# Patient Record
Sex: Female | Born: 1945 | Race: White | Hispanic: No | Marital: Single | Smoking: Former smoker
Health system: Southern US, Community
[De-identification: ages and names within clinical notes are randomized; demographics above are authoritative.]

## PROBLEM LIST (undated history)

## (undated) DIAGNOSIS — R519 Headache, unspecified: Secondary | ICD-10-CM

## (undated) DIAGNOSIS — J189 Pneumonia, unspecified organism: Secondary | ICD-10-CM

## (undated) DIAGNOSIS — T884XXA Failed or difficult intubation, initial encounter: Secondary | ICD-10-CM

## (undated) DIAGNOSIS — I341 Nonrheumatic mitral (valve) prolapse: Secondary | ICD-10-CM

## (undated) DIAGNOSIS — G8929 Other chronic pain: Secondary | ICD-10-CM

## (undated) DIAGNOSIS — Z9889 Other specified postprocedural states: Secondary | ICD-10-CM

## (undated) DIAGNOSIS — M259 Joint disorder, unspecified: Secondary | ICD-10-CM

## (undated) DIAGNOSIS — Z973 Presence of spectacles and contact lenses: Secondary | ICD-10-CM

## (undated) DIAGNOSIS — R112 Nausea with vomiting, unspecified: Secondary | ICD-10-CM

## (undated) DIAGNOSIS — I10 Essential (primary) hypertension: Secondary | ICD-10-CM

## (undated) DIAGNOSIS — T8859XA Other complications of anesthesia, initial encounter: Secondary | ICD-10-CM

## (undated) DIAGNOSIS — E039 Hypothyroidism, unspecified: Secondary | ICD-10-CM

## (undated) DIAGNOSIS — C801 Malignant (primary) neoplasm, unspecified: Secondary | ICD-10-CM

## (undated) DIAGNOSIS — G4730 Sleep apnea, unspecified: Secondary | ICD-10-CM

## (undated) DIAGNOSIS — R51 Headache: Secondary | ICD-10-CM

## (undated) DIAGNOSIS — T4145XA Adverse effect of unspecified anesthetic, initial encounter: Secondary | ICD-10-CM

## (undated) HISTORY — PX: ANKLE SURGERY: SHX546

## (undated) HISTORY — PX: APPENDECTOMY: SHX54

## (undated) HISTORY — DX: Headache: R51

## (undated) HISTORY — PX: KNEE SURGERY: SHX244

## (undated) HISTORY — PX: FRACTURE SURGERY: SHX138

## (undated) HISTORY — PX: ABDOMINAL HYSTERECTOMY: SHX81

## (undated) HISTORY — PX: MANDIBLE FRACTURE SURGERY: SHX706

## (undated) HISTORY — PX: DILATION AND CURETTAGE OF UTERUS: SHX78

## (undated) HISTORY — DX: Headache, unspecified: R51.9

---
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
## 1997-05-21 ENCOUNTER — Other Ambulatory Visit: Admission: RE | Admit: 1997-05-21 | Discharge: 1997-05-21 | Payer: Self-pay | Admitting: Orthopedic Surgery

## 1998-02-23 ENCOUNTER — Emergency Department (HOSPITAL_COMMUNITY): Admission: EM | Admit: 1998-02-23 | Discharge: 1998-02-24 | Payer: Self-pay | Admitting: Emergency Medicine

## 1998-02-23 ENCOUNTER — Encounter: Payer: Self-pay | Admitting: Emergency Medicine

## 1998-02-25 ENCOUNTER — Emergency Department (HOSPITAL_COMMUNITY): Admission: EM | Admit: 1998-02-25 | Discharge: 1998-02-25 | Payer: Self-pay | Admitting: Emergency Medicine

## 1998-02-25 ENCOUNTER — Encounter: Payer: Self-pay | Admitting: Emergency Medicine

## 1998-11-03 ENCOUNTER — Other Ambulatory Visit: Admission: RE | Admit: 1998-11-03 | Discharge: 1998-11-03 | Payer: Self-pay | Admitting: *Deleted

## 1999-08-28 ENCOUNTER — Other Ambulatory Visit: Admission: RE | Admit: 1999-08-28 | Discharge: 1999-08-28 | Payer: Self-pay | Admitting: Orthopedic Surgery

## 2001-08-03 ENCOUNTER — Encounter: Admission: RE | Admit: 2001-08-03 | Discharge: 2001-10-10 | Payer: Self-pay

## 2005-09-17 ENCOUNTER — Emergency Department (HOSPITAL_COMMUNITY): Admission: EM | Admit: 2005-09-17 | Discharge: 2005-09-18 | Payer: Self-pay | Admitting: Emergency Medicine

## 2008-09-11 ENCOUNTER — Emergency Department (HOSPITAL_COMMUNITY): Admission: EM | Admit: 2008-09-11 | Discharge: 2008-09-11 | Payer: Self-pay | Admitting: Emergency Medicine

## 2008-09-15 ENCOUNTER — Emergency Department (HOSPITAL_COMMUNITY): Admission: EM | Admit: 2008-09-15 | Discharge: 2008-09-15 | Payer: Self-pay | Admitting: Emergency Medicine

## 2009-01-22 ENCOUNTER — Encounter: Payer: Self-pay | Admitting: Cardiovascular Disease

## 2009-01-24 ENCOUNTER — Encounter (INDEPENDENT_AMBULATORY_CARE_PROVIDER_SITE_OTHER): Payer: Self-pay | Admitting: *Deleted

## 2009-01-29 DIAGNOSIS — I10 Essential (primary) hypertension: Secondary | ICD-10-CM | POA: Insufficient documentation

## 2009-01-29 DIAGNOSIS — M79609 Pain in unspecified limb: Secondary | ICD-10-CM | POA: Insufficient documentation

## 2009-02-04 ENCOUNTER — Ambulatory Visit: Payer: Self-pay | Admitting: Cardiovascular Disease

## 2009-02-07 ENCOUNTER — Encounter (INDEPENDENT_AMBULATORY_CARE_PROVIDER_SITE_OTHER): Payer: Self-pay | Admitting: *Deleted

## 2009-02-10 ENCOUNTER — Ambulatory Visit: Payer: Self-pay | Admitting: Gastroenterology

## 2009-02-27 ENCOUNTER — Telehealth (INDEPENDENT_AMBULATORY_CARE_PROVIDER_SITE_OTHER): Payer: Self-pay | Admitting: *Deleted

## 2009-03-03 ENCOUNTER — Ambulatory Visit: Payer: Self-pay

## 2009-03-03 ENCOUNTER — Encounter (HOSPITAL_COMMUNITY): Admission: RE | Admit: 2009-03-03 | Discharge: 2009-05-13 | Payer: Self-pay | Admitting: Cardiovascular Disease

## 2009-03-03 ENCOUNTER — Encounter: Payer: Self-pay | Admitting: Cardiovascular Disease

## 2009-03-03 ENCOUNTER — Ambulatory Visit: Payer: Self-pay | Admitting: Cardiovascular Disease

## 2009-03-03 ENCOUNTER — Ambulatory Visit (HOSPITAL_COMMUNITY): Admission: RE | Admit: 2009-03-03 | Discharge: 2009-03-03 | Payer: Self-pay | Admitting: Cardiovascular Disease

## 2009-03-10 ENCOUNTER — Ambulatory Visit: Payer: Self-pay | Admitting: Cardiovascular Disease

## 2009-03-10 DIAGNOSIS — I10 Essential (primary) hypertension: Secondary | ICD-10-CM

## 2010-02-10 NOTE — Letter (Signed)
Summary: Previsit letter  [facility]  [address] [place], [place] [address]   Phone: [phone]  Fax: [phone]       01/24/2009 MRN: [id number]  Angela [name] [address] [place], [place]  [address]  Dear [name],  Welcome to the Gastroenterology Division at [facility].    You are scheduled to see a nurse for your pre-procedure visit on 02-07-09 at 9am on the 3rd floor at [facility], [address]  We ask that you try to arrive at our office 15 minutes prior to your appointment time to allow for check-in.  Your nurse visit will consist of discussing your medical and surgical history, your immediate family medical history, and your medications.    Please bring a complete list of all your medications or, if you prefer, bring the medication bottles and we will list them.  We will need to be aware of both prescribed and over the counter drugs.  We will need to know exact dosage information as well.  If you are on blood thinners (Coumadin, Plavix, Aggrenox, Ticlid, etc.) please call our office today/prior to your appointment, as we need to consult with your physician about holding your medication.   Please be prepared to read and sign documents such as consent forms, a financial agreement, and acknowledgement forms.  If necessary, and with your consent, a friend or relative is welcome to sit-in on the nurse visit with you.  Please bring your insurance card so that we may make a copy of it.  If your insurance requires a referral to see a specialist, please bring your referral form from your primary care physician.  No co-pay is required for this nurse visit.     If you cannot keep your appointment, please call [phone] to cancel or reschedule prior to your appointment date.  This allows [name] the opportunity to schedule an appointment for another patient in need of care.    Thank you for choosing [facility] for your medical needs.   We appreciate the opportunity to care for you.  Please visit [name] at our website  to learn more about our practice.                     Sincerely.                                                                                                                   The Gastroenterology Division

## 2010-02-10 NOTE — Assessment & Plan Note (Signed)
Summary: np6/htn/uncontrolled/strong family hx of MI/eval for stress t...   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  History of Present Illness: 65 yo WF with history of multiple jaw surgereis who has recently diagnosed HTN and strong family history of CAD who is referred today for cardiac evaluation. She describes chronic jaw pain. She has also noticed left sided back pain and arm pain with exertion. No associated SOB, diaphoresis, nausea or vomiting, dizziness. NO other complaints. She was found to have elevated blood pressure while seeing Dr. Perrin Maltese two weeks ago and was started on Lisinopril which she started taking 6-7 days ago. She has never been treated for HTN in the past.   She is concerned about the upper back pain and arm pain with exertion as her brother had similar symptoms and died of a massive MI.   Current Medications (verified): 1)  Lisinopril 10 Mg Tabs (Lisinopril) .Marland Kitchen.. 1 Tab Once Daily 2)  Hydrocodone-Acetaminophen 5-500 Mg Tabs (Hydrocodone-Acetaminophen) .... As Needed  Allergies (verified): 1)  ! * Antiflammatories  Past History:  Past Medical History: HYPERTENSION-recent diagnosis 1/11 Chronic jaw pain s/p multiple surgeries.      Past Surgical History: 19 jaw surgeries Hysterectomy D &C x 3  Family History: Mother deceased breast cancer age 70 Father deceased aneurysm age 65 Brother MI age 46  Social History: Reviewed history from 01/29/2009 and no changes required. Tobacco Use - No.  Alcohol Use - yes. 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   Review of Systems       The patient complains of joint pain and chest pain.  The patient denies fatigue, malaise, fever, weight  gain/loss, vision loss, decreased hearing, hoarseness, palpitations, shortness of breath, prolonged cough, wheezing, sleep apnea, coughing up blood, abdominal pain, blood in stool, nausea, vomiting, diarrhea, heartburn, incontinence, blood in urine, muscle weakness, leg swelling, rash, skin lesions, headache, fainting, dizziness, depression, anxiety, enlarged lymph nodes, easy bruising or bleeding, and environmental allergies.         See HPI.   Vital Signs:  Patient profile:   65 year old female Height:      66 inches Weight:      144 pounds BMI:     23.33 Pulse rate:   56 / minute Pulse rhythm:   irregular BP sitting:   162 / 90  (left arm) Cuff size:   large  Vitals Entered By: Danielle Rankin, CMA (February 04, 2009 2:49 PM)  Physical Exam  General:  General: Well developed, well nourished, NAD HEENT: OP clear, mucus membranes moist SKIN: warm, dry Neuro: No focal deficits Musculoskeletal: Muscle strength 5/5 all ext Psychiatric: Mood and affect normal Neck: No JVD, no carotid bruits, no thyromegaly, no lymphadenopathy. Lungs:Clear bilaterally, no wheezes, rhonci, crackles CV: RRR no murmurs, gallops rubs Abdomen: soft, NT, ND, BS present Extremities: No edema, pulses 2+.    EKG  Procedure date:  02/04/2009  Findings:      Sinus bradycardia, rate 56 bpm. Incomplete RBBB. Poor R wave progression through precordial leads. Cannot exclude prior septal infarct.  Impression & Recommendations:  Problem # 1:  HYPERTENSION, UNCONTROLLED (ICD-401.9)  I have ordered a renal artery u/s to exclude renovascular  disease as the cause of her HTN which is new in onset. Will check echo to assess LV size and function. She would likely benefit from titration of the Ace-inhibitor with addition of HCTZ 25 mg QDaily. I will leave the titration of medications to Dr. Perrin Maltese. She is supposed to see him tomorrow.  Her updated medication list for this problem includes:    Lisinopril 10 Mg Tabs  (Lisinopril) .Marland Kitchen... 1 tab once daily  Orders: EKG w/ Interpretation (93000) Nuclear Stress Test (Nuc Stress Test) Echocardiogram (Echo) Renal Artery Duplex (Renal Artery Duplex)  Problem # 2:  ARM PAIN, LEFT (ICD-729.5) With strong family history of CAD and h/o HTN and the abnormal EKG, will get exercise myoview to assess for ischemia.    Patient Instructions: 1)  Your physician recommends that you schedule a follow-up appointment in: 3-4 weeks 2)  Your physician has requested that you have a renal artery duplex. During this test, an ultrasound is used to evaluate blood flow to the kidneys. Allow one hour for this exam. Do not eat after midnight the day before and avoid carbonated beverages. Take your medications as you usually do. 3)  Your physician has requested that you have an echocardiogram.  Echocardiography is a painless test that uses sound waves to create images of your heart. It provides your doctor with information about the size and shape of your heart and how well your heart's chambers and valves are working.  This procedure takes approximately one hour. There are no restrictions for this procedure. 4)  Your physician has requested that you have an exercise stress myoview.  For further information please visit [email or web address]  Please follow instruction sheet, as given.

## 2010-02-10 NOTE — Assessment & Plan Note (Signed)
Summary: per checkout/saf   Visit Type:  Follow-up Primary Provider:  Robert Bellow  CC:  pt states he left arm acts up all the time says she carries a heavy breifcase all the tim and but states her fingers went numb...Marland KitchenMarland KitchenMarland Kitchenedema/legs.  History of Present Illness: 65 yo WF with history of multiple jaw surgereis who has recently diagnosed HTN and strong family history of CAD who was  referred several weeks ago for cardiac evaluation. She described chronic jaw pain. She has also noticed left sided back pain and arm pain with exertion. No associated SOB, diaphoresis, nausea or vomiting, dizziness.  She has recently been placed on an Ace-inhibitor for control of blood pressure. She is here today to review her stress test and echo results. We also ordered a renal artery doppler with her recent onset of HTN.   She has had no recurrence of chest pain or SOB. She has had some left arm pain which she thinks is from carrying her briefcase. She continues to have severe chronic jaw pain.   Current Medications (verified): 1)  Lisinopril-Hydrochlorothiazide 10-12.5 Mg Tabs (Lisinopril-Hydrochlorothiazide) .Marland Kitchen.. 1 Tab Once Daily 2)  Hydrocodone-Acetaminophen 5-500 Mg Tabs (Hydrocodone-Acetaminophen) .... As Needed 3)  Moviprep 100 Gm  Solr (Peg-Kcl-Nacl-Nasulf-Na Asc-C) .... As Per Prep Instructions. 4)  Aspirin 81 Mg Tbec (Aspirin) .... Take One Tablet By Mouth Daily 5)  Multivitamins   Tabs (Multiple Vitamin) .Marland Kitchen.. 1 Tab Once Daily  Allergies: 1)  ! * Antiflammatories  Past History:  Past Medical History: Reviewed history from 02/04/2009 and no changes required. HYPERTENSION-recent diagnosis 1/11 Chronic jaw pain s/p multiple surgeries.      Social History: Reviewed history from 02/04/2009 and no changes required. Tobacco Use - No.  Alcohol Use - yes. 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   Review of  Systems       The patient complains of joint pain.  The patient denies fatigue, malaise, fever, weight gain/loss, vision loss, decreased hearing, hoarseness, chest pain, palpitations, shortness of breath, prolonged cough, wheezing, sleep apnea, coughing up blood, abdominal pain, blood in stool, nausea, vomiting, diarrhea, heartburn, incontinence, blood in urine, muscle weakness, leg swelling, rash, skin lesions, headache, fainting, dizziness, depression, anxiety, enlarged lymph nodes, easy bruising or bleeding, and environmental allergies.    Vital Signs:  Patient profile:   65 year old female Height:      66 inches Weight:      136 pounds BMI:     22.03 Pulse rate:   71 / minute Pulse rhythm:   regular BP sitting:   125 / 84  (left arm) Cuff size:   large  Vitals Entered By: Danielle Rankin, CMA (March 10, 2009 9:06 AM)  Physical Exam  General:  General: Well developed, well nourished, NAD Musculoskeletal: Muscle strength 5/5 all ext Psychiatric: Mood and affect normal Neck: No JVD, no carotid bruits, no thyromegaly, no lymphadenopathy. Lungs:Clear bilaterally, no wheezes, rhonci, crackles CV: RRR no murmurs, gallops rubs Abdomen: soft, NT, ND, BS present Extremities: No edema, pulses 2+.    Nuclear ETT  Procedure date:  03/03/2009  Findings:      The patient exercised for nine minutes.  The patient stopped due to fatigue.  She did c/o chest heaviness, 3/10, with exercise.  There were no diagnostic ST-T wave changes, only occasional PAC's and PVC's with rare couplet.  Myoview was injected at peak exercise and myocardial perfusion  imaging was performed after a brief delay.  QPS  Raw Data Images:  Normal; no motion artifact; normal heart/lung ratio. Stress Images:  NI: Uniform and normal uptake of tracer in all myocardial segments. Rest Images:  Normal homogeneous uptake in all areas of the myocardium. Subtraction (SDS):  Normal Transient Ischemic Dilatation:  1.03  (Normal  <1.22)  Lung/Heart Ratio:  .23  (Normal <0.45)  Quantitative Gated Spect Images  QGS EDV:  66 ml QGS ESV:  18 ml QGS EF:  73 % QGS cine images:  normal  Findings  Normal nuclear study  Echocardiogram  Procedure date:  03/03/2009  Findings:      - Left ventricle: The cavity size was normal. Systolic function was       normal. The estimated ejection fraction was in the range of 55% to       60%. Wall motion was normal; there were no regional wall motion       abnormalities.     - Mitral valve: Mild regurgitation.     - Pericardium, extracardiac: A trivial pericardial effusion was       identified.  Arterial Doppler  Procedure date:  03/03/2009  Findings:      Normal kidney size bilaterally. Normal renal arteries.   Impression & Recommendations:  Problem # 1:  ARM PAIN, LEFT (ICD-729.5) Most likely musculoskeletal. No recurrence of any chest pain. Stress test with good exercise tolerance and no ischemia. Echo with no significant valvular issues. Normal LV function. No further cardiac workup at this time.   Problem # 2:  HYPERTENSION, BENIGN (ICD-401.1) Controlled on current therapy. No evidence of renal artery stenosis in dopplers.   Her updated medication list for this problem includes:    Lisinopril-hydrochlorothiazide 10-12.5 Mg Tabs (Lisinopril-hydrochlorothiazide) .Marland Kitchen... 1 tab once daily    Aspirin 81 Mg Tbec (Aspirin) .Marland Kitchen... Take one tablet by mouth daily  Patient Instructions: 1)  Your physician recommends that you schedule a follow-up appointment as needed

## 2010-02-10 NOTE — Miscellaneous (Signed)
Summary: [facility] Previsit/prep  Clinical Lists Changes  Medications: Added new medication of MOVIPREP 100 GM  SOLR (PEG-KCL-NACL-NASULF-NA ASC-C) As per prep instructions. - Signed Rx of MOVIPREP 100 GM  SOLR (PEG-KCL-NACL-NASULF-NA ASC-C) As per prep instructions.;  #1 x 0;  Signed;  Entered by: [name] RN;  Authorized by: Louis Meckel MD;  Method used: Electronically to [facility] [phone] [place], [place], [place]  [address], Ph: [phone], Fax: [phone] Allergies: Changed allergy or adverse reaction from * ANTIFLAMMATORIES to * ANTIFLAMMATORIES Observations: Added new observation of ALLERGY REV: Done (02/10/2009 13:36)    Prescriptions: MOVIPREP 100 GM  SOLR (PEG-KCL-NACL-NASULF-NA ASC-C) As per prep instructions.  #1 x 0   Entered by:   [name] RN   Authorized by:   Louis Meckel MD   Signed by:   [name] RN on 02/10/2009   Method used:   Electronically to        [facility] [address] (retail)       [phone]       [place], [place]  [address]       Ph: [phone]       Fax: [phone]   RxID:   [id number]

## 2010-02-10 NOTE — Letter (Signed)
Summary: [facility] Instructions  [facility]  [address] [place], [place] [address]   Phone: [phone]  Fax: [phone]       Angela [name]    Feb 26, 1945    MRN: [id number]        Procedure Day /Date:  Friday 02/21/09     Arrival Time:  7:30am     Procedure Time:  8:30am     Location of Procedure:                    [facility] (4th Floor)                        PREPARATION FOR COLONOSCOPY WITH MOVIPREP   Starting 5 days prior to your procedure   Sunday 02/06  do not eat nuts, seeds, popcorn, corn, beans, peas,  salads, or any raw vegetables.  Do not take any fiber supplements (e.g. Metamucil, Citrucel, and Benefiber).  THE DAY BEFORE YOUR PROCEDURE         DATE:  02/10  DAY: Thursday  1.  Drink clear liquids the entire day-NO SOLID FOOD  2.  Do not drink anything colored red or purple.  Avoid juices with pulp.  No orange juice.  3.  Drink at least 64 oz. (8 glasses) of fluid/clear liquids during the day to prevent dehydration and help the prep work efficiently.  CLEAR LIQUIDS INCLUDE: Water Jello Ice Popsicles Tea (sugar ok, no milk/cream) Powdered fruit flavored drinks Coffee (sugar ok, no milk/cream) Gatorade Juice: apple, white grape, white cranberry  Lemonade Clear bullion, consomm, broth Carbonated beverages (any kind) Strained chicken noodle soup Hard Candy                             4.  In the morning, mix first dose of MoviPrep solution:    Empty 1 Pouch A and 1 Pouch B into the disposable container    Add lukewarm drinking water to the top line of the container. Mix to dissolve    Refrigerate (mixed solution should be used within 24 hrs)  5.  Begin drinking the prep at 5:00 p.m. The MoviPrep container is divided by 4 marks.   Every 15 minutes drink the solution down to the next mark (approximately 8 oz) until the full liter is complete.   6.  Follow completed prep with 16 oz of clear liquid of your choice  (Nothing red or purple).  Continue to drink clear liquids until bedtime.  7.  Before going to bed, mix second dose of MoviPrep solution:    Empty 1 Pouch A and 1 Pouch B into the disposable container    Add lukewarm drinking water to the top line of the container. Mix to dissolve    Refrigerate  THE DAY OF YOUR PROCEDURE      DATE:  02/11 DAY: Friday  Beginning at  3:30 a.m. (5 hours before procedure):         1. Every 15 minutes, drink the solution down to the next mark (approx 8 oz) until the full liter is complete.  2. Follow completed prep with 16 oz. of clear liquid of your choice.    3. You may drink clear liquids until 6:30am  (2 HOURS BEFORE PROCEDURE).   MEDICATION INSTRUCTIONS  Unless otherwise instructed, you should take regular prescription medications with a small sip of water  as early as possible the morning of your procedure.         OTHER INSTRUCTIONS  You will need a responsible adult at least 64 years of age to accompany you and drive you home.   This person must remain in the waiting room during your procedure.  Wear loose fitting clothing that is easily removed.  Leave jewelry and other valuables at home.  However, you may wish to bring a book to read or  an iPod/MP3 player to listen to music as you wait for your procedure to start.  Remove all body piercing jewelry and leave at home.  Total time from sign-in until discharge is approximately 2-3 hours.  You should go home directly after your procedure and rest.  You can resume normal activities the  day after your procedure.  The day of your procedure you should not:   Drive   Make legal decisions   Operate machinery   Drink alcohol   Return to work  You will receive specific instructions about eating, activities and medications before you leave.    The above instructions have been reviewed and explained to me by   [name] RN  February 10, 2009 2:15 PM     I fully  understand and can verbalize these instructions _____________________________ Date _________

## 2010-02-10 NOTE — Progress Notes (Signed)
Summary: Nuclear Pre-Procedure  Phone Note Outgoing Call Call back at [facility] Phone [phone]   Call placed by: Stanton Kidney, EMT-P,  February 27, 2009 2:19 PM Action Taken: Phone Call Completed Summary of Call: Left message with information on Myoview Information Sheet (see scanned document for details).      Nuclear Med Background Indications for Stress Test: Evaluation for Ischemia   History: Myocardial Perfusion Study  History Comments: '98 MPS: ([facility]-no report)     Nuclear Pre-Procedure Cardiac Risk Factors: Family History - CAD, Hypertension, RBBB Height (in): 66

## 2010-02-10 NOTE — Assessment & Plan Note (Signed)
Summary: Cardiology Nuclear Study  Nuclear Med Background Indications for Stress Test: Evaluation for Ischemia   History: Myocardial Perfusion Study  History Comments: '98 MPS:OK per patient  Symptoms: Fatigue, Palpitations  Symptoms Comments: c/o back pain and (L) arm numbness. Last episode of back pain:this am   Nuclear Pre-Procedure Cardiac Risk Factors: Family History - CAD, History of Smoking, Hypertension, RBBB Caffeine/Decaff Intake: None NPO After: 8:00 PM Lungs: Clear IV 0.9% NS with Angio Cath: 20g     IV Site: (R) AC IV Started by: Stanton Kidney EMT-P Chest Size (in) 38     Cup Size DD     Height (in): 66 Weight (lb): 137 BMI: 22.19  Nuclear Med Study 1 or 2 day study:  1 day     Stress Test Type:  Stress Reading MD:  Charlton Haws, MD     Referring MD:  Verne Carrow, MD Resting Radionuclide:  Technetium 30m Tetrofosmin     Resting Radionuclide Dose:  11.0 mCi  Stress Radionuclide:  Technetium 24m Tetrofosmin     Stress Radionuclide Dose:  33.0 mCi   Stress Protocol Exercise Time (min):  9:00 min     Max HR:  157 bpm     Predicted Max HR:  158 bpm  Max Systolic BP: 189 mm Hg     Percent Max HR:  99.37 %     METS: 10.4 Rate Pressure Product:  47829    Stress Test Technologist:  Rea College CMA-N     Nuclear Technologist:  Burna Mortimer Deal RT-N  Rest Procedure  Myocardial perfusion imaging was performed at rest 45 minutes following the intravenous administration of Myoview Technetium 64m Tetrofosmin.  Stress Procedure  The patient exercised for nine minutes.  The patient stopped due to fatigue.  She did c/o chest heaviness, 3/10, with exercise.  There were no diagnostic ST-T wave changes, only occasional PAC's and PVC's with rare couplet.  Myoview was injected at peak exercise and myocardial perfusion imaging was performed after a brief delay.  QPS Raw Data Images:  Normal; no motion artifact; normal heart/lung ratio. Stress Images:  NI: Uniform and normal  uptake of tracer in all myocardial segments. Rest Images:  Normal homogeneous uptake in all areas of the myocardium. Subtraction (SDS):  Normal Transient Ischemic Dilatation:  1.03  (Normal <1.22)  Lung/Heart Ratio:  .23  (Normal <0.45)  Quantitative Gated Spect Images QGS EDV:  66 ml QGS ESV:  18 ml QGS EF:  73 % QGS cine images:  normal  Findings Normal nuclear study      Overall Impression  Exercise Capacity: Good exercise capacity. BP Response: Normal blood pressure response. Clinical Symptoms: Chest Heavy ECG Impression: No significant ST segment change suggestive of ischemia. Overall Impression: Normal stress nuclear study. Overall Impression Comments: Normal  Appended Document: Cardiology Nuclear Study REviewed and ok. Can we call about this and echo? cdm  Appended Document: Cardiology Nuclear Study Dr. Clifton James reviewed with pt at appt. 03/10/09

## 2010-03-12 ENCOUNTER — Encounter (HOSPITAL_COMMUNITY)
Admission: RE | Admit: 2010-03-12 | Discharge: 2010-03-12 | Disposition: A | Discharge status: Home / Self Care | Payer: BC Managed Care – PPO | Source: Ambulatory Visit | Attending: Orthopedic Surgery | Admitting: Orthopedic Surgery

## 2010-03-12 ENCOUNTER — Other Ambulatory Visit (HOSPITAL_COMMUNITY): Payer: Self-pay | Admitting: Orthopedic Surgery

## 2010-03-12 ENCOUNTER — Ambulatory Visit (HOSPITAL_COMMUNITY)
Admission: RE | Admit: 2010-03-12 | Discharge: 2010-03-12 | Disposition: A | Discharge status: Home / Self Care | Payer: BC Managed Care – PPO | Source: Ambulatory Visit | Attending: Orthopedic Surgery | Admitting: Orthopedic Surgery

## 2010-03-12 ENCOUNTER — Telehealth (INDEPENDENT_AMBULATORY_CARE_PROVIDER_SITE_OTHER): Payer: Self-pay | Admitting: *Deleted

## 2010-03-12 DIAGNOSIS — Z01818 Encounter for other preprocedural examination: Secondary | ICD-10-CM | POA: Insufficient documentation

## 2010-03-12 DIAGNOSIS — Z01810 Encounter for preprocedural cardiovascular examination: Secondary | ICD-10-CM | POA: Insufficient documentation

## 2010-03-12 DIAGNOSIS — M19071 Primary osteoarthritis, right ankle and foot: Secondary | ICD-10-CM

## 2010-03-12 DIAGNOSIS — Z01812 Encounter for preprocedural laboratory examination: Secondary | ICD-10-CM | POA: Insufficient documentation

## 2010-03-12 DIAGNOSIS — I10 Essential (primary) hypertension: Secondary | ICD-10-CM | POA: Insufficient documentation

## 2010-03-12 LAB — COMPREHENSIVE METABOLIC PANEL
Albumin: 4.5 g/dL (ref 3.5–5.2)
Alkaline Phosphatase: 41 U/L (ref 39–117)
BUN: 13 mg/dL (ref 6–23)
Chloride: 102 mEq/L (ref 96–112)
Glucose, Bld: 87 mg/dL (ref 70–99)
Potassium: 4.4 mEq/L (ref 3.5–5.1)
Total Bilirubin: 0.8 mg/dL (ref 0.3–1.2)

## 2010-03-12 LAB — SURGICAL PCR SCREEN: MRSA, PCR: NEGATIVE

## 2010-03-12 LAB — CBC
HCT: 37 % (ref 36.0–46.0)
Hemoglobin: 12.7 g/dL (ref 12.0–15.0)
MCH: 32.8 pg (ref 26.0–34.0)
MCHC: 34.3 g/dL (ref 30.0–36.0)

## 2010-03-13 ENCOUNTER — Ambulatory Visit (HOSPITAL_COMMUNITY)
Admission: RE | Admit: 2010-03-13 | Discharge: 2010-03-13 | Disposition: A | Discharge status: Home / Self Care | Payer: BC Managed Care – PPO | Source: Ambulatory Visit | Attending: Orthopedic Surgery | Admitting: Orthopedic Surgery

## 2010-03-13 DIAGNOSIS — I10 Essential (primary) hypertension: Secondary | ICD-10-CM | POA: Insufficient documentation

## 2010-03-13 DIAGNOSIS — M24873 Other specific joint derangements of unspecified ankle, not elsewhere classified: Secondary | ICD-10-CM | POA: Insufficient documentation

## 2010-03-13 DIAGNOSIS — IMO0001 Reserved for inherently not codable concepts without codable children: Secondary | ICD-10-CM | POA: Insufficient documentation

## 2010-03-13 DIAGNOSIS — M24876 Other specific joint derangements of unspecified foot, not elsewhere classified: Secondary | ICD-10-CM | POA: Insufficient documentation

## 2010-03-13 DIAGNOSIS — Z01812 Encounter for preprocedural laboratory examination: Secondary | ICD-10-CM | POA: Insufficient documentation

## 2010-03-19 NOTE — Progress Notes (Signed)
Summary: Records Request  Faxed OV, EKG, Stress & Echo to Clydie Braun (per [place]) at [facility] ([phone]). Debby Freiberg  March 12, 2010 1:46 PM

## 2010-03-29 NOTE — Op Note (Signed)
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  PROCEDURE: 1. GOLD modified Brostrom reconstruction of the lateral ankle     ligaments, right ankle. 2. Arthroscopic debridement and impingement, right ankle.  SURGEON:  Nadara Mustard, MD  ANESTHESIA:  Popliteal block.  ESTIMATED BLOOD LOSS:  Minimal.  ANTIBIOTICS:  Kefzol 1 g.  DRAINS:  None.  COMPLICATIONS:  None.  DISPOSITION:  To PACU in stable condition.  INDICATIONS FOR PROCEDURE:  The patient is a 65 year old woman with chronic lateral ankle instability.  She has pain with activities of daily living, has failed conservative care, and presents at this time for arthroscopic intervention for debridement of the impingement of the ankle as well as lateral ankle reconstruction.  Risks and benefits were discussed including infection, neurovascular injury, persistent pain, need for additional surgery.  The patient states she understands and wish to proceed at this time.  DESCRIPTION OF PROCEDURE:  The patient was brought to OR room 10 after undergoing a popliteal block.  After adequate level of anesthesia was obtained, the patient's right lower extremity was prepped using DuraPrep and draped into a sterile field.  Attention was first focused over the anterior talofibular ligament, and a curvilinear incision was made over the distal border of the anterior fibula.   This was carried bluntly down.  The extensor retinaculum was retracted.  The anterior talofibular ligament was incised and was then imbricated using 2-0 Ethibond.  After this was stabilized, the extensor ligament was then pulled over the repair and this was sutured in place with the 2-0 Ethibond as well. Previous reconstruction, the patient had positive anterior drawer.  Post reconstruction, she had about 2 mm of an anterior drawer.  The wound was irrigated with normal saline, and the skin was closed with modified vertical mattress suture with 2-0 nylon.  Attention was then focused on the ankle.  The scope was inserted through the anteromedial portal and anterolateral portal was established.  The skin was incised.  Blunt dissection was carried down to capsular and a blunt trocar was used to enter the joint.  On examination, the patient had a significant amount of synovitis anteriorly.  This was debrided with the ArthroCare wand. There was no evidence of any articular cartilage defects.  The medial and lateral gutters were cleansed.  Anterior aspect of the joint was cleansed.  The instruments were removed.  The portals were closed using 4-0 nylon.  The wounds were covered with Adaptic orthopedic sponges, Kerlix, and Coban.  The patient was then taken to PACU in stable condition.  She was given a fracture boot,  crutches, prescription for Tylox for pain, ice and elevation, weightbearing as tolerated, follow up in the office in 1 week.     Nadara Mustard, MD     MVD/MEDQ  D:  03/13/2010  T:  03/14/2010  Job:  [id number]  Electronically Signed by Aldean Baker MD on 03/29/2010 04:31:28 PM

## 2010-04-17 LAB — BASIC METABOLIC PANEL
BUN: 9 mg/dL (ref 6–23)
Chloride: 105 mEq/L (ref 96–112)
GFR calc non Af Amer: >60 mL/min (ref >60)
Potassium: 3.9 mEq/L (ref 3.5–5.1)
Sodium: 140 mEq/L (ref 135–145)

## 2010-04-17 LAB — CBC
HCT: 39.3 % (ref 36.0–46.0)
Hemoglobin: 13.4 g/dL (ref 12.0–15.0)
MCV: 99.4 fL (ref 78.0–100.0)
Platelets: 251 10*3/uL (ref 150–400)
RBC: 3.96 MIL/uL (ref 3.87–5.11)
WBC: 6 10*3/uL (ref 4.0–10.5)

## 2010-04-17 LAB — POCT CARDIAC MARKERS
CKMB, poc: <1 ng/mL — ABNORMAL LOW (ref 1.0–8.0)
Myoglobin, poc: 44.4 ng/mL (ref 12–200)
Troponin i, poc: <0.05 ng/mL (ref 0.00–0.09)

## 2010-04-17 LAB — DIFFERENTIAL
Eosinophils Absolute: 0 10*3/uL (ref 0.0–0.7)
Eosinophils Relative: 1 % (ref 0–5)
Lymphocytes Relative: 31 % (ref 12–46)
Lymphs Abs: 1.9 10*3/uL (ref 0.7–4.0)
Monocytes Absolute: 0.5 10*3/uL (ref 0.1–1.0)
Monocytes Relative: 9 % (ref 3–12)

## 2010-05-29 NOTE — Consult Note (Signed)
Angela [name], NEAL NO.:  [id number]   MEDICAL RECORD NO.:  [id number]                   PATIENT TYPE:  REC   LOCATION:  TPC                                  FACILITY:  [facility]   PHYSICIAN:  Sondra Come, D.O.                 DATE OF BIRTH:  June 18, 1945   DATE OF CONSULTATION:  08/07/2001  DATE OF DISCHARGE:                  PHYSICAL MEDICINE & REHABILITATION CONSULTATION   Thank you very much for kindly referring the [facility] to [facility] for [facility].  The patient was seen in our  clinic today.  Please refer to the following for details regarding the  history, physical examination, and treatment recommendations.  Once again,  thank you for allowing [name] to participate in the care of her.   CHIEF COMPLAINT:  Pain, needs medications changed.   HISTORY OF PRESENT ILLNESS:  The patient is a pleasant 65 year old female  who has a long history of temporomandibular joint pain who is status post 19  surgeries per her report.  She has had chronic jaw, neck, and upper back  pain, but now complains of pain diffusely throughout her body at times.  She  is followed primarily by her primary care Angela [name], Dr. Robert Bellow.  She  states she was previously followed by [facility], Dr.  Daphine [name] who had been prescribing Neurontin and Ultram for her pain.  She has  been on a similar dose of each since 1995.  She currently takes Neurontin  800 mg q.i.d. and Ultram 50 mg q.i.d.  She is noting that the Ultram does  not last as long and when it wears off she tends to get pain throughout her  entire body.  She was referred by her primary care Angela [name] to Dr. [name]  who has thoroughly worked her up neurologically.  In addition, patient feels  like the medications she is currently on have caused her some memory  problems.  She has had appropriate laboratory work-up by her primary care  Angela [name] including a negative TSH,  ESR, ANA, rheumatoid factor.  She has had  multiple sessions of physical therapy previously following her surgeries.  Primarily, this was concentrated on her upper back and neck.  Her current  exercise program includes walking and some stretching.  She has not had any  other type of treatments including acupuncture and massage therapy.  She  rates her pain as a 9/10 on a subjective scale and describes it as constant,  throbbing, sharp and burning affecting her neck, low back, but at times  diffusely throughout her body including her upper and lower extremities.  Her function and quality of life indexes have declined.  Her sleep is poor.  She has tried Elavil 25 mg at bedtime, but was not able to tolerate it.  She  has also had trial of TENS unit and ultrasound in the past without any  significant improvement.  She is allergic to anti-inflammatory medication  which she states causes pseudo porphyria.  She denies bowel and bladder  dysfunction, but admits to unexplained fatigue, occasional nausea and  vomiting with severe pain, headaches, heart murmur, and mitral valve  prolapse.   PAST MEDICAL HISTORY:  Heart murmur, mitral valve prolapse.   PAST SURGICAL HISTORY:  Jaw surgery x19 with implants and rib graft per Dr.  Nonnie Done in [place], [place], hysterectomy, left knee surgery.   FAMILY HISTORY:  Heart disease, cancer, hypertension.   SOCIAL HISTORY:  The patient denies smoking.  Admits to occasional alcohol  use.  She denies drug use.  She is divorced and was working until June 22, 2001.  She previously was a Economist of a company and states that she had a  lot of stress involved with her job but she denies any traumatic life  experiences.   ALLERGIES:  ANTI-INFLAMMATORY MEDICATION.   MEDICATIONS:  1. Neurontin 800 mg q.i.d.  2. Ultram 50 mg q.i.d.  3. Multivitamin.   PHYSICAL EXAMINATION:  GENERAL:  Healthy female in no acute distress.  There  were no asymmetries  about the face.  BACK:  Also does not reveal any asymmetries.  She has full range of motion  of her cervical spine and lumbar spine with normal cervical and lumbar  lordosis.  Pelvis is level.  Shoulder height is level.  There is no  scoliosis noted.  MUSCULOSKELETAL:  There is multiple tender points throughout the body  including the suboccipital, anterior cervical, upper trapezius,  supraspinatus, gluteus medius, greater trochanters, medial knees.  There is  minimal tenderness bilateral lateral epicondyles and sternocostal region.  There is tenderness bilateral temporomandibular joint region.  No crepitus  noted upon opening and closing the mouth.  NEUROLOGIC:  Intact in the upper and lower extremities including motor which  is 5/5 in all muscle groups tested.  Sensory examination is intact to light  touch bilateral upper and lower extremities.  Muscle stretch reflexes are  2+/4 bilateral patellar, medial hamstrings, Achilles, biceps, triceps,  brachioradialis, pronator tares.  Straight leg raise is negative  bilaterally.  Spurling maneuver is negative bilaterally.  Lhermitte's is  negative.  FABER is negative bilaterally.  No heat, erythema, or edema in  the upper or lower extremities.   IMPRESSION:  1. Chronic soft tissue pain syndrome.  Today's examination is consistent     with possible fibromyalgia syndrome as she has diffuse tenderness     throughout the body.  Most of her pain, however, is located more     regionally in the head and neck and upper back.  2. Myofascial pain syndrome.  3. Nonrestorative sleep disorder.   PLAN:  1. Had a long discussion with Angela [name] regarding her pain complaints and     discussion of fibromyalgia syndrome.  We discussed treatment strategies     and expectations.  2. In terms of medications, we discussed possibly weaning her from the    Neurontin and Ultram all together which patient would like to do.  I have     instructed her to wean  Neurontin by one pill every week until she is off.     I have also instructed her to wean Ultram by one pill every three days.     I have counseled her to expect possible rebound pain from coming off of     the medication but I think it is appropriate to give her a trial off  of     these medications.  Neurontin is likely contributing to her memory     problems and would like to see how she does off of the medication.  3. In terms of sleep hygiene, will prescribe trazodone 100 mg one-half to     one p.o. q.h.s.  If patient is unable to tolerate this, would consider     changing to Ambien 5-10 mg at bedtime.  4. Will get patient involved in an aquatic therapy program for range of     motion, stretching, and low impact exercises leading to an independent     program.  This is typically quite helpful for people with fibromyalgia     syndrome.  5. Will refer patient to Dr. Alinda Dooms for a trial of acupuncture for     her TMJ and myofascial pain components.  6. Will refer patient to [facility] for biofeedback,     coping, and behavior modification, etc.  This was discussed at length and     she wished to proceed in this regard.  7. I do not feel like narcotic based analgesia is warranted at this time.     Opiate based pain medication is typically not indicated in fibromyalgia     syndrome or myofascial pain syndrome.  This was discussed with Ms. Mijangos     as well.  8. The patient is to follow up in one month for reevaluation.  9. The patient is instructed to follow up with her primary care Anaiah Mcmannis,     Dr. Perrin Maltese.   The patient was educated on the above findings and recommendations and  understands.  No barriers to communication.                                               Sondra Come, D.O.    JJW/MEDQ  D:  08/04/2001  T:  08/11/2001  Job:  [id number]   cc:   Marlan Palau, M.D.   Jonita Albee, M.D.

## 2010-07-17 ENCOUNTER — Inpatient Hospital Stay (HOSPITAL_COMMUNITY)
Admission: AD | Admit: 2010-07-17 | Discharge: 2010-07-21 | DRG: 219 | Disposition: A | Discharge status: Home / Self Care | Payer: BC Managed Care – PPO | Source: Ambulatory Visit | Attending: Orthopedic Surgery | Admitting: Orthopedic Surgery

## 2010-07-17 DIAGNOSIS — B951 Streptococcus, group B, as the cause of diseases classified elsewhere: Secondary | ICD-10-CM | POA: Diagnosis present

## 2010-07-17 DIAGNOSIS — M009 Pyogenic arthritis, unspecified: Principal | ICD-10-CM | POA: Diagnosis present

## 2010-07-17 DIAGNOSIS — M26609 Unspecified temporomandibular joint disorder, unspecified side: Secondary | ICD-10-CM | POA: Diagnosis present

## 2010-07-17 DIAGNOSIS — I10 Essential (primary) hypertension: Secondary | ICD-10-CM | POA: Diagnosis present

## 2010-07-17 LAB — COMPREHENSIVE METABOLIC PANEL
ALT: 12 U/L (ref 0–35)
AST: 16 U/L (ref 0–37)
CO2: 29 mEq/L (ref 19–32)
Calcium: 8.9 mg/dL (ref 8.4–10.5)
Creatinine, Ser: <0.47 mg/dL — ABNORMAL LOW (ref 0.50–1.10)
Sodium: 132 mEq/L — ABNORMAL LOW (ref 135–145)
Total Protein: 6.3 g/dL (ref 6.0–8.3)

## 2010-07-17 LAB — CBC
Hemoglobin: 11.5 g/dL — ABNORMAL LOW (ref 12.0–15.0)
MCV: 91.4 fL (ref 78.0–100.0)
Platelets: 278 10*3/uL (ref 150–400)
RBC: 3.48 MIL/uL — ABNORMAL LOW (ref 3.87–5.11)
RDW: 12.8 % (ref 11.5–15.5)
WBC: 10.4 10*3/uL (ref 4.0–10.5)

## 2010-07-17 LAB — PROTIME-INR
INR: 0.91 (ref 0.00–1.49)
Prothrombin Time: 12.4 seconds (ref 11.6–15.2)

## 2010-07-17 LAB — APTT: aPTT: 34 seconds (ref 24–37)

## 2010-07-17 LAB — SURGICAL PCR SCREEN: Staphylococcus aureus: NEGATIVE

## 2010-07-18 LAB — BASIC METABOLIC PANEL
Calcium: 8.8 mg/dL (ref 8.4–10.5)
Potassium: 3.3 mEq/L — ABNORMAL LOW (ref 3.5–5.1)
Sodium: 141 mEq/L (ref 135–145)

## 2010-07-18 LAB — CBC
HCT: 31.3 % — ABNORMAL LOW (ref 36.0–46.0)
MCV: 92.9 fL (ref 78.0–100.0)
Platelets: 229 10*3/uL (ref 150–400)
RBC: 3.37 MIL/uL — ABNORMAL LOW (ref 3.87–5.11)
RDW: 13.1 % (ref 11.5–15.5)
WBC: 6.2 10*3/uL (ref 4.0–10.5)

## 2010-07-18 LAB — PROTIME-INR: INR: 1.17 (ref 0.00–1.49)

## 2010-07-19 LAB — PROTIME-INR: INR: 1.23 (ref 0.00–1.49)

## 2010-07-19 LAB — CBC
HCT: 30.2 % — ABNORMAL LOW (ref 36.0–46.0)
Hemoglobin: 10.5 g/dL — ABNORMAL LOW (ref 12.0–15.0)
RBC: 3.21 MIL/uL — ABNORMAL LOW (ref 3.87–5.11)
RDW: 13.5 % (ref 11.5–15.5)
WBC: 5.3 10*3/uL (ref 4.0–10.5)

## 2010-07-19 LAB — WOUND CULTURE: Gram Stain: NONE SEEN

## 2010-07-19 LAB — BASIC METABOLIC PANEL
CO2: 34 mEq/L — ABNORMAL HIGH (ref 19–32)
Calcium: 8.6 mg/dL (ref 8.4–10.5)
Potassium: 3 mEq/L — ABNORMAL LOW (ref 3.5–5.1)
Sodium: 141 mEq/L (ref 135–145)

## 2010-07-20 LAB — CBC
Hemoglobin: 10.4 g/dL — ABNORMAL LOW (ref 12.0–15.0)
MCHC: 34.6 g/dL (ref 30.0–36.0)
RDW: 13.3 % (ref 11.5–15.5)
WBC: 4.4 10*3/uL (ref 4.0–10.5)

## 2010-07-20 LAB — BASIC METABOLIC PANEL
Glucose, Bld: 92 mg/dL (ref 70–99)
Potassium: 3 mEq/L — ABNORMAL LOW (ref 3.5–5.1)
Sodium: 142 mEq/L (ref 135–145)

## 2010-07-20 LAB — WOUND CULTURE

## 2010-07-20 LAB — PROTIME-INR
INR: 1.32 (ref 0.00–1.49)
Prothrombin Time: 16.6 seconds — ABNORMAL HIGH (ref 11.6–15.2)

## 2010-07-22 LAB — ANAEROBIC CULTURE: Gram Stain: NONE SEEN

## 2010-07-22 NOTE — Discharge Summary (Signed)
  Angela [name], DOW NO.:  [id number]  MEDICAL RECORD NO.:  [id number]  LOCATION:  5029                         FACILITY:  [facility]  PHYSICIAN:  Nadara Mustard, MD     DATE OF BIRTH:  [birth year]  DATE OF ADMISSION:  07/17/2010 DATE OF DISCHARGE:  07/21/2010                              DISCHARGE SUMMARY   FINAL DIAGNOSIS:  Septic right ankle.  SURGICAL PROCEDURE:  Irrigation and debridement, right ankle.  FINAL CULTURES:  Positive for Strep agalactiae.  DISCHARGE MEDICATIONS:  Include her admission medications plus cephalexin 500 mg p.o. t.i.d. for 1 month, Bactroban ointment to be applied to the wound daily with Dove soap cleansing.  A prescription for Percocet for pain.  Follow up in the office in 1 week.  HISTORY OF PRESENT ILLNESS:  The patient is 4 months status post ankle arthroscopy and lateral ankle reconstruction who developed a septic joint.  She was admitted, underwent irrigation and debridement, IVantibiotics with vanc and Zosyn.  Cultures were positive for Strep agalactiae.  She was discharged on Keflex.  The white cell count went from 10.4 to 4.4 during her hospital stay.  She had excellent resolution of her symptoms.  Plan to follow up in the office in 1 week.  Discharged to home in stable condition.     Nadara Mustard, MD     MVD/MEDQ  D:  07/21/2010  T:  07/21/2010  Job:  [id number]  Electronically Signed by Aldean Baker MD on 07/22/2010 09:40:50 AM

## 2010-07-22 NOTE — Op Note (Signed)
NAMEFATIMAH, SUNDQUIST NO.:  [id number]  MEDICAL RECORD NO.:  [id number]  LOCATION:  5029                         FACILITY:  [facility]  PHYSICIAN:  Nadara Mustard, MD     DATE OF BIRTH:  06-04-1945  DATE OF PROCEDURE:  07/17/2010 DATE OF DISCHARGE:                              OPERATIVE REPORT   PREOPERATIVE DIAGNOSIS:  Right ankle infection.  POSTOPERATIVE DIAGNOSIS:  Septic right ankle.  PROCEDURE:  Right ankle arthroscopy, irrigation and debridement of the lateral wound, irrigation and debridement of extensor tendons, irrigation of debridement of the ankle.  SURGEON:  Nadara Mustard, MD  ANESTHESIA:  Spinal.  ESTIMATED BLOOD LOSS:  Minimal.  ANTIBIOTICS:  Kefzol 2 g after cultures obtained.  CULTURES OBTAINED:  In the soft tissue x2 and intra-articular x2 with purulent fluid intra-articular.  DRAINS:  One Hemovac.  DISPOSITION:  To PACU in stable condition.  INDICATIONS FOR PROCEDURE:  The patient is a 65 year old woman who is 4 months status post right ankle arthroscopy.  She has had some clear drainage from the lateral portal.  The lateral incision has healed quite nicely from the lateral ankle reconstruction.  The patient states he has just been essentially asymptomatic; however, over the past 24 hours, she had acute pain, redness, swelling and presented at this time to the office.  There was a cellulitis laterally over the lateral malleolus and the patient was brought urgently for irrigation and debridement of the left ankle.  Risks and benefits were discussed including persistent infection, neurovascular injury, need for additional surgery.  The patient states she understands and wished to proceed at this time.  DESCRIPTION OF THE PROCEDURE:  The patient was brought to OR room 1, underwent spinal anesthetic.  After adequate level of anesthesia obtained, the patient's right lower extremity was prepped using DuraPrep and draped into sterile  field.  She had a small wound anterolaterally which had the clear ankle drainage, this was excised and blunt dissection was carried down to the extensor tendons.  There was some clear fluid in the subcu space and cultures were obtained, but there were no signs of purulence at this location.  Attention was then focused to the ankle and 18-gauge needle was inserted through the anteromedial portal.  Purulence was expressed from the ankle.  This was also sent for cultures x2.  The scope was inserted through the anteromedial portal with blunt dissection and a blunt trocar and the lateral wound was used for drainage and a shaver was used for synovectomy and 6 liters of normal saline were irrigated through the ankle joint.  No evidence of any cartilage destruction.  No evidence of any soft tissue problems. The instruments were removed.  A medium Hemovac was placed through the anteromedial portal and this was sutured in place.  The lateral wound was closed using 3-0 nylon.  The wounds were covered with Adaptic orthopedic sponges, Kerlix, and Coban.  The patient was then taken to PACU in stable condition.  Plan for admission IV vancomycin and Zosyn and continue IV antibiotics until cultures are finalized.     Nadara Mustard, MD     MVD/MEDQ  D:  07/17/2010  T:  07/18/2010  Job:  [id number]  Electronically Signed by Aldean Baker MD on 07/22/2010 09:40:46 AM

## 2010-07-30 ENCOUNTER — Ambulatory Visit (HOSPITAL_COMMUNITY)
Admission: RE | Admit: 2010-07-30 | Discharge: 2010-07-30 | Disposition: A | Discharge status: Home / Self Care | Payer: BC Managed Care – PPO | Source: Ambulatory Visit | Attending: Gastroenterology | Admitting: Gastroenterology

## 2010-07-30 ENCOUNTER — Other Ambulatory Visit: Payer: Self-pay | Admitting: Gastroenterology

## 2010-07-30 DIAGNOSIS — I10 Essential (primary) hypertension: Secondary | ICD-10-CM | POA: Insufficient documentation

## 2010-07-30 DIAGNOSIS — Z1211 Encounter for screening for malignant neoplasm of colon: Secondary | ICD-10-CM | POA: Insufficient documentation

## 2010-07-30 DIAGNOSIS — D126 Benign neoplasm of colon, unspecified: Secondary | ICD-10-CM | POA: Insufficient documentation

## 2010-07-30 DIAGNOSIS — Z87891 Personal history of nicotine dependence: Secondary | ICD-10-CM | POA: Insufficient documentation

## 2010-08-10 ENCOUNTER — Other Ambulatory Visit: Payer: Self-pay | Admitting: Dermatology

## 2010-08-13 NOTE — Op Note (Signed)
  NAMESISSI, PADIA NO.:  [id number]  MEDICAL RECORD NO.:  [id number]  LOCATION:  WLEN                         FACILITY:  [facility]  PHYSICIAN:  Danise Edge, M.D.   DATE OF BIRTH:  1945/07/05  DATE OF PROCEDURE:  07/30/2010 DATE OF DISCHARGE:                              OPERATIVE REPORT   PROCEDURE:  Colonoscopy.  REFERRING PHYSICIAN:  Robert P. Merla Riches, M.D., [facility].  HISTORY:  Angela [name] is a 65 year old female born 07-03-1945. The patient is scheduled to undergo her first screening colonoscopy with polypectomy to prevent colon cancer.  MEDICATION ALLERGIES:  Doxycycline and NONSTEROIDAL ANTI-INFLAMMATORY DRUGS.  PAST MEDICAL AND SURGICAL HISTORY:  Mitral valve prolapse, hypertension, constipation, predominant irritable bowel syndrome, jaw surgery, knee surgery, hysterectomy, three D and C following miscarriages.  HABITS:  The patient is a former cigarette smoker who quit smoking cigarettes in 1979.  She consumes alcohol in moderation.  ENDOSCOPIST:  Danise Edge, M.D.  PREMEDICATION:  Propofol was administered by Anesthesia.  PROCEDURE:  The patient was placed in the left lateral decubitus position.  Anal inspection and digital rectal exam were normal.  The Pentax pediatric colonoscope was introduced into the rectum and advanced to the cecum.  Due to a very tortuous colon with colonic loop formation, advancement of the colonoscope around the colon was technically difficult.  Colonic preparation for the exam today was good.  Rectum normal.  Retroflex view of the distal rectum normal. Sigmoid colon.  From the distal sigmoid colon, a 5-mm sessile polyp was removed with cold snare.  Descending colon normal.  Splenic flexure normal.  Transverse colon normal.  Hepatic flexure normal.  Ascending colon normal.  Cecum and ileocecal valve normal.  ASSESSMENT: 1. A 5-mm sessile polyp was removed  from the distal sigmoid colon with     the cold snare. 2. Otherwise normal screening proctocolonoscopy to the cecum.  RECOMMENDATIONS:  If sigmoid colon polyp returns neoplastic pathologically, the patient should undergo a surveillance colonoscopy in 5 years.  If the sigmoid colon polyp is nonneoplastic pathologically, the patient should undergo a repeat screening colonoscopy in 10 years.          ______________________________ Danise Edge, M.D.     MJ/MEDQ  D:  07/30/2010  T:  07/30/2010  Job:  [id number]  cc:   Harrel Lemon. Merla Riches, M.D. Fax: [phone]  Electronically Signed by Danise Edge M.D. on 08/13/2010 04:13:37 PM

## 2010-08-17 ENCOUNTER — Other Ambulatory Visit: Payer: Self-pay | Admitting: Dermatology

## 2010-09-24 ENCOUNTER — Other Ambulatory Visit: Payer: Self-pay | Admitting: Dermatology

## 2010-11-03 ENCOUNTER — Other Ambulatory Visit: Payer: Self-pay | Admitting: Orthopedic Surgery

## 2010-11-03 DIAGNOSIS — M25571 Pain in right ankle and joints of right foot: Secondary | ICD-10-CM

## 2010-11-04 ENCOUNTER — Ambulatory Visit
Admission: RE | Admit: 2010-11-04 | Discharge: 2010-11-04 | Disposition: A | Discharge status: Home / Self Care | Payer: BC Managed Care – PPO | Source: Ambulatory Visit | Attending: Orthopedic Surgery | Admitting: Orthopedic Surgery

## 2010-11-04 DIAGNOSIS — M25571 Pain in right ankle and joints of right foot: Secondary | ICD-10-CM

## 2010-11-20 ENCOUNTER — Other Ambulatory Visit: Payer: Self-pay | Admitting: Dermatology

## 2011-03-31 DIAGNOSIS — M5412 Radiculopathy, cervical region: Secondary | ICD-10-CM | POA: Diagnosis not present

## 2011-04-02 DIAGNOSIS — M5030 Other cervical disc degeneration, unspecified cervical region: Secondary | ICD-10-CM | POA: Diagnosis not present

## 2011-04-12 DIAGNOSIS — M5412 Radiculopathy, cervical region: Secondary | ICD-10-CM | POA: Diagnosis not present

## 2011-04-22 DIAGNOSIS — M5030 Other cervical disc degeneration, unspecified cervical region: Secondary | ICD-10-CM | POA: Diagnosis not present

## 2011-04-22 DIAGNOSIS — M5412 Radiculopathy, cervical region: Secondary | ICD-10-CM | POA: Diagnosis not present

## 2011-04-23 ENCOUNTER — Other Ambulatory Visit: Payer: Self-pay | Admitting: Internal Medicine

## 2011-04-24 ENCOUNTER — Other Ambulatory Visit: Payer: Self-pay | Admitting: Internal Medicine

## 2011-04-24 DIAGNOSIS — I10 Essential (primary) hypertension: Secondary | ICD-10-CM | POA: Diagnosis not present

## 2011-04-29 ENCOUNTER — Other Ambulatory Visit: Payer: Self-pay | Admitting: *Deleted

## 2011-04-29 MED ORDER — LISINOPRIL-HYDROCHLOROTHIAZIDE 10-12.5 MG PO TABS: 1.0000 | ORAL_TABLET | Freq: Every day | ORAL | Status: DC

## 2011-04-30 DIAGNOSIS — M5412 Radiculopathy, cervical region: Secondary | ICD-10-CM | POA: Diagnosis not present

## 2011-06-11 ENCOUNTER — Other Ambulatory Visit: Payer: Self-pay | Admitting: Dermatology

## 2011-06-11 DIAGNOSIS — L578 Other skin changes due to chronic exposure to nonionizing radiation: Secondary | ICD-10-CM | POA: Diagnosis not present

## 2011-06-11 DIAGNOSIS — L723 Sebaceous cyst: Secondary | ICD-10-CM | POA: Diagnosis not present

## 2011-06-11 DIAGNOSIS — L570 Actinic keratosis: Secondary | ICD-10-CM | POA: Diagnosis not present

## 2011-06-22 DIAGNOSIS — Z79899 Other long term (current) drug therapy: Secondary | ICD-10-CM | POA: Diagnosis not present

## 2011-06-22 DIAGNOSIS — M5412 Radiculopathy, cervical region: Secondary | ICD-10-CM | POA: Diagnosis not present

## 2011-08-04 DIAGNOSIS — R609 Edema, unspecified: Secondary | ICD-10-CM | POA: Diagnosis not present

## 2011-08-04 DIAGNOSIS — E538 Deficiency of other specified B group vitamins: Secondary | ICD-10-CM | POA: Diagnosis not present

## 2011-08-04 DIAGNOSIS — R6884 Jaw pain: Secondary | ICD-10-CM | POA: Diagnosis not present

## 2011-08-04 DIAGNOSIS — I10 Essential (primary) hypertension: Secondary | ICD-10-CM | POA: Diagnosis not present

## 2011-08-10 DIAGNOSIS — B009 Herpesviral infection, unspecified: Secondary | ICD-10-CM | POA: Diagnosis not present

## 2011-08-10 DIAGNOSIS — S0180XA Unspecified open wound of other part of head, initial encounter: Secondary | ICD-10-CM | POA: Diagnosis not present

## 2011-08-10 DIAGNOSIS — W540XXA Bitten by dog, initial encounter: Secondary | ICD-10-CM | POA: Diagnosis not present

## 2011-08-12 DIAGNOSIS — R51 Headache: Secondary | ICD-10-CM | POA: Diagnosis not present

## 2011-08-12 DIAGNOSIS — M542 Cervicalgia: Secondary | ICD-10-CM | POA: Diagnosis not present

## 2011-08-12 DIAGNOSIS — G894 Chronic pain syndrome: Secondary | ICD-10-CM | POA: Diagnosis not present

## 2011-08-12 DIAGNOSIS — Z79899 Other long term (current) drug therapy: Secondary | ICD-10-CM | POA: Diagnosis not present

## 2011-08-13 DIAGNOSIS — I359 Nonrheumatic aortic valve disorder, unspecified: Secondary | ICD-10-CM | POA: Diagnosis not present

## 2011-08-13 DIAGNOSIS — R609 Edema, unspecified: Secondary | ICD-10-CM | POA: Diagnosis not present

## 2011-08-16 ENCOUNTER — Other Ambulatory Visit: Payer: Self-pay | Admitting: Pain Medicine

## 2011-08-16 DIAGNOSIS — R6884 Jaw pain: Secondary | ICD-10-CM

## 2011-08-16 DIAGNOSIS — M542 Cervicalgia: Secondary | ICD-10-CM

## 2011-08-23 ENCOUNTER — Ambulatory Visit
Admission: RE | Admit: 2011-08-23 | Discharge: 2011-08-23 | Disposition: A | Discharge status: Home / Self Care | Payer: Medicare Other | Source: Ambulatory Visit | Attending: Pain Medicine | Admitting: Pain Medicine

## 2011-08-23 DIAGNOSIS — R6884 Jaw pain: Secondary | ICD-10-CM

## 2011-08-23 DIAGNOSIS — M25519 Pain in unspecified shoulder: Secondary | ICD-10-CM | POA: Diagnosis not present

## 2011-08-23 DIAGNOSIS — M5020 Other cervical disc displacement, unspecified cervical region: Secondary | ICD-10-CM | POA: Diagnosis not present

## 2011-08-23 DIAGNOSIS — M8569 Other cyst of bone, multiple sites: Secondary | ICD-10-CM | POA: Diagnosis not present

## 2011-08-23 DIAGNOSIS — M542 Cervicalgia: Secondary | ICD-10-CM

## 2011-08-23 DIAGNOSIS — M5030 Other cervical disc degeneration, unspecified cervical region: Secondary | ICD-10-CM | POA: Diagnosis not present

## 2011-08-23 DIAGNOSIS — M26639 Articular disc disorder of temporomandibular joint, unspecified side: Secondary | ICD-10-CM | POA: Diagnosis not present

## 2011-08-23 MED ORDER — GADOBENATE DIMEGLUMINE 529 MG/ML IV SOLN
10.0000 mL | Freq: Once | INTRAVENOUS | Status: AC | PRN
  Administered 2011-08-23: 10 mL via INTRAVENOUS

## 2011-08-27 DIAGNOSIS — E538 Deficiency of other specified B group vitamins: Secondary | ICD-10-CM | POA: Diagnosis not present

## 2011-08-27 DIAGNOSIS — I10 Essential (primary) hypertension: Secondary | ICD-10-CM | POA: Diagnosis not present

## 2011-09-06 DIAGNOSIS — R609 Edema, unspecified: Secondary | ICD-10-CM | POA: Diagnosis not present

## 2011-09-06 DIAGNOSIS — R6884 Jaw pain: Secondary | ICD-10-CM | POA: Diagnosis not present

## 2011-09-06 DIAGNOSIS — I10 Essential (primary) hypertension: Secondary | ICD-10-CM | POA: Diagnosis not present

## 2011-09-06 DIAGNOSIS — E538 Deficiency of other specified B group vitamins: Secondary | ICD-10-CM | POA: Diagnosis not present

## 2011-09-09 DIAGNOSIS — R51 Headache: Secondary | ICD-10-CM | POA: Diagnosis not present

## 2011-09-09 DIAGNOSIS — Z79899 Other long term (current) drug therapy: Secondary | ICD-10-CM | POA: Diagnosis not present

## 2011-09-09 DIAGNOSIS — M5030 Other cervical disc degeneration, unspecified cervical region: Secondary | ICD-10-CM | POA: Diagnosis not present

## 2011-09-09 DIAGNOSIS — G894 Chronic pain syndrome: Secondary | ICD-10-CM | POA: Diagnosis not present

## 2011-09-21 DIAGNOSIS — R112 Nausea with vomiting, unspecified: Secondary | ICD-10-CM | POA: Diagnosis not present

## 2011-09-21 DIAGNOSIS — R609 Edema, unspecified: Secondary | ICD-10-CM | POA: Diagnosis not present

## 2011-09-21 DIAGNOSIS — R5383 Other fatigue: Secondary | ICD-10-CM | POA: Diagnosis not present

## 2011-09-29 DIAGNOSIS — K122 Cellulitis and abscess of mouth: Secondary | ICD-10-CM | POA: Diagnosis not present

## 2011-09-29 DIAGNOSIS — R6884 Jaw pain: Secondary | ICD-10-CM | POA: Diagnosis not present

## 2011-10-02 DIAGNOSIS — R404 Transient alteration of awareness: Secondary | ICD-10-CM | POA: Diagnosis not present

## 2011-10-02 DIAGNOSIS — G4710 Hypersomnia, unspecified: Secondary | ICD-10-CM | POA: Diagnosis not present

## 2011-10-02 DIAGNOSIS — G4730 Sleep apnea, unspecified: Secondary | ICD-10-CM | POA: Diagnosis not present

## 2011-10-07 DIAGNOSIS — M5030 Other cervical disc degeneration, unspecified cervical region: Secondary | ICD-10-CM | POA: Diagnosis not present

## 2011-10-07 DIAGNOSIS — R51 Headache: Secondary | ICD-10-CM | POA: Diagnosis not present

## 2011-10-07 DIAGNOSIS — G894 Chronic pain syndrome: Secondary | ICD-10-CM | POA: Diagnosis not present

## 2011-10-08 DIAGNOSIS — G4730 Sleep apnea, unspecified: Secondary | ICD-10-CM | POA: Diagnosis not present

## 2011-10-08 DIAGNOSIS — R404 Transient alteration of awareness: Secondary | ICD-10-CM | POA: Diagnosis not present

## 2011-10-18 DIAGNOSIS — K219 Gastro-esophageal reflux disease without esophagitis: Secondary | ICD-10-CM | POA: Diagnosis not present

## 2011-10-18 DIAGNOSIS — J387 Other diseases of larynx: Secondary | ICD-10-CM | POA: Diagnosis not present

## 2011-10-18 DIAGNOSIS — M26609 Unspecified temporomandibular joint disorder, unspecified side: Secondary | ICD-10-CM | POA: Diagnosis not present

## 2011-10-18 DIAGNOSIS — Z1231 Encounter for screening mammogram for malignant neoplasm of breast: Secondary | ICD-10-CM | POA: Diagnosis not present

## 2011-10-18 DIAGNOSIS — M2669 Other specified disorders of temporomandibular joint: Secondary | ICD-10-CM | POA: Diagnosis not present

## 2011-10-18 DIAGNOSIS — Z803 Family history of malignant neoplasm of breast: Secondary | ICD-10-CM | POA: Diagnosis not present

## 2011-10-18 DIAGNOSIS — G4733 Obstructive sleep apnea (adult) (pediatric): Secondary | ICD-10-CM | POA: Diagnosis not present

## 2011-10-19 ENCOUNTER — Other Ambulatory Visit: Payer: Self-pay | Admitting: Otolaryngology

## 2011-10-19 DIAGNOSIS — K219 Gastro-esophageal reflux disease without esophagitis: Secondary | ICD-10-CM

## 2011-10-21 ENCOUNTER — Ambulatory Visit
Admission: RE | Admit: 2011-10-21 | Discharge: 2011-10-21 | Disposition: A | Discharge status: Home / Self Care | Payer: Medicare Other | Source: Ambulatory Visit | Attending: Otolaryngology | Admitting: Otolaryngology

## 2011-10-21 DIAGNOSIS — K219 Gastro-esophageal reflux disease without esophagitis: Secondary | ICD-10-CM | POA: Diagnosis not present

## 2011-10-25 DIAGNOSIS — G4730 Sleep apnea, unspecified: Secondary | ICD-10-CM | POA: Diagnosis not present

## 2011-10-25 DIAGNOSIS — G4710 Hypersomnia, unspecified: Secondary | ICD-10-CM | POA: Diagnosis not present

## 2011-10-27 DIAGNOSIS — Z79899 Other long term (current) drug therapy: Secondary | ICD-10-CM | POA: Diagnosis not present

## 2011-10-27 DIAGNOSIS — M542 Cervicalgia: Secondary | ICD-10-CM | POA: Diagnosis not present

## 2011-10-27 DIAGNOSIS — G501 Atypical facial pain: Secondary | ICD-10-CM | POA: Diagnosis not present

## 2011-10-27 DIAGNOSIS — G894 Chronic pain syndrome: Secondary | ICD-10-CM | POA: Diagnosis not present

## 2011-11-03 ENCOUNTER — Other Ambulatory Visit: Payer: Self-pay | Admitting: Gastroenterology

## 2011-11-03 DIAGNOSIS — R112 Nausea with vomiting, unspecified: Secondary | ICD-10-CM

## 2011-11-03 DIAGNOSIS — Z86010 Personal history of colonic polyps: Secondary | ICD-10-CM | POA: Diagnosis not present

## 2011-11-04 DIAGNOSIS — G501 Atypical facial pain: Secondary | ICD-10-CM | POA: Diagnosis not present

## 2011-11-04 DIAGNOSIS — G894 Chronic pain syndrome: Secondary | ICD-10-CM | POA: Diagnosis not present

## 2011-11-04 DIAGNOSIS — R51 Headache: Secondary | ICD-10-CM | POA: Diagnosis not present

## 2011-11-04 DIAGNOSIS — M542 Cervicalgia: Secondary | ICD-10-CM | POA: Diagnosis not present

## 2011-11-11 ENCOUNTER — Ambulatory Visit
Admission: RE | Admit: 2011-11-11 | Discharge: 2011-11-11 | Disposition: A | Discharge status: Home / Self Care | Payer: Medicare Other | Source: Ambulatory Visit | Attending: Gastroenterology | Admitting: Gastroenterology

## 2011-11-11 DIAGNOSIS — R112 Nausea with vomiting, unspecified: Secondary | ICD-10-CM

## 2011-11-16 ENCOUNTER — Other Ambulatory Visit: Payer: Self-pay | Admitting: Gastroenterology

## 2011-11-16 DIAGNOSIS — R110 Nausea: Secondary | ICD-10-CM

## 2011-11-17 ENCOUNTER — Ambulatory Visit
Admission: RE | Admit: 2011-11-17 | Discharge: 2011-11-17 | Disposition: A | Discharge status: Home / Self Care | Payer: Medicare Other | Source: Ambulatory Visit | Attending: Gastroenterology | Admitting: Gastroenterology

## 2011-11-17 DIAGNOSIS — R1084 Generalized abdominal pain: Secondary | ICD-10-CM | POA: Diagnosis not present

## 2011-11-17 DIAGNOSIS — R110 Nausea: Secondary | ICD-10-CM | POA: Diagnosis not present

## 2011-11-17 MED ORDER — IOHEXOL 300 MG/ML  SOLN
100.0000 mL | Freq: Once | INTRAMUSCULAR | Status: AC | PRN
  Administered 2011-11-17: 100 mL via INTRAVENOUS

## 2011-11-18 DIAGNOSIS — M2669 Other specified disorders of temporomandibular joint: Secondary | ICD-10-CM | POA: Diagnosis not present

## 2011-11-18 DIAGNOSIS — G4733 Obstructive sleep apnea (adult) (pediatric): Secondary | ICD-10-CM | POA: Diagnosis not present

## 2011-11-18 DIAGNOSIS — K219 Gastro-esophageal reflux disease without esophagitis: Secondary | ICD-10-CM | POA: Diagnosis not present

## 2011-11-18 DIAGNOSIS — J387 Other diseases of larynx: Secondary | ICD-10-CM | POA: Diagnosis not present

## 2011-11-19 ENCOUNTER — Other Ambulatory Visit (HOSPITAL_COMMUNITY): Payer: Self-pay | Admitting: Gastroenterology

## 2011-11-19 DIAGNOSIS — R1110 Vomiting, unspecified: Secondary | ICD-10-CM

## 2011-11-19 DIAGNOSIS — R110 Nausea: Secondary | ICD-10-CM | POA: Diagnosis not present

## 2011-11-25 DIAGNOSIS — G501 Atypical facial pain: Secondary | ICD-10-CM | POA: Diagnosis not present

## 2011-11-25 DIAGNOSIS — G894 Chronic pain syndrome: Secondary | ICD-10-CM | POA: Diagnosis not present

## 2011-11-25 DIAGNOSIS — R51 Headache: Secondary | ICD-10-CM | POA: Diagnosis not present

## 2011-11-25 DIAGNOSIS — Z79899 Other long term (current) drug therapy: Secondary | ICD-10-CM | POA: Diagnosis not present

## 2011-11-26 ENCOUNTER — Other Ambulatory Visit: Payer: Self-pay | Admitting: Internal Medicine

## 2011-11-26 DIAGNOSIS — R9389 Abnormal findings on diagnostic imaging of other specified body structures: Secondary | ICD-10-CM

## 2011-12-02 ENCOUNTER — Ambulatory Visit
Admission: RE | Admit: 2011-12-02 | Discharge: 2011-12-02 | Disposition: A | Discharge status: Home / Self Care | Payer: Medicare Other | Source: Ambulatory Visit | Attending: Internal Medicine | Admitting: Internal Medicine

## 2011-12-02 ENCOUNTER — Ambulatory Visit (HOSPITAL_COMMUNITY)
Admission: RE | Admit: 2011-12-02 | Discharge: 2011-12-02 | Disposition: A | Discharge status: Home / Self Care | Payer: Medicare Other | Source: Ambulatory Visit | Attending: Gastroenterology | Admitting: Gastroenterology

## 2011-12-02 DIAGNOSIS — M5137 Other intervertebral disc degeneration, lumbosacral region: Secondary | ICD-10-CM | POA: Diagnosis not present

## 2011-12-02 DIAGNOSIS — R1110 Vomiting, unspecified: Secondary | ICD-10-CM

## 2011-12-02 DIAGNOSIS — R109 Unspecified abdominal pain: Secondary | ICD-10-CM | POA: Insufficient documentation

## 2011-12-02 DIAGNOSIS — R112 Nausea with vomiting, unspecified: Secondary | ICD-10-CM | POA: Diagnosis not present

## 2011-12-02 DIAGNOSIS — R110 Nausea: Secondary | ICD-10-CM

## 2011-12-02 DIAGNOSIS — R9389 Abnormal findings on diagnostic imaging of other specified body structures: Secondary | ICD-10-CM

## 2011-12-02 MED ORDER — GADOBENATE DIMEGLUMINE 529 MG/ML IV SOLN
11.0000 mL | Freq: Once | INTRAVENOUS | Status: AC | PRN
  Administered 2011-12-02: 11 mL via INTRAVENOUS

## 2011-12-02 MED ORDER — TECHNETIUM TC 99M SULFUR COLLOID
2.1000 | Freq: Once | INTRAVENOUS | Status: AC | PRN
  Administered 2011-12-02: 2.1 via INTRAVENOUS

## 2011-12-16 DIAGNOSIS — Z23 Encounter for immunization: Secondary | ICD-10-CM | POA: Diagnosis not present

## 2011-12-16 DIAGNOSIS — K5900 Constipation, unspecified: Secondary | ICD-10-CM | POA: Diagnosis not present

## 2011-12-16 DIAGNOSIS — R6884 Jaw pain: Secondary | ICD-10-CM | POA: Diagnosis not present

## 2011-12-22 DIAGNOSIS — G501 Atypical facial pain: Secondary | ICD-10-CM | POA: Diagnosis not present

## 2011-12-22 DIAGNOSIS — M542 Cervicalgia: Secondary | ICD-10-CM | POA: Diagnosis not present

## 2011-12-22 DIAGNOSIS — Z79899 Other long term (current) drug therapy: Secondary | ICD-10-CM | POA: Diagnosis not present

## 2011-12-22 DIAGNOSIS — G894 Chronic pain syndrome: Secondary | ICD-10-CM | POA: Diagnosis not present

## 2012-01-19 DIAGNOSIS — G894 Chronic pain syndrome: Secondary | ICD-10-CM | POA: Diagnosis not present

## 2012-01-19 DIAGNOSIS — R51 Headache: Secondary | ICD-10-CM | POA: Diagnosis not present

## 2012-01-19 DIAGNOSIS — Z79899 Other long term (current) drug therapy: Secondary | ICD-10-CM | POA: Diagnosis not present

## 2012-01-19 DIAGNOSIS — G501 Atypical facial pain: Secondary | ICD-10-CM | POA: Diagnosis not present

## 2012-02-11 DIAGNOSIS — L659 Nonscarring hair loss, unspecified: Secondary | ICD-10-CM | POA: Diagnosis not present

## 2012-02-11 DIAGNOSIS — I10 Essential (primary) hypertension: Secondary | ICD-10-CM | POA: Diagnosis not present

## 2012-02-11 DIAGNOSIS — E538 Deficiency of other specified B group vitamins: Secondary | ICD-10-CM | POA: Diagnosis not present

## 2012-02-16 DIAGNOSIS — Z79899 Other long term (current) drug therapy: Secondary | ICD-10-CM | POA: Diagnosis not present

## 2012-02-16 DIAGNOSIS — R51 Headache: Secondary | ICD-10-CM | POA: Diagnosis not present

## 2012-02-16 DIAGNOSIS — M542 Cervicalgia: Secondary | ICD-10-CM | POA: Diagnosis not present

## 2012-02-16 DIAGNOSIS — G894 Chronic pain syndrome: Secondary | ICD-10-CM | POA: Diagnosis not present

## 2012-02-21 DIAGNOSIS — L821 Other seborrheic keratosis: Secondary | ICD-10-CM | POA: Diagnosis not present

## 2012-02-21 DIAGNOSIS — L578 Other skin changes due to chronic exposure to nonionizing radiation: Secondary | ICD-10-CM | POA: Diagnosis not present

## 2012-02-21 DIAGNOSIS — L819 Disorder of pigmentation, unspecified: Secondary | ICD-10-CM | POA: Diagnosis not present

## 2012-02-21 DIAGNOSIS — L820 Inflamed seborrheic keratosis: Secondary | ICD-10-CM | POA: Diagnosis not present

## 2012-02-21 DIAGNOSIS — L570 Actinic keratosis: Secondary | ICD-10-CM | POA: Diagnosis not present

## 2012-02-28 DIAGNOSIS — M899 Disorder of bone, unspecified: Secondary | ICD-10-CM | POA: Diagnosis not present

## 2012-02-28 DIAGNOSIS — I10 Essential (primary) hypertension: Secondary | ICD-10-CM | POA: Diagnosis not present

## 2012-02-28 DIAGNOSIS — R6884 Jaw pain: Secondary | ICD-10-CM | POA: Diagnosis not present

## 2012-02-28 DIAGNOSIS — R609 Edema, unspecified: Secondary | ICD-10-CM | POA: Diagnosis not present

## 2012-03-15 DIAGNOSIS — Z79899 Other long term (current) drug therapy: Secondary | ICD-10-CM | POA: Diagnosis not present

## 2012-03-15 DIAGNOSIS — G894 Chronic pain syndrome: Secondary | ICD-10-CM | POA: Diagnosis not present

## 2012-03-15 DIAGNOSIS — G501 Atypical facial pain: Secondary | ICD-10-CM | POA: Diagnosis not present

## 2012-04-07 DIAGNOSIS — M542 Cervicalgia: Secondary | ICD-10-CM | POA: Diagnosis not present

## 2012-04-07 DIAGNOSIS — Z79899 Other long term (current) drug therapy: Secondary | ICD-10-CM | POA: Diagnosis not present

## 2012-04-07 DIAGNOSIS — G894 Chronic pain syndrome: Secondary | ICD-10-CM | POA: Diagnosis not present

## 2012-04-07 DIAGNOSIS — R51 Headache: Secondary | ICD-10-CM | POA: Diagnosis not present

## 2012-04-19 ENCOUNTER — Other Ambulatory Visit: Payer: Self-pay

## 2012-04-19 DIAGNOSIS — Z85828 Personal history of other malignant neoplasm of skin: Secondary | ICD-10-CM | POA: Diagnosis not present

## 2012-04-19 DIAGNOSIS — D485 Neoplasm of uncertain behavior of skin: Secondary | ICD-10-CM | POA: Diagnosis not present

## 2012-04-19 DIAGNOSIS — L570 Actinic keratosis: Secondary | ICD-10-CM | POA: Diagnosis not present

## 2012-04-21 DIAGNOSIS — M2669 Other specified disorders of temporomandibular joint: Secondary | ICD-10-CM | POA: Diagnosis not present

## 2012-04-21 DIAGNOSIS — M26639 Articular disc disorder of temporomandibular joint, unspecified side: Secondary | ICD-10-CM | POA: Diagnosis not present

## 2012-05-05 ENCOUNTER — Other Ambulatory Visit: Payer: Self-pay | Admitting: *Deleted

## 2012-05-05 DIAGNOSIS — Z79899 Other long term (current) drug therapy: Secondary | ICD-10-CM | POA: Diagnosis not present

## 2012-05-05 DIAGNOSIS — R6884 Jaw pain: Secondary | ICD-10-CM

## 2012-05-05 DIAGNOSIS — G894 Chronic pain syndrome: Secondary | ICD-10-CM | POA: Diagnosis not present

## 2012-05-05 DIAGNOSIS — G501 Atypical facial pain: Secondary | ICD-10-CM | POA: Diagnosis not present

## 2012-05-11 ENCOUNTER — Ambulatory Visit
Admission: RE | Admit: 2012-05-11 | Discharge: 2012-05-11 | Disposition: A | Discharge status: Home / Self Care | Payer: Medicare Other | Source: Ambulatory Visit | Attending: *Deleted | Admitting: *Deleted

## 2012-05-11 DIAGNOSIS — R6884 Jaw pain: Secondary | ICD-10-CM | POA: Diagnosis not present

## 2012-05-12 ENCOUNTER — Other Ambulatory Visit: Payer: Medicare Other

## 2012-05-12 DIAGNOSIS — K219 Gastro-esophageal reflux disease without esophagitis: Secondary | ICD-10-CM | POA: Diagnosis not present

## 2012-05-12 DIAGNOSIS — J385 Laryngeal spasm: Secondary | ICD-10-CM | POA: Diagnosis not present

## 2012-05-15 ENCOUNTER — Other Ambulatory Visit: Payer: Self-pay | Admitting: Otolaryngology

## 2012-05-15 DIAGNOSIS — J385 Laryngeal spasm: Secondary | ICD-10-CM

## 2012-05-15 DIAGNOSIS — K219 Gastro-esophageal reflux disease without esophagitis: Secondary | ICD-10-CM

## 2012-05-15 DIAGNOSIS — J387 Other diseases of larynx: Secondary | ICD-10-CM

## 2012-05-24 ENCOUNTER — Other Ambulatory Visit: Payer: Medicare Other

## 2012-06-08 DIAGNOSIS — G501 Atypical facial pain: Secondary | ICD-10-CM | POA: Diagnosis not present

## 2012-06-08 DIAGNOSIS — M5030 Other cervical disc degeneration, unspecified cervical region: Secondary | ICD-10-CM | POA: Diagnosis not present

## 2012-06-08 DIAGNOSIS — G894 Chronic pain syndrome: Secondary | ICD-10-CM | POA: Diagnosis not present

## 2012-06-08 DIAGNOSIS — Z79899 Other long term (current) drug therapy: Secondary | ICD-10-CM | POA: Diagnosis not present

## 2012-06-14 DIAGNOSIS — L578 Other skin changes due to chronic exposure to nonionizing radiation: Secondary | ICD-10-CM | POA: Diagnosis not present

## 2012-06-14 DIAGNOSIS — L821 Other seborrheic keratosis: Secondary | ICD-10-CM | POA: Diagnosis not present

## 2012-06-14 DIAGNOSIS — L819 Disorder of pigmentation, unspecified: Secondary | ICD-10-CM | POA: Diagnosis not present

## 2012-06-14 DIAGNOSIS — Z85828 Personal history of other malignant neoplasm of skin: Secondary | ICD-10-CM | POA: Diagnosis not present

## 2012-06-20 DIAGNOSIS — B009 Herpesviral infection, unspecified: Secondary | ICD-10-CM | POA: Diagnosis not present

## 2012-06-20 DIAGNOSIS — B373 Candidiasis of vulva and vagina: Secondary | ICD-10-CM | POA: Diagnosis not present

## 2012-06-20 DIAGNOSIS — L659 Nonscarring hair loss, unspecified: Secondary | ICD-10-CM | POA: Diagnosis not present

## 2012-06-30 DIAGNOSIS — M5030 Other cervical disc degeneration, unspecified cervical region: Secondary | ICD-10-CM | POA: Diagnosis not present

## 2012-06-30 DIAGNOSIS — G894 Chronic pain syndrome: Secondary | ICD-10-CM | POA: Diagnosis not present

## 2012-06-30 DIAGNOSIS — M531 Cervicobrachial syndrome: Secondary | ICD-10-CM | POA: Diagnosis not present

## 2012-06-30 DIAGNOSIS — Z79899 Other long term (current) drug therapy: Secondary | ICD-10-CM | POA: Diagnosis not present

## 2012-07-31 DIAGNOSIS — Z79899 Other long term (current) drug therapy: Secondary | ICD-10-CM | POA: Diagnosis not present

## 2012-07-31 DIAGNOSIS — G894 Chronic pain syndrome: Secondary | ICD-10-CM | POA: Diagnosis not present

## 2012-07-31 DIAGNOSIS — G501 Atypical facial pain: Secondary | ICD-10-CM | POA: Diagnosis not present

## 2012-08-01 ENCOUNTER — Encounter (HOSPITAL_COMMUNITY): Payer: Self-pay | Admitting: Physical Medicine and Rehabilitation

## 2012-08-01 ENCOUNTER — Emergency Department (HOSPITAL_COMMUNITY): Payer: Medicare Other

## 2012-08-01 ENCOUNTER — Emergency Department (HOSPITAL_COMMUNITY)
Admission: EM | Admit: 2012-08-01 | Discharge: 2012-08-01 | Disposition: A | Discharge status: Home / Self Care | Payer: Medicare Other | Attending: Emergency Medicine | Admitting: Emergency Medicine

## 2012-08-01 DIAGNOSIS — S59909A Unspecified injury of unspecified elbow, initial encounter: Secondary | ICD-10-CM | POA: Diagnosis not present

## 2012-08-01 DIAGNOSIS — M79609 Pain in unspecified limb: Secondary | ICD-10-CM | POA: Diagnosis not present

## 2012-08-01 DIAGNOSIS — S6990XA Unspecified injury of unspecified wrist, hand and finger(s), initial encounter: Secondary | ICD-10-CM | POA: Diagnosis not present

## 2012-08-01 DIAGNOSIS — S6000XA Contusion of unspecified finger without damage to nail, initial encounter: Secondary | ICD-10-CM | POA: Diagnosis not present

## 2012-08-01 DIAGNOSIS — Y9301 Activity, walking, marching and hiking: Secondary | ICD-10-CM | POA: Insufficient documentation

## 2012-08-01 DIAGNOSIS — S62113A Displaced fracture of triquetrum [cuneiform] bone, unspecified wrist, initial encounter for closed fracture: Secondary | ICD-10-CM | POA: Insufficient documentation

## 2012-08-01 DIAGNOSIS — W010XXA Fall on same level from slipping, tripping and stumbling without subsequent striking against object, initial encounter: Secondary | ICD-10-CM | POA: Insufficient documentation

## 2012-08-01 DIAGNOSIS — M7989 Other specified soft tissue disorders: Secondary | ICD-10-CM | POA: Diagnosis not present

## 2012-08-01 DIAGNOSIS — S60051A Contusion of right little finger without damage to nail, initial encounter: Secondary | ICD-10-CM

## 2012-08-01 DIAGNOSIS — Y929 Unspecified place or not applicable: Secondary | ICD-10-CM | POA: Insufficient documentation

## 2012-08-01 DIAGNOSIS — S62112A Displaced fracture of triquetrum [cuneiform] bone, left wrist, initial encounter for closed fracture: Secondary | ICD-10-CM

## 2012-08-01 MED ORDER — OXYCODONE-ACETAMINOPHEN 5-325 MG PO TABS
1.0000 | ORAL_TABLET | Freq: Once | ORAL | Status: AC
  Administered 2012-08-01: 1 via ORAL
  Filled 2012-08-01: qty 1

## 2012-08-01 NOTE — ED Notes (Signed)
Patient transported to X-ray 

## 2012-08-01 NOTE — ED Provider Notes (Signed)
History    CSN: [id number] Arrival date & time 08/01/12  4098  First MD Initiated Contact with Patient 08/01/12 [phone]     Chief Complaint  Patient presents with  . Wrist Pain   (Consider location/radiation/quality/duration/timing/severity/associated sxs/prior Treatment) Patient is a 67 y.o. female presenting with wrist pain. The history is provided by the patient.  Wrist Pain  She was hiking yesterday and tripped over her dog and fell on outstretched hands. She is complaining of pain in the right fifth finger and in the left wrist. Pain is severe and she rates it at 9/10. She was unable to sleep during the night because of the pain. She denies other injury. She is a chronic pain patient and has taken some of her Nucynta with little relief. No past medical history on file. No past surgical history on file. No family history on file. History  Substance Use Topics  . Smoking status: Not on file  . Smokeless tobacco: Not on file  . Alcohol Use: Not on file   OB History   No data available     Review of Systems  All other systems reviewed and are negative.    Allergies  Doxycycline and Nsaids  Home Medications   Current Outpatient Rx  Name  Route  Sig  Dispense  Refill  . lisinopril-hydrochlorothiazide (PRINZIDE,ZESTORETIC) 10-12.5 MG per tablet   Oral   Take 1 tablet by mouth daily.   30 tablet   0     Pt needs office visit before running out    BP 107/68  Temp(Src) 98.5 F (36.9 C) (Oral)  SpO2 95% Physical Exam  Nursing note and vitals reviewed.  67 year old female, resting comfortably and in no acute distress. Vital signs are normal. Oxygen saturation is 95%, which is normal. Head is normocephalic and atraumatic. PERRLA, EOMI. Oropharynx is clear. Neck is nontender and supple without adenopathy or JVD. Back is nontender and there is no CVA tenderness. Lungs are clear without rales, wheezes, or rhonchi. Chest is nontender. Heart has regular rate and  rhythm without murmur. Abdomen is soft, flat, nontender without masses or hepatosplenomegaly and peristalsis is normoactive. Extremities: There is mild swelling of the left wrist with tenderness to palpation over the radial aspect of the wrist. There is no tenderness over the anatomic snuffbox and there is no pain with axial loading of the from. There is no obvious swelling of the right hand but there is tenderness to palpation over the entire right fifth finger and over the right fourth and fifth metacarpals. Distal neurovascular exam is intact with normal sensation and prompt capillary refill. No obvious deformity is present. Skin is warm and dry without rash. Neurologic: Mental status is normal, cranial nerves are intact, there are no motor or sensory deficits.  ED Course  SPLINT APPLICATION Date/Time: 08/01/2012 7:28 AM Performed by: Dione Booze Authorized by: Preston Fleeting, Itzelle Gains Consent: Verbal consent obtained. written consent not obtained. Risks and benefits: risks, benefits and alternatives were discussed Consent given by: patient Patient understanding: patient states understanding of the procedure being performed Patient consent: the patient's understanding of the procedure matches consent given Procedure consent: procedure consent matches procedure scheduled Relevant documents: relevant documents present and verified Test results: test results available and properly labeled Site marked: the operative site was marked Imaging studies: imaging studies available Required items: required blood products, implants, devices, and special equipment available Patient identity confirmed: verbally with patient and arm band Time out: Immediately prior to procedure a "  time out" was called to verify the correct patient, procedure, equipment, support staff and site/side marked as required. Location details: left wrist Splint type: volar short arm Supplies used: Ortho-Glass, cotton padding and elastic  bandage Post-procedure: The splinted body part was neurovascularly unchanged following the procedure. Patient tolerance: Patient tolerated the procedure well with no immediate complications. Comments: Splint was applied by orthopedic technician. Neurovascular status evaluated by myself following application of splint.   (including critical care time) Dg Wrist Complete Left  08/01/2012   *RADIOLOGY REPORT*  Clinical Data: Larey Seat last night.  Pain and swelling in the left wrist.  LEFT WRIST - COMPLETE 3+ VIEW  Comparison: 09/17/2005  Findings: Diffuse bone demineralization.  Mild degenerative changes in the STT joints.  Focal bone fragment in the dorsum of the left wrist probably arising from the triquetrum.  Overlying dorsal soft tissue swelling suggests a triquetral fracture.  No significant displacement.  No focal bone lesion or bone destruction.  IMPRESSION: Dorsal soft tissue swelling with suggestion of bone fragment over the dorsal aspect of the wrist likely representing a triquetral fracture.   Original Report Authenticated By: Burman Nieves, M.D.   Dg Hand Complete Right  08/01/2012   *RADIOLOGY REPORT*  Clinical Data: Larey Seat last night with pain in the fourth and fifth fingers of the right hand.  RIGHT HAND - COMPLETE 3+ VIEW  Comparison: None.  Findings: Diffuse bone demineralization.  Left hand and left fourth/fifth fingers appear unremarkable.  No acute fractures are suggested.  No subluxation.  No focal bone lesion or bone destruction.  No radiopaque soft tissue foreign bodies.  IMPRESSION: No acute bony abnormalities demonstrated in the right hand.   Original Report Authenticated By: Burman Nieves, M.D.   Images viewed by me.  1. Fall from slip, trip, or stumble, initial encounter   2. Closed displaced fracture of triquetral bone of left wrist, initial encounter   3. Contusion of little finger without damage to nail, right, initial encounter     MDM  Fall with injury to right hand and  left wrist. X-rays have been ordered.  X-rays are significant for probable fracture of the collection. She's placed in a volar splint and referred to hand surgery for followup. She has pain medications at home and is under a pain management contract, so no narcotic prescriptions are given.  Dione Booze, MD 08/01/12 [phone]

## 2012-08-01 NOTE — ED Notes (Signed)
Pt resting quietly at the time. States some relief with pain medication. EDP at bedside. Ortho paged to place L wrist splint. Pt is alert and oriented x4. Family at bedside.

## 2012-08-01 NOTE — ED Notes (Signed)
Approx 1630 07/31/12 Pt was hiking and FOOSH and face over dog. Pain was bad enough that Pt couldn't sleep.

## 2012-08-01 NOTE — Progress Notes (Signed)
Orthopedic Tech Progress Note Patient Details:  Angela [name] [birth year] [id number]  Ortho Devices Type of Ortho Device: Volar splint Ortho Device/Splint Interventions: Application   Shawnie Pons 08/01/2012, 10:03 AM

## 2012-08-03 DIAGNOSIS — S60229A Contusion of unspecified hand, initial encounter: Secondary | ICD-10-CM | POA: Diagnosis not present

## 2012-08-03 DIAGNOSIS — S60219A Contusion of unspecified wrist, initial encounter: Secondary | ICD-10-CM | POA: Diagnosis not present

## 2012-08-09 DIAGNOSIS — M25549 Pain in joints of unspecified hand: Secondary | ICD-10-CM | POA: Diagnosis not present

## 2012-08-09 DIAGNOSIS — S638X9A Sprain of other part of unspecified wrist and hand, initial encounter: Secondary | ICD-10-CM | POA: Diagnosis not present

## 2012-08-14 DIAGNOSIS — S60229A Contusion of unspecified hand, initial encounter: Secondary | ICD-10-CM | POA: Diagnosis not present

## 2012-08-14 DIAGNOSIS — S60219A Contusion of unspecified wrist, initial encounter: Secondary | ICD-10-CM | POA: Diagnosis not present

## 2012-08-28 DIAGNOSIS — I10 Essential (primary) hypertension: Secondary | ICD-10-CM | POA: Diagnosis not present

## 2012-08-28 DIAGNOSIS — G894 Chronic pain syndrome: Secondary | ICD-10-CM | POA: Diagnosis not present

## 2012-08-28 DIAGNOSIS — M899 Disorder of bone, unspecified: Secondary | ICD-10-CM | POA: Diagnosis not present

## 2012-08-28 DIAGNOSIS — M810 Age-related osteoporosis without current pathological fracture: Secondary | ICD-10-CM | POA: Diagnosis not present

## 2012-08-28 DIAGNOSIS — M5030 Other cervical disc degeneration, unspecified cervical region: Secondary | ICD-10-CM | POA: Diagnosis not present

## 2012-08-28 DIAGNOSIS — M531 Cervicobrachial syndrome: Secondary | ICD-10-CM | POA: Diagnosis not present

## 2012-08-28 DIAGNOSIS — Z0000 Encounter for general adult medical examination without abnormal findings: Secondary | ICD-10-CM | POA: Diagnosis not present

## 2012-08-28 DIAGNOSIS — Z79899 Other long term (current) drug therapy: Secondary | ICD-10-CM | POA: Diagnosis not present

## 2012-08-28 DIAGNOSIS — R609 Edema, unspecified: Secondary | ICD-10-CM | POA: Diagnosis not present

## 2012-09-04 ENCOUNTER — Ambulatory Visit: Payer: Medicare Other | Admitting: Internal Medicine

## 2012-09-04 DIAGNOSIS — G4733 Obstructive sleep apnea (adult) (pediatric): Secondary | ICD-10-CM | POA: Diagnosis not present

## 2012-09-04 DIAGNOSIS — Z0289 Encounter for other administrative examinations: Secondary | ICD-10-CM

## 2012-09-04 DIAGNOSIS — I10 Essential (primary) hypertension: Secondary | ICD-10-CM | POA: Diagnosis not present

## 2012-09-04 DIAGNOSIS — E538 Deficiency of other specified B group vitamins: Secondary | ICD-10-CM | POA: Diagnosis not present

## 2012-09-04 DIAGNOSIS — S60229A Contusion of unspecified hand, initial encounter: Secondary | ICD-10-CM | POA: Diagnosis not present

## 2012-09-04 DIAGNOSIS — S60219A Contusion of unspecified wrist, initial encounter: Secondary | ICD-10-CM | POA: Diagnosis not present

## 2012-09-04 DIAGNOSIS — R609 Edema, unspecified: Secondary | ICD-10-CM | POA: Diagnosis not present

## 2012-09-25 DIAGNOSIS — G894 Chronic pain syndrome: Secondary | ICD-10-CM | POA: Diagnosis not present

## 2012-09-25 DIAGNOSIS — G501 Atypical facial pain: Secondary | ICD-10-CM | POA: Diagnosis not present

## 2012-09-25 DIAGNOSIS — L659 Nonscarring hair loss, unspecified: Secondary | ICD-10-CM | POA: Diagnosis not present

## 2012-09-25 DIAGNOSIS — D649 Anemia, unspecified: Secondary | ICD-10-CM | POA: Diagnosis not present

## 2012-09-25 DIAGNOSIS — M5030 Other cervical disc degeneration, unspecified cervical region: Secondary | ICD-10-CM | POA: Diagnosis not present

## 2012-09-25 DIAGNOSIS — E039 Hypothyroidism, unspecified: Secondary | ICD-10-CM | POA: Diagnosis not present

## 2012-09-25 DIAGNOSIS — Z79899 Other long term (current) drug therapy: Secondary | ICD-10-CM | POA: Diagnosis not present

## 2012-10-02 DIAGNOSIS — S60219A Contusion of unspecified wrist, initial encounter: Secondary | ICD-10-CM | POA: Diagnosis not present

## 2012-10-23 DIAGNOSIS — G501 Atypical facial pain: Secondary | ICD-10-CM | POA: Diagnosis not present

## 2012-10-23 DIAGNOSIS — Z79899 Other long term (current) drug therapy: Secondary | ICD-10-CM | POA: Diagnosis not present

## 2012-10-23 DIAGNOSIS — G894 Chronic pain syndrome: Secondary | ICD-10-CM | POA: Diagnosis not present

## 2012-10-23 DIAGNOSIS — M5030 Other cervical disc degeneration, unspecified cervical region: Secondary | ICD-10-CM | POA: Diagnosis not present

## 2012-11-09 DIAGNOSIS — Z1231 Encounter for screening mammogram for malignant neoplasm of breast: Secondary | ICD-10-CM | POA: Diagnosis not present

## 2012-11-14 DIAGNOSIS — R928 Other abnormal and inconclusive findings on diagnostic imaging of breast: Secondary | ICD-10-CM | POA: Diagnosis not present

## 2012-11-16 DIAGNOSIS — M47812 Spondylosis without myelopathy or radiculopathy, cervical region: Secondary | ICD-10-CM | POA: Diagnosis not present

## 2012-11-20 DIAGNOSIS — Z79899 Other long term (current) drug therapy: Secondary | ICD-10-CM | POA: Diagnosis not present

## 2012-11-20 DIAGNOSIS — G894 Chronic pain syndrome: Secondary | ICD-10-CM | POA: Diagnosis not present

## 2012-11-20 DIAGNOSIS — G501 Atypical facial pain: Secondary | ICD-10-CM | POA: Diagnosis not present

## 2012-11-20 DIAGNOSIS — G8929 Other chronic pain: Secondary | ICD-10-CM | POA: Diagnosis not present

## 2012-11-21 DIAGNOSIS — R300 Dysuria: Secondary | ICD-10-CM | POA: Diagnosis not present

## 2012-11-21 DIAGNOSIS — Z23 Encounter for immunization: Secondary | ICD-10-CM | POA: Diagnosis not present

## 2012-11-21 DIAGNOSIS — R3911 Hesitancy of micturition: Secondary | ICD-10-CM | POA: Diagnosis not present

## 2012-11-27 DIAGNOSIS — D649 Anemia, unspecified: Secondary | ICD-10-CM | POA: Diagnosis not present

## 2012-11-27 DIAGNOSIS — E039 Hypothyroidism, unspecified: Secondary | ICD-10-CM | POA: Diagnosis not present

## 2012-12-15 ENCOUNTER — Other Ambulatory Visit: Payer: Self-pay | Admitting: Dermatology

## 2012-12-15 DIAGNOSIS — C44319 Basal cell carcinoma of skin of other parts of face: Secondary | ICD-10-CM | POA: Diagnosis not present

## 2012-12-15 DIAGNOSIS — L821 Other seborrheic keratosis: Secondary | ICD-10-CM | POA: Diagnosis not present

## 2012-12-15 DIAGNOSIS — C44519 Basal cell carcinoma of skin of other part of trunk: Secondary | ICD-10-CM | POA: Diagnosis not present

## 2012-12-15 DIAGNOSIS — Z85828 Personal history of other malignant neoplasm of skin: Secondary | ICD-10-CM | POA: Diagnosis not present

## 2012-12-15 DIAGNOSIS — L570 Actinic keratosis: Secondary | ICD-10-CM | POA: Diagnosis not present

## 2012-12-18 DIAGNOSIS — M531 Cervicobrachial syndrome: Secondary | ICD-10-CM | POA: Diagnosis not present

## 2012-12-18 DIAGNOSIS — M5030 Other cervical disc degeneration, unspecified cervical region: Secondary | ICD-10-CM | POA: Diagnosis not present

## 2012-12-18 DIAGNOSIS — G894 Chronic pain syndrome: Secondary | ICD-10-CM | POA: Diagnosis not present

## 2013-01-15 DIAGNOSIS — G501 Atypical facial pain: Secondary | ICD-10-CM | POA: Diagnosis not present

## 2013-01-15 DIAGNOSIS — Z79899 Other long term (current) drug therapy: Secondary | ICD-10-CM | POA: Diagnosis not present

## 2013-01-15 DIAGNOSIS — M5030 Other cervical disc degeneration, unspecified cervical region: Secondary | ICD-10-CM | POA: Diagnosis not present

## 2013-01-15 DIAGNOSIS — G894 Chronic pain syndrome: Secondary | ICD-10-CM | POA: Diagnosis not present

## 2013-01-16 ENCOUNTER — Other Ambulatory Visit: Payer: Self-pay | Admitting: Dermatology

## 2013-01-16 DIAGNOSIS — Z85828 Personal history of other malignant neoplasm of skin: Secondary | ICD-10-CM | POA: Diagnosis not present

## 2013-01-16 DIAGNOSIS — C44519 Basal cell carcinoma of skin of other part of trunk: Secondary | ICD-10-CM | POA: Diagnosis not present

## 2013-01-16 DIAGNOSIS — L570 Actinic keratosis: Secondary | ICD-10-CM | POA: Diagnosis not present

## 2013-01-16 DIAGNOSIS — D239 Other benign neoplasm of skin, unspecified: Secondary | ICD-10-CM | POA: Diagnosis not present

## 2013-01-31 DIAGNOSIS — C44111 Basal cell carcinoma of skin of unspecified eyelid, including canthus: Secondary | ICD-10-CM | POA: Diagnosis not present

## 2013-01-31 DIAGNOSIS — C44319 Basal cell carcinoma of skin of other parts of face: Secondary | ICD-10-CM | POA: Diagnosis not present

## 2013-01-31 DIAGNOSIS — Z85828 Personal history of other malignant neoplasm of skin: Secondary | ICD-10-CM | POA: Diagnosis not present

## 2013-02-12 DIAGNOSIS — G894 Chronic pain syndrome: Secondary | ICD-10-CM | POA: Diagnosis not present

## 2013-02-12 DIAGNOSIS — Z79899 Other long term (current) drug therapy: Secondary | ICD-10-CM | POA: Diagnosis not present

## 2013-02-12 DIAGNOSIS — G501 Atypical facial pain: Secondary | ICD-10-CM | POA: Diagnosis not present

## 2013-02-12 DIAGNOSIS — M5030 Other cervical disc degeneration, unspecified cervical region: Secondary | ICD-10-CM | POA: Diagnosis not present

## 2013-03-03 ENCOUNTER — Emergency Department (INDEPENDENT_AMBULATORY_CARE_PROVIDER_SITE_OTHER): Payer: Medicare Other

## 2013-03-03 ENCOUNTER — Encounter (HOSPITAL_COMMUNITY): Payer: Self-pay | Admitting: Emergency Medicine

## 2013-03-03 ENCOUNTER — Emergency Department (INDEPENDENT_AMBULATORY_CARE_PROVIDER_SITE_OTHER)
Admission: EM | Admit: 2013-03-03 | Discharge: 2013-03-03 | Disposition: A | Discharge status: Home / Self Care | Payer: Medicare Other | Source: Home / Self Care | Attending: Emergency Medicine | Admitting: Emergency Medicine

## 2013-03-03 DIAGNOSIS — S62009A Unspecified fracture of navicular [scaphoid] bone of unspecified wrist, initial encounter for closed fracture: Secondary | ICD-10-CM

## 2013-03-03 DIAGNOSIS — M7989 Other specified soft tissue disorders: Secondary | ICD-10-CM | POA: Diagnosis not present

## 2013-03-03 DIAGNOSIS — M259 Joint disorder, unspecified: Secondary | ICD-10-CM | POA: Diagnosis not present

## 2013-03-03 HISTORY — DX: Joint disorder, unspecified: M25.9

## 2013-03-03 HISTORY — DX: Other chronic pain: G89.29

## 2013-03-03 HISTORY — DX: Essential (primary) hypertension: I10

## 2013-03-03 NOTE — ED Provider Notes (Signed)
CSN: [id number]     Arrival date & time 03/03/13  1757 History   First MD Initiated Contact with Patient 03/03/13 2031     Chief Complaint  Patient presents with  . Fall  . Hand Pain     (Consider location/radiation/quality/duration/timing/severity/associated sxs/prior Treatment) HPI Comments: 68 year old female presents complaining of right wrist pain. Earlier today, she slipped on the ice and had a fall on outstretched hand on her right hand. She had immediate pain in the right wrist, and has developed swelling in the wrist. This happened a couple of hours ago. The pain is severe and is worsened with any movement. She has no numbness in the hand and is able to move her fingers, though this does cause pain. She denies any other injury. No previous history of wrist injury or osteopenia/osteoporosis   Past Medical History  Diagnosis Date  . Joint disorder   . Chronic pain   . Hypertension    Past Surgical History  Procedure Laterality Date  . Ankle surgery      x3   No family history on file. History  Substance Use Topics  . Smoking status: Never Smoker   . Smokeless tobacco: Not on file  . Alcohol Use: Yes     Comment: occasional   OB History   Grav Para Term Preterm Abortions TAB SAB Ect Mult Living                 Review of Systems  Constitutional: Negative for fever and chills.  Eyes: Negative for visual disturbance.  Respiratory: Negative for cough and shortness of breath.   Cardiovascular: Negative for chest pain, palpitations and leg swelling.  Gastrointestinal: Negative for nausea, vomiting and abdominal pain.  Endocrine: Negative for polydipsia and polyuria.  Genitourinary: Negative for dysuria, urgency and frequency.  Musculoskeletal: Positive for arthralgias and joint swelling.       See history of present illness  Skin: Negative for rash.  Neurological: Negative for dizziness, weakness and light-headedness.      Allergies  Doxycycline and  Nsaids  Home Medications   Current Outpatient Rx  Name  Route  Sig  Dispense  Refill  . buprenorphine (SUBUTEX) 8 MG SUBL   Sublingual   Place 4 mg under the tongue 5 (five) times daily.         Marland Kitchen lisinopril-hydrochlorothiazide (PRINZIDE,ZESTORETIC) 10-12.5 MG per tablet   Oral   Take 1 tablet by mouth daily.   30 tablet   0     Pt needs office visit before running out   . Tapentadol HCl (NUCYNTA ER) 100 MG TB12   Oral   Take 1 tablet by mouth 2 (two) times daily.         Marland Kitchen tiZANidine (ZANAFLEX) 4 MG tablet   Oral   Take 4 mg by mouth every 6 (six) hours as needed for muscle spasms.         Marland Kitchen aspirin EC 81 MG tablet   Oral   Take 81 mg by mouth daily.         . citalopram (CELEXA) 40 MG tablet   Oral   Take 40 mg by mouth daily.          BP 129/81  Pulse 58  Temp(Src) 98.7 F (37.1 C) (Oral)  Resp 16  SpO2 98% Physical Exam  Nursing note and vitals reviewed. Constitutional: She is oriented to person, place, and time. Vital signs are normal. She appears well-developed and well-nourished. No distress.  HENT:  Head: Normocephalic and atraumatic.  Pulmonary/Chest: Effort normal. No respiratory distress.  Musculoskeletal:       Right wrist: She exhibits decreased range of motion, tenderness (diffuse tenderness across the dorsal wrist, worse  at the base of the thumb in the anatomic snuffbox), swelling and effusion (effusion around the anatomic snuff box area).       Right hand: She exhibits normal two-point discrimination and normal capillary refill. Normal sensation noted.  Neurological: She is alert and oriented to person, place, and time. She has normal strength. No sensory deficit. Coordination normal.  Skin: Skin is warm and dry. No rash noted. She is not diaphoretic.  Psychiatric: She has a normal mood and affect. Judgment normal.    ED Course  Procedures (including critical care time) Labs Review Labs Reviewed - No data to display Imaging  Review Dg Wrist Complete Right  03/03/2013   EXAM: RIGHT WRIST - COMPLETE 3+ VIEW  COMPARISON:  No prior.  FINDINGS: There is no evidence of fracture or dislocation. There is no evidence of arthropathy or other focal bone abnormality. Soft tissues are unremarkable.  IMPRESSION: Negative.   Electronically Signed   By: Marcello Moores  Register   On: 03/03/2013 20:10   Dg Hand Complete Right  03/03/2013   CLINICAL DATA:  [place] today, pain at radial side of wrist extending to fingers, soft tissue swelling  EXAM: 08/01/2012  COMPARISON:  None.  FINDINGS: Diffuse osseous demineralization.  Narrowing of third MCP joint.  Remaining joint spaces preserved.  No acute fracture, dislocation, or bone destruction.  IMPRESSION: No acute osseous abnormalities.  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   Electronically Signed   By: Lavonia Dana M.D.   On: 03/03/2013 20:09      MDM   Final diagnoses:  Scaphoid fracture of wrist     FOOSH, now right wrist pain and hematoma. Severe pain at the base of the thumb anatomic snuffbox. Looks like a scaphoid fracture to me, I disagree that the x-rays negative. Putting in a thumb spica splint and referred to orthopedics. She already has pain medicine.  Referred to ortho - Dr. Ruta Hinds, PA-C 03/04/13 [phone]

## 2013-03-03 NOTE — Progress Notes (Signed)
Orthopedic Tech Progress Note Patient Details:  Angela [name] 1945-08-21 [id number]  Ortho Devices Type of Ortho Device: Ace wrap;Thumb spica splint Splint Material: Fiberglass Ortho Device/Splint Location: RUE Ortho Device/Splint Interventions: Ordered;Application   Braulio Bosch 03/03/2013, 8:43 PM

## 2013-03-03 NOTE — ED Notes (Signed)
States slipped and fell on ice @ approx 1100 today, landing on RUE.  C/O right proximal hand severe pain and some swelling.  Upon palpation of forearm, reports pain occurs in hand/wrist.  Has been wearing Ace wrap.

## 2013-03-03 NOTE — Discharge Instructions (Signed)
Scaphoid Fracture A complete or incomplete break in the scaphoid bone of the hand is known as a scaphoid fracture. There is a poor supply of blood to the scaphoid bone, and this results in a poor rate of healing. SYMPTOMS   Usually, severe pain at the time of injury.  Pain, tenderness, swelling, and occasionally bruising around the fracture site.  Numbness, coldness, and swelling in the hand, causing pressure on the blood vessels or nerves (uncommon). CAUSES  Scaphoid fractures are caused by direct or indirect trauma to the hand. This may happen while falling on an outstretched arm.  RISK INCREASES WITH:  Participation in contact sports or jumping sports (football, soccer, basketball, boxing, and wrestling).  Sports in which falling onto outstretched hands is likely (snowboarding, skateboarding, or rollerblading).  History of bone or joint disease, including osteoporosis or previous hand immobilization. PREVENTION  Maintain physical fitness:  Cardiovascular fitness.  Forearm and wrist strength, flexibility, and endurance.  Wear fitted and padded protective equipment for the hand.  For sports in which falling is likely, wear fitted wrist protectors.  Learn and use proper technique when hitting, punching, or landing from a fall.  If you have had a previous injury, use tape or padding to protect your hand before participating in contact or jumping sports. PROGNOSIS Bone healing usually requires 4 to 5 months. If the bone does not heal, then surgery is necessary. This bone may heal in an average of 4 to 5 months with treatment and normal alignment. The bone may not heal, even if the position of the bones is normal. Surgery is often needed.  RELATED COMPLICATIONS   Fracture does not heal.  Heals in a bad position.  Impaired blood supply to the fracture and bones.  Chronic pain, stiffness, or swelling of the hand and wrist, especially with prolonged casting.  Excessive  bleeding in the hand, causing pressure and injury to nerves and blood vessels (rare).  Unstable or arthritic wrist joint following repeated injury or delayed treatment.  Shortening or injured bones.  Risks of surgery, including infection, bleeding, injury to nerves (numbness, weakness), nonunion, malunion, arthritis, and stiffness. TREATMENT Treatment varies depending on the severity of the injury. If the bone is out of alignment (displaced) then it must first be realigned (reduced). If the bone is in correct alignment ice and medicine can be used to help reduce pain and inflammation. The hand and wrist are then immobilized for a period of 4 to 5 months. If non-surgical (conservative) treatment is unsuccessful, surgery may be necessary. Surgery usually involves placing pins and screws in the bone. Pins and screws hold it in place. After surgery the hand and wrist are immobilized. After immobilization (with or without surgery), stretching and strengthening exercises is usually necessary to regain strength and a full range of motion. These exercises may be performed at home or with a therapist. Depending on the sport, a wrist brace may be recommended for wear when returning to sport. MEDICATION  If pain medicine is necessary, then nonsteroidal anti-inflammatory medicines, such as aspirin and ibuprofen, or other minor pain relievers, such as acetaminophen, are often recommended.  Do not take pain medicine for 7 days before surgery.  Prescription pain relievers are usually only prescribed after surgery. Use only as directed and only as much as you need. HEAT AND COLD Cold treatment (icing) relieves pain and reduces inflammation. Cold treatment should be applied for 10 to 15 minutes every 2 to 3 hours for inflammation and pain and immediately  after any activity that aggravates your symptoms. Use ice packs or an ice massage. SEEK MEDICAL CARE IF:   Pain, tenderness, or swelling worsens despite  treatment.  You experience pain, tingling, numbness, or coldness in the hand.  Blue, gray, or dusky color appears in the fingernails.  Any of the following occur after surgery: fever, increased pain, swelling, redness, drainage, or bleeding in the surgical area.  New, unexplained symptoms develop (drugs used in treatment may produce side effects). Document Released: 12/28/2004 Document Revised: 03/22/2011 Document Reviewed: 04/11/2008 [facility] Patient Information 2014 [place], [place].

## 2013-03-05 NOTE — ED Provider Notes (Signed)
Medical screening examination/treatment/procedure(s) were performed by non-physician practitioner and as supervising physician I was immediately available for consultation/collaboration.  Kiley Torrence, M.D.  Clanton Emanuelson C Treyven Lafauci, MD 03/05/13 0750 

## 2013-03-07 DIAGNOSIS — M25539 Pain in unspecified wrist: Secondary | ICD-10-CM | POA: Diagnosis not present

## 2013-03-07 DIAGNOSIS — R319 Hematuria, unspecified: Secondary | ICD-10-CM | POA: Diagnosis not present

## 2013-03-07 DIAGNOSIS — E538 Deficiency of other specified B group vitamins: Secondary | ICD-10-CM | POA: Diagnosis not present

## 2013-03-07 DIAGNOSIS — M810 Age-related osteoporosis without current pathological fracture: Secondary | ICD-10-CM | POA: Diagnosis not present

## 2013-03-07 DIAGNOSIS — I10 Essential (primary) hypertension: Secondary | ICD-10-CM | POA: Diagnosis not present

## 2013-03-07 DIAGNOSIS — E039 Hypothyroidism, unspecified: Secondary | ICD-10-CM | POA: Diagnosis not present

## 2013-03-12 DIAGNOSIS — M26609 Unspecified temporomandibular joint disorder, unspecified side: Secondary | ICD-10-CM | POA: Diagnosis not present

## 2013-03-12 DIAGNOSIS — G894 Chronic pain syndrome: Secondary | ICD-10-CM | POA: Diagnosis not present

## 2013-03-12 DIAGNOSIS — Z79899 Other long term (current) drug therapy: Secondary | ICD-10-CM | POA: Diagnosis not present

## 2013-03-14 DIAGNOSIS — I10 Essential (primary) hypertension: Secondary | ICD-10-CM | POA: Diagnosis not present

## 2013-03-14 DIAGNOSIS — R6884 Jaw pain: Secondary | ICD-10-CM | POA: Diagnosis not present

## 2013-03-14 DIAGNOSIS — G4733 Obstructive sleep apnea (adult) (pediatric): Secondary | ICD-10-CM | POA: Diagnosis not present

## 2013-03-14 DIAGNOSIS — R609 Edema, unspecified: Secondary | ICD-10-CM | POA: Diagnosis not present

## 2013-03-26 DIAGNOSIS — S52599A Other fractures of lower end of unspecified radius, initial encounter for closed fracture: Secondary | ICD-10-CM | POA: Diagnosis not present

## 2013-03-28 DIAGNOSIS — R141 Gas pain: Secondary | ICD-10-CM | POA: Diagnosis not present

## 2013-03-28 DIAGNOSIS — E039 Hypothyroidism, unspecified: Secondary | ICD-10-CM | POA: Diagnosis not present

## 2013-03-28 DIAGNOSIS — L659 Nonscarring hair loss, unspecified: Secondary | ICD-10-CM | POA: Diagnosis not present

## 2013-03-28 DIAGNOSIS — D649 Anemia, unspecified: Secondary | ICD-10-CM | POA: Diagnosis not present

## 2013-04-09 DIAGNOSIS — G894 Chronic pain syndrome: Secondary | ICD-10-CM | POA: Diagnosis not present

## 2013-04-09 DIAGNOSIS — Z79899 Other long term (current) drug therapy: Secondary | ICD-10-CM | POA: Diagnosis not present

## 2013-04-09 DIAGNOSIS — M26609 Unspecified temporomandibular joint disorder, unspecified side: Secondary | ICD-10-CM | POA: Diagnosis not present

## 2013-04-09 DIAGNOSIS — G501 Atypical facial pain: Secondary | ICD-10-CM | POA: Diagnosis not present

## 2013-04-18 DIAGNOSIS — S52599A Other fractures of lower end of unspecified radius, initial encounter for closed fracture: Secondary | ICD-10-CM | POA: Diagnosis not present

## 2013-05-07 DIAGNOSIS — M5030 Other cervical disc degeneration, unspecified cervical region: Secondary | ICD-10-CM | POA: Diagnosis not present

## 2013-05-07 DIAGNOSIS — G894 Chronic pain syndrome: Secondary | ICD-10-CM | POA: Diagnosis not present

## 2013-05-07 DIAGNOSIS — G501 Atypical facial pain: Secondary | ICD-10-CM | POA: Diagnosis not present

## 2013-05-07 DIAGNOSIS — Z79899 Other long term (current) drug therapy: Secondary | ICD-10-CM | POA: Diagnosis not present

## 2013-05-14 ENCOUNTER — Other Ambulatory Visit: Payer: Self-pay | Admitting: Internal Medicine

## 2013-05-14 DIAGNOSIS — L0591 Pilonidal cyst without abscess: Secondary | ICD-10-CM | POA: Diagnosis not present

## 2013-05-15 ENCOUNTER — Ambulatory Visit
Admission: RE | Admit: 2013-05-15 | Discharge: 2013-05-15 | Disposition: A | Discharge status: Home / Self Care | Payer: Medicare Other | Source: Ambulatory Visit | Attending: Internal Medicine | Admitting: Internal Medicine

## 2013-05-15 DIAGNOSIS — R1900 Intra-abdominal and pelvic swelling, mass and lump, unspecified site: Secondary | ICD-10-CM | POA: Diagnosis not present

## 2013-05-15 DIAGNOSIS — L0591 Pilonidal cyst without abscess: Secondary | ICD-10-CM

## 2013-05-28 DIAGNOSIS — R51 Headache: Secondary | ICD-10-CM | POA: Diagnosis not present

## 2013-05-28 DIAGNOSIS — G501 Atypical facial pain: Secondary | ICD-10-CM | POA: Diagnosis not present

## 2013-05-28 DIAGNOSIS — Z79899 Other long term (current) drug therapy: Secondary | ICD-10-CM | POA: Diagnosis not present

## 2013-05-28 DIAGNOSIS — G894 Chronic pain syndrome: Secondary | ICD-10-CM | POA: Diagnosis not present

## 2013-06-08 DIAGNOSIS — E039 Hypothyroidism, unspecified: Secondary | ICD-10-CM | POA: Diagnosis not present

## 2013-06-13 DIAGNOSIS — R32 Unspecified urinary incontinence: Secondary | ICD-10-CM | POA: Diagnosis not present

## 2013-06-14 ENCOUNTER — Other Ambulatory Visit: Payer: Self-pay | Admitting: Dermatology

## 2013-06-14 DIAGNOSIS — L570 Actinic keratosis: Secondary | ICD-10-CM | POA: Diagnosis not present

## 2013-06-14 DIAGNOSIS — Z85828 Personal history of other malignant neoplasm of skin: Secondary | ICD-10-CM | POA: Diagnosis not present

## 2013-06-14 DIAGNOSIS — C44611 Basal cell carcinoma of skin of unspecified upper limb, including shoulder: Secondary | ICD-10-CM | POA: Diagnosis not present

## 2013-06-14 DIAGNOSIS — C44711 Basal cell carcinoma of skin of unspecified lower limb, including hip: Secondary | ICD-10-CM | POA: Diagnosis not present

## 2013-06-14 DIAGNOSIS — L819 Disorder of pigmentation, unspecified: Secondary | ICD-10-CM | POA: Diagnosis not present

## 2013-06-14 DIAGNOSIS — C44319 Basal cell carcinoma of skin of other parts of face: Secondary | ICD-10-CM | POA: Diagnosis not present

## 2013-06-18 DIAGNOSIS — M2550 Pain in unspecified joint: Secondary | ICD-10-CM | POA: Diagnosis not present

## 2013-06-18 DIAGNOSIS — M26609 Unspecified temporomandibular joint disorder, unspecified side: Secondary | ICD-10-CM | POA: Diagnosis not present

## 2013-06-18 DIAGNOSIS — M2652 Limited mandibular range of motion: Secondary | ICD-10-CM | POA: Diagnosis not present

## 2013-06-18 DIAGNOSIS — IMO0001 Reserved for inherently not codable concepts without codable children: Secondary | ICD-10-CM | POA: Diagnosis not present

## 2013-06-25 DIAGNOSIS — Z79899 Other long term (current) drug therapy: Secondary | ICD-10-CM | POA: Diagnosis not present

## 2013-06-25 DIAGNOSIS — G894 Chronic pain syndrome: Secondary | ICD-10-CM | POA: Diagnosis not present

## 2013-06-25 DIAGNOSIS — M26609 Unspecified temporomandibular joint disorder, unspecified side: Secondary | ICD-10-CM | POA: Diagnosis not present

## 2013-06-25 DIAGNOSIS — M531 Cervicobrachial syndrome: Secondary | ICD-10-CM | POA: Diagnosis not present

## 2013-07-23 DIAGNOSIS — G894 Chronic pain syndrome: Secondary | ICD-10-CM | POA: Diagnosis not present

## 2013-07-23 DIAGNOSIS — Z79899 Other long term (current) drug therapy: Secondary | ICD-10-CM | POA: Diagnosis not present

## 2013-07-23 DIAGNOSIS — G609 Hereditary and idiopathic neuropathy, unspecified: Secondary | ICD-10-CM | POA: Diagnosis not present

## 2013-07-23 DIAGNOSIS — M26609 Unspecified temporomandibular joint disorder, unspecified side: Secondary | ICD-10-CM | POA: Diagnosis not present

## 2013-07-31 DIAGNOSIS — E039 Hypothyroidism, unspecified: Secondary | ICD-10-CM | POA: Diagnosis not present

## 2013-08-02 DIAGNOSIS — E039 Hypothyroidism, unspecified: Secondary | ICD-10-CM | POA: Diagnosis not present

## 2013-08-27 DIAGNOSIS — M47812 Spondylosis without myelopathy or radiculopathy, cervical region: Secondary | ICD-10-CM | POA: Diagnosis not present

## 2013-08-27 DIAGNOSIS — M79609 Pain in unspecified limb: Secondary | ICD-10-CM | POA: Diagnosis not present

## 2013-08-27 DIAGNOSIS — IMO0001 Reserved for inherently not codable concepts without codable children: Secondary | ICD-10-CM | POA: Diagnosis not present

## 2013-08-27 DIAGNOSIS — M531 Cervicobrachial syndrome: Secondary | ICD-10-CM | POA: Diagnosis not present

## 2013-08-28 DIAGNOSIS — C4491 Basal cell carcinoma of skin, unspecified: Secondary | ICD-10-CM | POA: Diagnosis not present

## 2013-08-28 DIAGNOSIS — D2310 Other benign neoplasm of skin of unspecified eyelid, including canthus: Secondary | ICD-10-CM | POA: Diagnosis not present

## 2013-08-28 DIAGNOSIS — H2510 Age-related nuclear cataract, unspecified eye: Secondary | ICD-10-CM | POA: Diagnosis not present

## 2013-08-28 DIAGNOSIS — Z85828 Personal history of other malignant neoplasm of skin: Secondary | ICD-10-CM | POA: Diagnosis not present

## 2013-08-28 DIAGNOSIS — H04129 Dry eye syndrome of unspecified lacrimal gland: Secondary | ICD-10-CM | POA: Diagnosis not present

## 2013-09-13 DIAGNOSIS — E039 Hypothyroidism, unspecified: Secondary | ICD-10-CM | POA: Diagnosis not present

## 2013-09-13 DIAGNOSIS — Z1331 Encounter for screening for depression: Secondary | ICD-10-CM | POA: Diagnosis not present

## 2013-09-13 DIAGNOSIS — Z0000 Encounter for general adult medical examination without abnormal findings: Secondary | ICD-10-CM | POA: Diagnosis not present

## 2013-09-13 DIAGNOSIS — I10 Essential (primary) hypertension: Secondary | ICD-10-CM | POA: Diagnosis not present

## 2013-09-13 DIAGNOSIS — E538 Deficiency of other specified B group vitamins: Secondary | ICD-10-CM | POA: Diagnosis not present

## 2013-09-13 DIAGNOSIS — M810 Age-related osteoporosis without current pathological fracture: Secondary | ICD-10-CM | POA: Diagnosis not present

## 2013-09-14 ENCOUNTER — Other Ambulatory Visit: Payer: Self-pay | Admitting: Dermatology

## 2013-09-14 DIAGNOSIS — L439 Lichen planus, unspecified: Secondary | ICD-10-CM | POA: Diagnosis not present

## 2013-09-14 DIAGNOSIS — C44711 Basal cell carcinoma of skin of unspecified lower limb, including hip: Secondary | ICD-10-CM | POA: Diagnosis not present

## 2013-09-14 DIAGNOSIS — D239 Other benign neoplasm of skin, unspecified: Secondary | ICD-10-CM | POA: Diagnosis not present

## 2013-09-14 DIAGNOSIS — C44319 Basal cell carcinoma of skin of other parts of face: Secondary | ICD-10-CM | POA: Diagnosis not present

## 2013-09-14 DIAGNOSIS — Z85828 Personal history of other malignant neoplasm of skin: Secondary | ICD-10-CM | POA: Diagnosis not present

## 2013-09-14 DIAGNOSIS — L821 Other seborrheic keratosis: Secondary | ICD-10-CM | POA: Diagnosis not present

## 2013-09-14 DIAGNOSIS — C44519 Basal cell carcinoma of skin of other part of trunk: Secondary | ICD-10-CM | POA: Diagnosis not present

## 2013-09-14 DIAGNOSIS — L570 Actinic keratosis: Secondary | ICD-10-CM | POA: Diagnosis not present

## 2013-09-14 DIAGNOSIS — D0470 Carcinoma in situ of skin of unspecified lower limb, including hip: Secondary | ICD-10-CM | POA: Diagnosis not present

## 2013-09-18 DIAGNOSIS — C44319 Basal cell carcinoma of skin of other parts of face: Secondary | ICD-10-CM | POA: Diagnosis not present

## 2013-09-18 DIAGNOSIS — Z85828 Personal history of other malignant neoplasm of skin: Secondary | ICD-10-CM | POA: Diagnosis not present

## 2013-09-20 DIAGNOSIS — I10 Essential (primary) hypertension: Secondary | ICD-10-CM | POA: Diagnosis not present

## 2013-09-20 DIAGNOSIS — R609 Edema, unspecified: Secondary | ICD-10-CM | POA: Diagnosis not present

## 2013-09-20 DIAGNOSIS — G4733 Obstructive sleep apnea (adult) (pediatric): Secondary | ICD-10-CM | POA: Diagnosis not present

## 2013-09-20 DIAGNOSIS — E039 Hypothyroidism, unspecified: Secondary | ICD-10-CM | POA: Diagnosis not present

## 2013-09-24 DIAGNOSIS — G894 Chronic pain syndrome: Secondary | ICD-10-CM | POA: Diagnosis not present

## 2013-09-24 DIAGNOSIS — G501 Atypical facial pain: Secondary | ICD-10-CM | POA: Diagnosis not present

## 2013-09-24 DIAGNOSIS — Z79899 Other long term (current) drug therapy: Secondary | ICD-10-CM | POA: Diagnosis not present

## 2013-09-27 DIAGNOSIS — Z85828 Personal history of other malignant neoplasm of skin: Secondary | ICD-10-CM | POA: Diagnosis not present

## 2013-09-27 DIAGNOSIS — C44319 Basal cell carcinoma of skin of other parts of face: Secondary | ICD-10-CM | POA: Diagnosis not present

## 2013-10-26 DIAGNOSIS — Z23 Encounter for immunization: Secondary | ICD-10-CM | POA: Diagnosis not present

## 2013-10-29 DIAGNOSIS — Z79899 Other long term (current) drug therapy: Secondary | ICD-10-CM | POA: Diagnosis not present

## 2013-10-29 DIAGNOSIS — G894 Chronic pain syndrome: Secondary | ICD-10-CM | POA: Diagnosis not present

## 2013-10-29 DIAGNOSIS — M5408 Panniculitis affecting regions of neck and back, sacral and sacrococcygeal region: Secondary | ICD-10-CM | POA: Diagnosis not present

## 2013-10-29 DIAGNOSIS — M5137 Other intervertebral disc degeneration, lumbosacral region: Secondary | ICD-10-CM | POA: Diagnosis not present

## 2013-10-29 DIAGNOSIS — G5790 Unspecified mononeuropathy of unspecified lower limb: Secondary | ICD-10-CM | POA: Diagnosis not present

## 2013-10-29 DIAGNOSIS — M719 Bursopathy, unspecified: Secondary | ICD-10-CM | POA: Diagnosis not present

## 2013-11-06 DIAGNOSIS — M2660 Temporomandibular joint disorder, unspecified: Secondary | ICD-10-CM | POA: Diagnosis not present

## 2013-11-07 DIAGNOSIS — E039 Hypothyroidism, unspecified: Secondary | ICD-10-CM | POA: Diagnosis not present

## 2013-11-13 ENCOUNTER — Other Ambulatory Visit: Payer: Self-pay | Admitting: Dermatology

## 2013-11-13 DIAGNOSIS — C44319 Basal cell carcinoma of skin of other parts of face: Secondary | ICD-10-CM | POA: Diagnosis not present

## 2013-11-13 DIAGNOSIS — C44722 Squamous cell carcinoma of skin of right lower limb, including hip: Secondary | ICD-10-CM | POA: Diagnosis not present

## 2013-11-13 DIAGNOSIS — L814 Other melanin hyperpigmentation: Secondary | ICD-10-CM | POA: Diagnosis not present

## 2013-11-13 DIAGNOSIS — C44519 Basal cell carcinoma of skin of other part of trunk: Secondary | ICD-10-CM | POA: Diagnosis not present

## 2013-11-13 DIAGNOSIS — L570 Actinic keratosis: Secondary | ICD-10-CM | POA: Diagnosis not present

## 2013-11-13 DIAGNOSIS — Z85828 Personal history of other malignant neoplasm of skin: Secondary | ICD-10-CM | POA: Diagnosis not present

## 2013-11-13 DIAGNOSIS — L821 Other seborrheic keratosis: Secondary | ICD-10-CM | POA: Diagnosis not present

## 2013-11-19 DIAGNOSIS — M791 Myalgia: Secondary | ICD-10-CM | POA: Diagnosis not present

## 2013-11-19 DIAGNOSIS — M2660 Temporomandibular joint disorder, unspecified: Secondary | ICD-10-CM | POA: Diagnosis not present

## 2013-11-19 DIAGNOSIS — M2652 Limited mandibular range of motion: Secondary | ICD-10-CM | POA: Diagnosis not present

## 2013-11-19 DIAGNOSIS — M2662 Arthralgia of temporomandibular joint: Secondary | ICD-10-CM | POA: Diagnosis not present

## 2013-11-26 DIAGNOSIS — Z79891 Long term (current) use of opiate analgesic: Secondary | ICD-10-CM | POA: Diagnosis not present

## 2013-11-26 DIAGNOSIS — Z79899 Other long term (current) drug therapy: Secondary | ICD-10-CM | POA: Diagnosis not present

## 2013-11-26 DIAGNOSIS — M2660 Temporomandibular joint disorder, unspecified: Secondary | ICD-10-CM | POA: Diagnosis not present

## 2013-11-26 DIAGNOSIS — G894 Chronic pain syndrome: Secondary | ICD-10-CM | POA: Diagnosis not present

## 2013-11-29 DIAGNOSIS — E038 Other specified hypothyroidism: Secondary | ICD-10-CM | POA: Diagnosis not present

## 2013-11-29 DIAGNOSIS — R5383 Other fatigue: Secondary | ICD-10-CM | POA: Diagnosis not present

## 2013-12-24 DIAGNOSIS — M791 Myalgia: Secondary | ICD-10-CM | POA: Diagnosis not present

## 2013-12-24 DIAGNOSIS — Z79899 Other long term (current) drug therapy: Secondary | ICD-10-CM | POA: Diagnosis not present

## 2013-12-24 DIAGNOSIS — M545 Low back pain: Secondary | ICD-10-CM | POA: Diagnosis not present

## 2013-12-24 DIAGNOSIS — M5030 Other cervical disc degeneration, unspecified cervical region: Secondary | ICD-10-CM | POA: Diagnosis not present

## 2013-12-24 DIAGNOSIS — Z79891 Long term (current) use of opiate analgesic: Secondary | ICD-10-CM | POA: Diagnosis not present

## 2013-12-24 DIAGNOSIS — G894 Chronic pain syndrome: Secondary | ICD-10-CM | POA: Diagnosis not present

## 2013-12-24 DIAGNOSIS — M5137 Other intervertebral disc degeneration, lumbosacral region: Secondary | ICD-10-CM | POA: Diagnosis not present

## 2014-01-15 ENCOUNTER — Other Ambulatory Visit: Payer: Self-pay | Admitting: Dermatology

## 2014-01-15 DIAGNOSIS — Z85828 Personal history of other malignant neoplasm of skin: Secondary | ICD-10-CM | POA: Diagnosis not present

## 2014-01-15 DIAGNOSIS — C4441 Basal cell carcinoma of skin of scalp and neck: Secondary | ICD-10-CM | POA: Diagnosis not present

## 2014-01-15 DIAGNOSIS — L821 Other seborrheic keratosis: Secondary | ICD-10-CM | POA: Diagnosis not present

## 2014-01-15 DIAGNOSIS — L820 Inflamed seborrheic keratosis: Secondary | ICD-10-CM | POA: Diagnosis not present

## 2014-01-15 DIAGNOSIS — L570 Actinic keratosis: Secondary | ICD-10-CM | POA: Diagnosis not present

## 2014-01-15 DIAGNOSIS — C44719 Basal cell carcinoma of skin of left lower limb, including hip: Secondary | ICD-10-CM | POA: Diagnosis not present

## 2014-01-15 DIAGNOSIS — D485 Neoplasm of uncertain behavior of skin: Secondary | ICD-10-CM | POA: Diagnosis not present

## 2014-01-15 DIAGNOSIS — D2272 Melanocytic nevi of left lower limb, including hip: Secondary | ICD-10-CM | POA: Diagnosis not present

## 2014-01-15 DIAGNOSIS — C44619 Basal cell carcinoma of skin of left upper limb, including shoulder: Secondary | ICD-10-CM | POA: Diagnosis not present

## 2014-01-15 DIAGNOSIS — D225 Melanocytic nevi of trunk: Secondary | ICD-10-CM | POA: Diagnosis not present

## 2014-01-15 DIAGNOSIS — C44519 Basal cell carcinoma of skin of other part of trunk: Secondary | ICD-10-CM | POA: Diagnosis not present

## 2014-02-04 DIAGNOSIS — Z79891 Long term (current) use of opiate analgesic: Secondary | ICD-10-CM | POA: Diagnosis not present

## 2014-02-04 DIAGNOSIS — G894 Chronic pain syndrome: Secondary | ICD-10-CM | POA: Diagnosis not present

## 2014-02-04 DIAGNOSIS — Z79899 Other long term (current) drug therapy: Secondary | ICD-10-CM | POA: Diagnosis not present

## 2014-02-04 DIAGNOSIS — M2669 Other specified disorders of temporomandibular joint: Secondary | ICD-10-CM | POA: Diagnosis not present

## 2014-02-15 DIAGNOSIS — E039 Hypothyroidism, unspecified: Secondary | ICD-10-CM | POA: Diagnosis not present

## 2014-02-15 DIAGNOSIS — I5032 Chronic diastolic (congestive) heart failure: Secondary | ICD-10-CM | POA: Diagnosis not present

## 2014-02-15 DIAGNOSIS — F419 Anxiety disorder, unspecified: Secondary | ICD-10-CM | POA: Diagnosis not present

## 2014-02-15 DIAGNOSIS — R110 Nausea: Secondary | ICD-10-CM | POA: Diagnosis not present

## 2014-02-26 DIAGNOSIS — H04123 Dry eye syndrome of bilateral lacrimal glands: Secondary | ICD-10-CM | POA: Diagnosis not present

## 2014-02-26 DIAGNOSIS — H18601 Keratoconus, unspecified, right eye: Secondary | ICD-10-CM | POA: Diagnosis not present

## 2014-03-04 DIAGNOSIS — Z79899 Other long term (current) drug therapy: Secondary | ICD-10-CM | POA: Diagnosis not present

## 2014-03-04 DIAGNOSIS — G894 Chronic pain syndrome: Secondary | ICD-10-CM | POA: Diagnosis not present

## 2014-03-04 DIAGNOSIS — M2669 Other specified disorders of temporomandibular joint: Secondary | ICD-10-CM | POA: Diagnosis not present

## 2014-03-19 ENCOUNTER — Other Ambulatory Visit: Payer: Self-pay | Admitting: Dermatology

## 2014-03-19 DIAGNOSIS — L570 Actinic keratosis: Secondary | ICD-10-CM | POA: Diagnosis not present

## 2014-03-19 DIAGNOSIS — L814 Other melanin hyperpigmentation: Secondary | ICD-10-CM | POA: Diagnosis not present

## 2014-03-19 DIAGNOSIS — D2271 Melanocytic nevi of right lower limb, including hip: Secondary | ICD-10-CM | POA: Diagnosis not present

## 2014-03-19 DIAGNOSIS — C44519 Basal cell carcinoma of skin of other part of trunk: Secondary | ICD-10-CM | POA: Diagnosis not present

## 2014-03-19 DIAGNOSIS — C44619 Basal cell carcinoma of skin of left upper limb, including shoulder: Secondary | ICD-10-CM | POA: Diagnosis not present

## 2014-03-19 DIAGNOSIS — L821 Other seborrheic keratosis: Secondary | ICD-10-CM | POA: Diagnosis not present

## 2014-03-19 DIAGNOSIS — D2261 Melanocytic nevi of right upper limb, including shoulder: Secondary | ICD-10-CM | POA: Diagnosis not present

## 2014-03-19 DIAGNOSIS — L820 Inflamed seborrheic keratosis: Secondary | ICD-10-CM | POA: Diagnosis not present

## 2014-03-19 DIAGNOSIS — D2262 Melanocytic nevi of left upper limb, including shoulder: Secondary | ICD-10-CM | POA: Diagnosis not present

## 2014-03-19 DIAGNOSIS — D2272 Melanocytic nevi of left lower limb, including hip: Secondary | ICD-10-CM | POA: Diagnosis not present

## 2014-03-19 DIAGNOSIS — Z85828 Personal history of other malignant neoplasm of skin: Secondary | ICD-10-CM | POA: Diagnosis not present

## 2014-03-19 DIAGNOSIS — C44712 Basal cell carcinoma of skin of right lower limb, including hip: Secondary | ICD-10-CM | POA: Diagnosis not present

## 2014-04-01 DIAGNOSIS — M2652 Limited mandibular range of motion: Secondary | ICD-10-CM | POA: Diagnosis not present

## 2014-04-01 DIAGNOSIS — M2660 Temporomandibular joint disorder, unspecified: Secondary | ICD-10-CM | POA: Diagnosis not present

## 2014-04-01 DIAGNOSIS — Z79899 Other long term (current) drug therapy: Secondary | ICD-10-CM | POA: Diagnosis not present

## 2014-04-01 DIAGNOSIS — M791 Myalgia: Secondary | ICD-10-CM | POA: Diagnosis not present

## 2014-04-01 DIAGNOSIS — M2662 Arthralgia of temporomandibular joint: Secondary | ICD-10-CM | POA: Diagnosis not present

## 2014-04-01 DIAGNOSIS — G894 Chronic pain syndrome: Secondary | ICD-10-CM | POA: Diagnosis not present

## 2014-04-01 DIAGNOSIS — M2669 Other specified disorders of temporomandibular joint: Secondary | ICD-10-CM | POA: Diagnosis not present

## 2014-04-29 DIAGNOSIS — M2669 Other specified disorders of temporomandibular joint: Secondary | ICD-10-CM | POA: Diagnosis not present

## 2014-04-29 DIAGNOSIS — G894 Chronic pain syndrome: Secondary | ICD-10-CM | POA: Diagnosis not present

## 2014-04-29 DIAGNOSIS — R51 Headache: Secondary | ICD-10-CM | POA: Diagnosis not present

## 2014-04-29 DIAGNOSIS — Z79899 Other long term (current) drug therapy: Secondary | ICD-10-CM | POA: Diagnosis not present

## 2014-05-14 ENCOUNTER — Other Ambulatory Visit: Payer: Self-pay | Admitting: Dermatology

## 2014-05-14 DIAGNOSIS — Z85828 Personal history of other malignant neoplasm of skin: Secondary | ICD-10-CM | POA: Diagnosis not present

## 2014-05-14 DIAGNOSIS — C44519 Basal cell carcinoma of skin of other part of trunk: Secondary | ICD-10-CM | POA: Diagnosis not present

## 2014-05-14 DIAGNOSIS — L814 Other melanin hyperpigmentation: Secondary | ICD-10-CM | POA: Diagnosis not present

## 2014-05-14 DIAGNOSIS — D225 Melanocytic nevi of trunk: Secondary | ICD-10-CM | POA: Diagnosis not present

## 2014-05-14 DIAGNOSIS — L821 Other seborrheic keratosis: Secondary | ICD-10-CM | POA: Diagnosis not present

## 2014-05-14 DIAGNOSIS — L570 Actinic keratosis: Secondary | ICD-10-CM | POA: Diagnosis not present

## 2014-05-23 DIAGNOSIS — Z1231 Encounter for screening mammogram for malignant neoplasm of breast: Secondary | ICD-10-CM | POA: Diagnosis not present

## 2014-05-23 DIAGNOSIS — Z803 Family history of malignant neoplasm of breast: Secondary | ICD-10-CM | POA: Diagnosis not present

## 2014-05-28 DIAGNOSIS — Z803 Family history of malignant neoplasm of breast: Secondary | ICD-10-CM | POA: Diagnosis not present

## 2014-05-28 DIAGNOSIS — R928 Other abnormal and inconclusive findings on diagnostic imaging of breast: Secondary | ICD-10-CM | POA: Diagnosis not present

## 2014-05-28 DIAGNOSIS — R922 Inconclusive mammogram: Secondary | ICD-10-CM | POA: Diagnosis not present

## 2014-06-03 DIAGNOSIS — R51 Headache: Secondary | ICD-10-CM | POA: Diagnosis not present

## 2014-06-03 DIAGNOSIS — Z79899 Other long term (current) drug therapy: Secondary | ICD-10-CM | POA: Diagnosis not present

## 2014-06-03 DIAGNOSIS — M2669 Other specified disorders of temporomandibular joint: Secondary | ICD-10-CM | POA: Diagnosis not present

## 2014-06-03 DIAGNOSIS — G894 Chronic pain syndrome: Secondary | ICD-10-CM | POA: Diagnosis not present

## 2014-07-01 DIAGNOSIS — M2669 Other specified disorders of temporomandibular joint: Secondary | ICD-10-CM | POA: Diagnosis not present

## 2014-07-01 DIAGNOSIS — R51 Headache: Secondary | ICD-10-CM | POA: Diagnosis not present

## 2014-07-01 DIAGNOSIS — G894 Chronic pain syndrome: Secondary | ICD-10-CM | POA: Diagnosis not present

## 2014-07-01 DIAGNOSIS — Z79899 Other long term (current) drug therapy: Secondary | ICD-10-CM | POA: Diagnosis not present

## 2014-07-23 DIAGNOSIS — L821 Other seborrheic keratosis: Secondary | ICD-10-CM | POA: Diagnosis not present

## 2014-07-23 DIAGNOSIS — Z85828 Personal history of other malignant neoplasm of skin: Secondary | ICD-10-CM | POA: Diagnosis not present

## 2014-07-23 DIAGNOSIS — L578 Other skin changes due to chronic exposure to nonionizing radiation: Secondary | ICD-10-CM | POA: Diagnosis not present

## 2014-07-23 DIAGNOSIS — L570 Actinic keratosis: Secondary | ICD-10-CM | POA: Diagnosis not present

## 2014-07-23 DIAGNOSIS — D485 Neoplasm of uncertain behavior of skin: Secondary | ICD-10-CM | POA: Diagnosis not present

## 2014-07-23 DIAGNOSIS — D224 Melanocytic nevi of scalp and neck: Secondary | ICD-10-CM | POA: Diagnosis not present

## 2014-07-31 DIAGNOSIS — N182 Chronic kidney disease, stage 2 (mild): Secondary | ICD-10-CM | POA: Diagnosis not present

## 2014-07-31 DIAGNOSIS — E039 Hypothyroidism, unspecified: Secondary | ICD-10-CM | POA: Diagnosis not present

## 2014-07-31 DIAGNOSIS — I5032 Chronic diastolic (congestive) heart failure: Secondary | ICD-10-CM | POA: Diagnosis not present

## 2014-07-31 DIAGNOSIS — I129 Hypertensive chronic kidney disease with stage 1 through stage 4 chronic kidney disease, or unspecified chronic kidney disease: Secondary | ICD-10-CM | POA: Diagnosis not present

## 2014-08-01 DIAGNOSIS — G501 Atypical facial pain: Secondary | ICD-10-CM | POA: Diagnosis not present

## 2014-08-01 DIAGNOSIS — G894 Chronic pain syndrome: Secondary | ICD-10-CM | POA: Diagnosis not present

## 2014-08-01 DIAGNOSIS — M2660 Temporomandibular joint disorder, unspecified: Secondary | ICD-10-CM | POA: Diagnosis not present

## 2014-08-07 DIAGNOSIS — E039 Hypothyroidism, unspecified: Secondary | ICD-10-CM | POA: Diagnosis not present

## 2014-08-13 DIAGNOSIS — I5032 Chronic diastolic (congestive) heart failure: Secondary | ICD-10-CM | POA: Diagnosis not present

## 2014-08-13 DIAGNOSIS — E538 Deficiency of other specified B group vitamins: Secondary | ICD-10-CM | POA: Diagnosis not present

## 2014-08-28 DIAGNOSIS — Z79899 Other long term (current) drug therapy: Secondary | ICD-10-CM | POA: Diagnosis not present

## 2014-08-28 DIAGNOSIS — G894 Chronic pain syndrome: Secondary | ICD-10-CM | POA: Diagnosis not present

## 2014-08-28 DIAGNOSIS — R51 Headache: Secondary | ICD-10-CM | POA: Diagnosis not present

## 2014-08-28 DIAGNOSIS — M2669 Other specified disorders of temporomandibular joint: Secondary | ICD-10-CM | POA: Diagnosis not present

## 2014-09-06 DIAGNOSIS — Z0100 Encounter for examination of eyes and vision without abnormal findings: Secondary | ICD-10-CM | POA: Diagnosis not present

## 2014-09-06 DIAGNOSIS — H2513 Age-related nuclear cataract, bilateral: Secondary | ICD-10-CM | POA: Diagnosis not present

## 2014-09-23 DIAGNOSIS — G894 Chronic pain syndrome: Secondary | ICD-10-CM | POA: Diagnosis not present

## 2014-09-23 DIAGNOSIS — Z79899 Other long term (current) drug therapy: Secondary | ICD-10-CM | POA: Diagnosis not present

## 2014-09-23 DIAGNOSIS — R51 Headache: Secondary | ICD-10-CM | POA: Diagnosis not present

## 2014-09-23 DIAGNOSIS — M2669 Other specified disorders of temporomandibular joint: Secondary | ICD-10-CM | POA: Diagnosis not present

## 2014-09-24 DIAGNOSIS — Z85828 Personal history of other malignant neoplasm of skin: Secondary | ICD-10-CM | POA: Diagnosis not present

## 2014-09-24 DIAGNOSIS — C44319 Basal cell carcinoma of skin of other parts of face: Secondary | ICD-10-CM | POA: Diagnosis not present

## 2014-09-24 DIAGNOSIS — L814 Other melanin hyperpigmentation: Secondary | ICD-10-CM | POA: Diagnosis not present

## 2014-09-24 DIAGNOSIS — L821 Other seborrheic keratosis: Secondary | ICD-10-CM | POA: Diagnosis not present

## 2014-09-24 DIAGNOSIS — D235 Other benign neoplasm of skin of trunk: Secondary | ICD-10-CM | POA: Diagnosis not present

## 2014-09-24 DIAGNOSIS — D2271 Melanocytic nevi of right lower limb, including hip: Secondary | ICD-10-CM | POA: Diagnosis not present

## 2014-09-24 DIAGNOSIS — L570 Actinic keratosis: Secondary | ICD-10-CM | POA: Diagnosis not present

## 2014-09-24 DIAGNOSIS — D1801 Hemangioma of skin and subcutaneous tissue: Secondary | ICD-10-CM | POA: Diagnosis not present

## 2014-09-24 DIAGNOSIS — D225 Melanocytic nevi of trunk: Secondary | ICD-10-CM | POA: Diagnosis not present

## 2014-10-18 DIAGNOSIS — M792 Neuralgia and neuritis, unspecified: Secondary | ICD-10-CM | POA: Diagnosis not present

## 2014-10-18 DIAGNOSIS — G894 Chronic pain syndrome: Secondary | ICD-10-CM | POA: Diagnosis not present

## 2014-10-18 DIAGNOSIS — Z79899 Other long term (current) drug therapy: Secondary | ICD-10-CM | POA: Diagnosis not present

## 2014-10-18 DIAGNOSIS — R51 Headache: Secondary | ICD-10-CM | POA: Diagnosis not present

## 2014-10-18 DIAGNOSIS — M542 Cervicalgia: Secondary | ICD-10-CM | POA: Diagnosis not present

## 2014-10-23 DIAGNOSIS — C44119 Basal cell carcinoma of skin of left eyelid, including canthus: Secondary | ICD-10-CM | POA: Diagnosis not present

## 2014-10-23 DIAGNOSIS — Z85828 Personal history of other malignant neoplasm of skin: Secondary | ICD-10-CM | POA: Diagnosis not present

## 2014-11-15 DIAGNOSIS — G894 Chronic pain syndrome: Secondary | ICD-10-CM | POA: Diagnosis not present

## 2014-11-15 DIAGNOSIS — Z79899 Other long term (current) drug therapy: Secondary | ICD-10-CM | POA: Diagnosis not present

## 2014-11-15 DIAGNOSIS — M542 Cervicalgia: Secondary | ICD-10-CM | POA: Diagnosis not present

## 2014-11-15 DIAGNOSIS — M792 Neuralgia and neuritis, unspecified: Secondary | ICD-10-CM | POA: Diagnosis not present

## 2014-11-15 DIAGNOSIS — R51 Headache: Secondary | ICD-10-CM | POA: Diagnosis not present

## 2014-11-15 DIAGNOSIS — Z79891 Long term (current) use of opiate analgesic: Secondary | ICD-10-CM | POA: Diagnosis not present

## 2014-11-19 DIAGNOSIS — M4312 Spondylolisthesis, cervical region: Secondary | ICD-10-CM | POA: Diagnosis not present

## 2014-11-19 DIAGNOSIS — M953 Acquired deformity of neck: Secondary | ICD-10-CM | POA: Diagnosis not present

## 2014-11-19 DIAGNOSIS — M47812 Spondylosis without myelopathy or radiculopathy, cervical region: Secondary | ICD-10-CM | POA: Diagnosis not present

## 2014-12-03 DIAGNOSIS — Z23 Encounter for immunization: Secondary | ICD-10-CM | POA: Diagnosis not present

## 2014-12-27 DIAGNOSIS — M542 Cervicalgia: Secondary | ICD-10-CM | POA: Diagnosis not present

## 2014-12-27 DIAGNOSIS — R6884 Jaw pain: Secondary | ICD-10-CM | POA: Diagnosis not present

## 2014-12-27 DIAGNOSIS — G894 Chronic pain syndrome: Secondary | ICD-10-CM | POA: Diagnosis not present

## 2014-12-27 DIAGNOSIS — M792 Neuralgia and neuritis, unspecified: Secondary | ICD-10-CM | POA: Diagnosis not present

## 2014-12-27 DIAGNOSIS — Z79899 Other long term (current) drug therapy: Secondary | ICD-10-CM | POA: Diagnosis not present

## 2015-02-03 DIAGNOSIS — G894 Chronic pain syndrome: Secondary | ICD-10-CM | POA: Diagnosis not present

## 2015-02-03 DIAGNOSIS — R6884 Jaw pain: Secondary | ICD-10-CM | POA: Diagnosis not present

## 2015-02-03 DIAGNOSIS — Z79899 Other long term (current) drug therapy: Secondary | ICD-10-CM | POA: Diagnosis not present

## 2015-02-03 DIAGNOSIS — M542 Cervicalgia: Secondary | ICD-10-CM | POA: Diagnosis not present

## 2015-02-07 DIAGNOSIS — E538 Deficiency of other specified B group vitamins: Secondary | ICD-10-CM | POA: Diagnosis not present

## 2015-02-07 DIAGNOSIS — E039 Hypothyroidism, unspecified: Secondary | ICD-10-CM | POA: Diagnosis not present

## 2015-02-07 DIAGNOSIS — M810 Age-related osteoporosis without current pathological fracture: Secondary | ICD-10-CM | POA: Diagnosis not present

## 2015-02-07 DIAGNOSIS — I10 Essential (primary) hypertension: Secondary | ICD-10-CM | POA: Diagnosis not present

## 2015-02-07 DIAGNOSIS — I5032 Chronic diastolic (congestive) heart failure: Secondary | ICD-10-CM | POA: Diagnosis not present

## 2015-02-14 DIAGNOSIS — Z0001 Encounter for general adult medical examination with abnormal findings: Secondary | ICD-10-CM | POA: Diagnosis not present

## 2015-02-14 DIAGNOSIS — N182 Chronic kidney disease, stage 2 (mild): Secondary | ICD-10-CM | POA: Diagnosis not present

## 2015-02-14 DIAGNOSIS — M810 Age-related osteoporosis without current pathological fracture: Secondary | ICD-10-CM | POA: Diagnosis not present

## 2015-02-14 DIAGNOSIS — Z23 Encounter for immunization: Secondary | ICD-10-CM | POA: Diagnosis not present

## 2015-02-14 DIAGNOSIS — E441 Mild protein-calorie malnutrition: Secondary | ICD-10-CM | POA: Diagnosis not present

## 2015-02-14 DIAGNOSIS — Z1389 Encounter for screening for other disorder: Secondary | ICD-10-CM | POA: Diagnosis not present

## 2015-02-14 DIAGNOSIS — I5032 Chronic diastolic (congestive) heart failure: Secondary | ICD-10-CM | POA: Diagnosis not present

## 2015-02-14 DIAGNOSIS — E039 Hypothyroidism, unspecified: Secondary | ICD-10-CM | POA: Diagnosis not present

## 2015-02-14 DIAGNOSIS — I129 Hypertensive chronic kidney disease with stage 1 through stage 4 chronic kidney disease, or unspecified chronic kidney disease: Secondary | ICD-10-CM | POA: Diagnosis not present

## 2015-03-04 DIAGNOSIS — M542 Cervicalgia: Secondary | ICD-10-CM | POA: Diagnosis not present

## 2015-03-04 DIAGNOSIS — R6884 Jaw pain: Secondary | ICD-10-CM | POA: Diagnosis not present

## 2015-03-04 DIAGNOSIS — Z79899 Other long term (current) drug therapy: Secondary | ICD-10-CM | POA: Diagnosis not present

## 2015-03-04 DIAGNOSIS — G894 Chronic pain syndrome: Secondary | ICD-10-CM | POA: Diagnosis not present

## 2015-04-01 DIAGNOSIS — M2669 Other specified disorders of temporomandibular joint: Secondary | ICD-10-CM | POA: Diagnosis not present

## 2015-04-01 DIAGNOSIS — F4542 Pain disorder with related psychological factors: Secondary | ICD-10-CM | POA: Diagnosis not present

## 2015-04-01 DIAGNOSIS — G894 Chronic pain syndrome: Secondary | ICD-10-CM | POA: Diagnosis not present

## 2015-04-01 DIAGNOSIS — M542 Cervicalgia: Secondary | ICD-10-CM | POA: Diagnosis not present

## 2015-04-01 DIAGNOSIS — Z79899 Other long term (current) drug therapy: Secondary | ICD-10-CM | POA: Diagnosis not present

## 2015-04-07 DIAGNOSIS — Z79899 Other long term (current) drug therapy: Secondary | ICD-10-CM | POA: Diagnosis not present

## 2015-04-07 DIAGNOSIS — M792 Neuralgia and neuritis, unspecified: Secondary | ICD-10-CM | POA: Diagnosis not present

## 2015-04-07 DIAGNOSIS — Z79891 Long term (current) use of opiate analgesic: Secondary | ICD-10-CM | POA: Diagnosis not present

## 2015-04-07 DIAGNOSIS — F4542 Pain disorder with related psychological factors: Secondary | ICD-10-CM | POA: Diagnosis not present

## 2015-04-07 DIAGNOSIS — G894 Chronic pain syndrome: Secondary | ICD-10-CM | POA: Diagnosis not present

## 2015-04-07 DIAGNOSIS — R52 Pain, unspecified: Secondary | ICD-10-CM | POA: Diagnosis not present

## 2015-04-11 DIAGNOSIS — C44722 Squamous cell carcinoma of skin of right lower limb, including hip: Secondary | ICD-10-CM | POA: Diagnosis not present

## 2015-04-11 DIAGNOSIS — C44729 Squamous cell carcinoma of skin of left lower limb, including hip: Secondary | ICD-10-CM | POA: Diagnosis not present

## 2015-04-11 DIAGNOSIS — Z85828 Personal history of other malignant neoplasm of skin: Secondary | ICD-10-CM | POA: Diagnosis not present

## 2015-04-23 DIAGNOSIS — Z85828 Personal history of other malignant neoplasm of skin: Secondary | ICD-10-CM | POA: Diagnosis not present

## 2015-04-23 DIAGNOSIS — C44722 Squamous cell carcinoma of skin of right lower limb, including hip: Secondary | ICD-10-CM | POA: Diagnosis not present

## 2015-04-29 DIAGNOSIS — F4542 Pain disorder with related psychological factors: Secondary | ICD-10-CM | POA: Diagnosis not present

## 2015-04-29 DIAGNOSIS — Z79891 Long term (current) use of opiate analgesic: Secondary | ICD-10-CM | POA: Diagnosis not present

## 2015-04-29 DIAGNOSIS — M2669 Other specified disorders of temporomandibular joint: Secondary | ICD-10-CM | POA: Diagnosis not present

## 2015-04-29 DIAGNOSIS — G894 Chronic pain syndrome: Secondary | ICD-10-CM | POA: Diagnosis not present

## 2015-04-29 DIAGNOSIS — M542 Cervicalgia: Secondary | ICD-10-CM | POA: Diagnosis not present

## 2015-04-29 DIAGNOSIS — Z79899 Other long term (current) drug therapy: Secondary | ICD-10-CM | POA: Diagnosis not present

## 2015-05-05 DIAGNOSIS — M47812 Spondylosis without myelopathy or radiculopathy, cervical region: Secondary | ICD-10-CM | POA: Diagnosis not present

## 2015-05-27 DIAGNOSIS — G5622 Lesion of ulnar nerve, left upper limb: Secondary | ICD-10-CM | POA: Diagnosis not present

## 2015-05-27 DIAGNOSIS — G5602 Carpal tunnel syndrome, left upper limb: Secondary | ICD-10-CM | POA: Diagnosis not present

## 2015-05-27 DIAGNOSIS — G5601 Carpal tunnel syndrome, right upper limb: Secondary | ICD-10-CM | POA: Diagnosis not present

## 2015-05-28 DIAGNOSIS — C44612 Basal cell carcinoma of skin of right upper limb, including shoulder: Secondary | ICD-10-CM | POA: Diagnosis not present

## 2015-05-28 DIAGNOSIS — L814 Other melanin hyperpigmentation: Secondary | ICD-10-CM | POA: Diagnosis not present

## 2015-05-28 DIAGNOSIS — D225 Melanocytic nevi of trunk: Secondary | ICD-10-CM | POA: Diagnosis not present

## 2015-05-28 DIAGNOSIS — L821 Other seborrheic keratosis: Secondary | ICD-10-CM | POA: Diagnosis not present

## 2015-05-28 DIAGNOSIS — D2272 Melanocytic nevi of left lower limb, including hip: Secondary | ICD-10-CM | POA: Diagnosis not present

## 2015-05-28 DIAGNOSIS — Z85828 Personal history of other malignant neoplasm of skin: Secondary | ICD-10-CM | POA: Diagnosis not present

## 2015-05-28 DIAGNOSIS — D1801 Hemangioma of skin and subcutaneous tissue: Secondary | ICD-10-CM | POA: Diagnosis not present

## 2015-05-28 DIAGNOSIS — C44319 Basal cell carcinoma of skin of other parts of face: Secondary | ICD-10-CM | POA: Diagnosis not present

## 2015-05-28 DIAGNOSIS — D2262 Melanocytic nevi of left upper limb, including shoulder: Secondary | ICD-10-CM | POA: Diagnosis not present

## 2015-05-28 DIAGNOSIS — C44519 Basal cell carcinoma of skin of other part of trunk: Secondary | ICD-10-CM | POA: Diagnosis not present

## 2015-05-28 DIAGNOSIS — D485 Neoplasm of uncertain behavior of skin: Secondary | ICD-10-CM | POA: Diagnosis not present

## 2015-05-28 DIAGNOSIS — L570 Actinic keratosis: Secondary | ICD-10-CM | POA: Diagnosis not present

## 2015-05-29 DIAGNOSIS — M2669 Other specified disorders of temporomandibular joint: Secondary | ICD-10-CM | POA: Diagnosis not present

## 2015-05-29 DIAGNOSIS — F4542 Pain disorder with related psychological factors: Secondary | ICD-10-CM | POA: Diagnosis not present

## 2015-05-29 DIAGNOSIS — M542 Cervicalgia: Secondary | ICD-10-CM | POA: Diagnosis not present

## 2015-05-29 DIAGNOSIS — Z79891 Long term (current) use of opiate analgesic: Secondary | ICD-10-CM | POA: Diagnosis not present

## 2015-05-29 DIAGNOSIS — Z79899 Other long term (current) drug therapy: Secondary | ICD-10-CM | POA: Diagnosis not present

## 2015-05-29 DIAGNOSIS — G894 Chronic pain syndrome: Secondary | ICD-10-CM | POA: Diagnosis not present

## 2015-06-03 DIAGNOSIS — N6489 Other specified disorders of breast: Secondary | ICD-10-CM | POA: Diagnosis not present

## 2015-06-03 DIAGNOSIS — R928 Other abnormal and inconclusive findings on diagnostic imaging of breast: Secondary | ICD-10-CM | POA: Diagnosis not present

## 2015-06-10 DIAGNOSIS — L089 Local infection of the skin and subcutaneous tissue, unspecified: Secondary | ICD-10-CM | POA: Diagnosis not present

## 2015-06-10 DIAGNOSIS — Z85828 Personal history of other malignant neoplasm of skin: Secondary | ICD-10-CM | POA: Diagnosis not present

## 2015-06-10 DIAGNOSIS — D485 Neoplasm of uncertain behavior of skin: Secondary | ICD-10-CM | POA: Diagnosis not present

## 2015-06-24 DIAGNOSIS — L570 Actinic keratosis: Secondary | ICD-10-CM | POA: Diagnosis not present

## 2015-06-24 DIAGNOSIS — C44729 Squamous cell carcinoma of skin of left lower limb, including hip: Secondary | ICD-10-CM | POA: Diagnosis not present

## 2015-06-24 DIAGNOSIS — Z85828 Personal history of other malignant neoplasm of skin: Secondary | ICD-10-CM | POA: Diagnosis not present

## 2015-06-30 DIAGNOSIS — G894 Chronic pain syndrome: Secondary | ICD-10-CM | POA: Diagnosis not present

## 2015-06-30 DIAGNOSIS — M5030 Other cervical disc degeneration, unspecified cervical region: Secondary | ICD-10-CM | POA: Diagnosis not present

## 2015-06-30 DIAGNOSIS — Z79891 Long term (current) use of opiate analgesic: Secondary | ICD-10-CM | POA: Diagnosis not present

## 2015-06-30 DIAGNOSIS — M4692 Unspecified inflammatory spondylopathy, cervical region: Secondary | ICD-10-CM | POA: Diagnosis not present

## 2015-06-30 DIAGNOSIS — M2669 Other specified disorders of temporomandibular joint: Secondary | ICD-10-CM | POA: Diagnosis not present

## 2015-06-30 DIAGNOSIS — Z79899 Other long term (current) drug therapy: Secondary | ICD-10-CM | POA: Diagnosis not present

## 2015-07-07 DIAGNOSIS — C44319 Basal cell carcinoma of skin of other parts of face: Secondary | ICD-10-CM | POA: Diagnosis not present

## 2015-07-07 DIAGNOSIS — Z85828 Personal history of other malignant neoplasm of skin: Secondary | ICD-10-CM | POA: Diagnosis not present

## 2015-07-29 DIAGNOSIS — L814 Other melanin hyperpigmentation: Secondary | ICD-10-CM | POA: Diagnosis not present

## 2015-07-29 DIAGNOSIS — D1801 Hemangioma of skin and subcutaneous tissue: Secondary | ICD-10-CM | POA: Diagnosis not present

## 2015-07-29 DIAGNOSIS — L821 Other seborrheic keratosis: Secondary | ICD-10-CM | POA: Diagnosis not present

## 2015-07-29 DIAGNOSIS — L570 Actinic keratosis: Secondary | ICD-10-CM | POA: Diagnosis not present

## 2015-07-29 DIAGNOSIS — D485 Neoplasm of uncertain behavior of skin: Secondary | ICD-10-CM | POA: Diagnosis not present

## 2015-07-29 DIAGNOSIS — Z79899 Other long term (current) drug therapy: Secondary | ICD-10-CM | POA: Diagnosis not present

## 2015-07-29 DIAGNOSIS — D225 Melanocytic nevi of trunk: Secondary | ICD-10-CM | POA: Diagnosis not present

## 2015-07-29 DIAGNOSIS — Z85828 Personal history of other malignant neoplasm of skin: Secondary | ICD-10-CM | POA: Diagnosis not present

## 2015-07-31 DIAGNOSIS — Z79899 Other long term (current) drug therapy: Secondary | ICD-10-CM | POA: Diagnosis not present

## 2015-08-07 DIAGNOSIS — E039 Hypothyroidism, unspecified: Secondary | ICD-10-CM | POA: Diagnosis not present

## 2015-08-07 DIAGNOSIS — E559 Vitamin D deficiency, unspecified: Secondary | ICD-10-CM | POA: Diagnosis not present

## 2015-08-07 DIAGNOSIS — E538 Deficiency of other specified B group vitamins: Secondary | ICD-10-CM | POA: Diagnosis not present

## 2015-08-07 DIAGNOSIS — I129 Hypertensive chronic kidney disease with stage 1 through stage 4 chronic kidney disease, or unspecified chronic kidney disease: Secondary | ICD-10-CM | POA: Diagnosis not present

## 2015-08-07 DIAGNOSIS — I10 Essential (primary) hypertension: Secondary | ICD-10-CM | POA: Diagnosis not present

## 2015-08-12 DIAGNOSIS — Z79899 Other long term (current) drug therapy: Secondary | ICD-10-CM | POA: Diagnosis not present

## 2015-08-12 DIAGNOSIS — R6884 Jaw pain: Secondary | ICD-10-CM | POA: Diagnosis not present

## 2015-08-12 DIAGNOSIS — G894 Chronic pain syndrome: Secondary | ICD-10-CM | POA: Diagnosis not present

## 2015-08-12 DIAGNOSIS — Z79891 Long term (current) use of opiate analgesic: Secondary | ICD-10-CM | POA: Diagnosis not present

## 2015-08-14 DIAGNOSIS — I129 Hypertensive chronic kidney disease with stage 1 through stage 4 chronic kidney disease, or unspecified chronic kidney disease: Secondary | ICD-10-CM | POA: Diagnosis not present

## 2015-08-14 DIAGNOSIS — I5032 Chronic diastolic (congestive) heart failure: Secondary | ICD-10-CM | POA: Diagnosis not present

## 2015-08-14 DIAGNOSIS — E441 Mild protein-calorie malnutrition: Secondary | ICD-10-CM | POA: Diagnosis not present

## 2015-08-14 DIAGNOSIS — E039 Hypothyroidism, unspecified: Secondary | ICD-10-CM | POA: Diagnosis not present

## 2015-09-02 DIAGNOSIS — Z79899 Other long term (current) drug therapy: Secondary | ICD-10-CM | POA: Diagnosis not present

## 2015-09-08 ENCOUNTER — Other Ambulatory Visit: Payer: Self-pay

## 2015-09-08 DIAGNOSIS — H04123 Dry eye syndrome of bilateral lacrimal glands: Secondary | ICD-10-CM | POA: Diagnosis not present

## 2015-09-08 DIAGNOSIS — Z0100 Encounter for examination of eyes and vision without abnormal findings: Secondary | ICD-10-CM | POA: Diagnosis not present

## 2015-09-24 DIAGNOSIS — M545 Low back pain: Secondary | ICD-10-CM | POA: Diagnosis not present

## 2015-09-24 DIAGNOSIS — M542 Cervicalgia: Secondary | ICD-10-CM | POA: Diagnosis not present

## 2015-09-26 ENCOUNTER — Other Ambulatory Visit: Payer: Self-pay | Admitting: Orthopaedic Surgery

## 2015-09-26 DIAGNOSIS — M542 Cervicalgia: Secondary | ICD-10-CM

## 2015-10-01 DIAGNOSIS — D2339 Other benign neoplasm of skin of other parts of face: Secondary | ICD-10-CM | POA: Diagnosis not present

## 2015-10-01 DIAGNOSIS — D2271 Melanocytic nevi of right lower limb, including hip: Secondary | ICD-10-CM | POA: Diagnosis not present

## 2015-10-01 DIAGNOSIS — R21 Rash and other nonspecific skin eruption: Secondary | ICD-10-CM | POA: Diagnosis not present

## 2015-10-01 DIAGNOSIS — D485 Neoplasm of uncertain behavior of skin: Secondary | ICD-10-CM | POA: Diagnosis not present

## 2015-10-01 DIAGNOSIS — D2262 Melanocytic nevi of left upper limb, including shoulder: Secondary | ICD-10-CM | POA: Diagnosis not present

## 2015-10-01 DIAGNOSIS — Z85828 Personal history of other malignant neoplasm of skin: Secondary | ICD-10-CM | POA: Diagnosis not present

## 2015-10-01 DIAGNOSIS — L570 Actinic keratosis: Secondary | ICD-10-CM | POA: Diagnosis not present

## 2015-10-01 DIAGNOSIS — D2272 Melanocytic nevi of left lower limb, including hip: Secondary | ICD-10-CM | POA: Diagnosis not present

## 2015-10-01 DIAGNOSIS — D1801 Hemangioma of skin and subcutaneous tissue: Secondary | ICD-10-CM | POA: Diagnosis not present

## 2015-10-01 DIAGNOSIS — D225 Melanocytic nevi of trunk: Secondary | ICD-10-CM | POA: Diagnosis not present

## 2015-10-05 ENCOUNTER — Ambulatory Visit
Admission: RE | Admit: 2015-10-05 | Discharge: 2015-10-05 | Disposition: A | Discharge status: Home / Self Care | Payer: BLUE CROSS/BLUE SHIELD | Source: Ambulatory Visit | Attending: Orthopaedic Surgery | Admitting: Orthopaedic Surgery

## 2015-10-05 DIAGNOSIS — M542 Cervicalgia: Secondary | ICD-10-CM

## 2015-10-05 DIAGNOSIS — M4802 Spinal stenosis, cervical region: Secondary | ICD-10-CM | POA: Diagnosis not present

## 2015-10-13 ENCOUNTER — Ambulatory Visit (INDEPENDENT_AMBULATORY_CARE_PROVIDER_SITE_OTHER): Payer: Self-pay | Admitting: Orthopaedic Surgery

## 2015-10-29 ENCOUNTER — Ambulatory Visit (INDEPENDENT_AMBULATORY_CARE_PROVIDER_SITE_OTHER): Payer: Medicare Other | Admitting: Orthopaedic Surgery

## 2015-10-29 DIAGNOSIS — M542 Cervicalgia: Secondary | ICD-10-CM | POA: Diagnosis not present

## 2015-12-29 DIAGNOSIS — Z23 Encounter for immunization: Secondary | ICD-10-CM | POA: Diagnosis not present

## 2015-12-29 DIAGNOSIS — I10 Essential (primary) hypertension: Secondary | ICD-10-CM | POA: Diagnosis not present

## 2015-12-29 DIAGNOSIS — E039 Hypothyroidism, unspecified: Secondary | ICD-10-CM | POA: Diagnosis not present

## 2015-12-29 DIAGNOSIS — I872 Venous insufficiency (chronic) (peripheral): Secondary | ICD-10-CM | POA: Diagnosis not present

## 2015-12-29 DIAGNOSIS — E559 Vitamin D deficiency, unspecified: Secondary | ICD-10-CM | POA: Diagnosis not present

## 2015-12-29 DIAGNOSIS — L659 Nonscarring hair loss, unspecified: Secondary | ICD-10-CM | POA: Diagnosis not present

## 2015-12-31 DIAGNOSIS — L821 Other seborrheic keratosis: Secondary | ICD-10-CM | POA: Diagnosis not present

## 2015-12-31 DIAGNOSIS — L814 Other melanin hyperpigmentation: Secondary | ICD-10-CM | POA: Diagnosis not present

## 2015-12-31 DIAGNOSIS — L858 Other specified epidermal thickening: Secondary | ICD-10-CM | POA: Diagnosis not present

## 2015-12-31 DIAGNOSIS — D2261 Melanocytic nevi of right upper limb, including shoulder: Secondary | ICD-10-CM | POA: Diagnosis not present

## 2015-12-31 DIAGNOSIS — L570 Actinic keratosis: Secondary | ICD-10-CM | POA: Diagnosis not present

## 2015-12-31 DIAGNOSIS — D2262 Melanocytic nevi of left upper limb, including shoulder: Secondary | ICD-10-CM | POA: Diagnosis not present

## 2015-12-31 DIAGNOSIS — Z85828 Personal history of other malignant neoplasm of skin: Secondary | ICD-10-CM | POA: Diagnosis not present

## 2015-12-31 DIAGNOSIS — C44519 Basal cell carcinoma of skin of other part of trunk: Secondary | ICD-10-CM | POA: Diagnosis not present

## 2015-12-31 DIAGNOSIS — D225 Melanocytic nevi of trunk: Secondary | ICD-10-CM | POA: Diagnosis not present

## 2015-12-31 DIAGNOSIS — C44619 Basal cell carcinoma of skin of left upper limb, including shoulder: Secondary | ICD-10-CM | POA: Diagnosis not present

## 2015-12-31 DIAGNOSIS — C44722 Squamous cell carcinoma of skin of right lower limb, including hip: Secondary | ICD-10-CM | POA: Diagnosis not present

## 2015-12-31 DIAGNOSIS — L820 Inflamed seborrheic keratosis: Secondary | ICD-10-CM | POA: Diagnosis not present

## 2016-01-09 DIAGNOSIS — E039 Hypothyroidism, unspecified: Secondary | ICD-10-CM | POA: Diagnosis not present

## 2016-01-09 DIAGNOSIS — R51 Headache: Secondary | ICD-10-CM | POA: Diagnosis not present

## 2016-01-09 DIAGNOSIS — R5383 Other fatigue: Secondary | ICD-10-CM | POA: Diagnosis not present

## 2016-01-19 ENCOUNTER — Encounter: Payer: Self-pay | Admitting: Neurology

## 2016-01-19 ENCOUNTER — Ambulatory Visit (INDEPENDENT_AMBULATORY_CARE_PROVIDER_SITE_OTHER): Payer: Medicare Other | Admitting: Neurology

## 2016-01-19 DIAGNOSIS — G4710 Hypersomnia, unspecified: Secondary | ICD-10-CM | POA: Diagnosis not present

## 2016-01-19 DIAGNOSIS — R51 Headache: Secondary | ICD-10-CM | POA: Diagnosis not present

## 2016-01-19 DIAGNOSIS — R519 Headache, unspecified: Secondary | ICD-10-CM

## 2016-01-19 MED ORDER — BUTALBITAL-APAP-CAFF-COD 50-300-40-30 MG PO CAPS: ORAL_CAPSULE | ORAL | 5 refills | Status: DC
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
## 2016-01-20 LAB — C-REACTIVE PROTEIN: CRP: 1.9 mg/L (ref 0.0–4.9)

## 2016-01-20 LAB — SEDIMENTATION RATE: Sed Rate: 2 mm/hr (ref 0–40)

## 2016-01-24 ENCOUNTER — Ambulatory Visit
Admission: RE | Admit: 2016-01-24 | Discharge: 2016-01-24 | Disposition: A | Discharge status: Home / Self Care | Payer: Medicare Other | Source: Ambulatory Visit | Attending: Neurology | Admitting: Neurology

## 2016-01-24 DIAGNOSIS — R51 Headache: Principal | ICD-10-CM

## 2016-01-24 DIAGNOSIS — G4710 Hypersomnia, unspecified: Secondary | ICD-10-CM

## 2016-01-24 DIAGNOSIS — R519 Headache, unspecified: Secondary | ICD-10-CM

## 2016-01-24 MED ORDER — GADOBENATE DIMEGLUMINE 529 MG/ML IV SOLN
10.0000 mL | Freq: Once | INTRAVENOUS | Status: AC | PRN
  Administered 2016-01-24: 10 mL via INTRAVENOUS

## 2016-01-26 ENCOUNTER — Telehealth: Payer: Self-pay | Admitting: Neurology

## 2016-01-26 NOTE — Telephone Encounter (Signed)
Left message for a return call

## 2016-01-26 NOTE — Telephone Encounter (Signed)
Please call patient, MRI of the brain showed evidence of chronic small vessel disease, no acute abnormalities   IMPRESSION:    This MRI of the brain with and without contrast shows the following: 1.   There are scattered T2/FLAIR hyperintense foci in the subcortical and deep white matter of both hemispheres. This is a nonspecific pattern and most likely represents mild chronic microvascular ischemic change. Demyelination or vasculitis would be less likely to give this pattern. 2.    There were no acute findings and there is a normal enhancement pattern.

## 2016-01-27 NOTE — Telephone Encounter (Signed)
Left message for a return call

## 2016-01-27 NOTE — Telephone Encounter (Signed)
Spoke to patient - she is aware of results and will keep her pending appointments.

## 2016-01-27 NOTE — Telephone Encounter (Signed)
Patient returning nurse's call.  Please call

## 2016-02-10 DIAGNOSIS — Z85828 Personal history of other malignant neoplasm of skin: Secondary | ICD-10-CM | POA: Diagnosis not present

## 2016-02-11 ENCOUNTER — Ambulatory Visit (INDEPENDENT_AMBULATORY_CARE_PROVIDER_SITE_OTHER): Payer: Medicare Other | Admitting: Neurology

## 2016-02-11 DIAGNOSIS — R4182 Altered mental status, unspecified: Secondary | ICD-10-CM

## 2016-02-11 DIAGNOSIS — R519 Headache, unspecified: Secondary | ICD-10-CM

## 2016-02-11 DIAGNOSIS — R51 Headache: Secondary | ICD-10-CM

## 2016-02-11 DIAGNOSIS — G4710 Hypersomnia, unspecified: Secondary | ICD-10-CM

## 2016-02-11 NOTE — Procedures (Signed)
    History:  Angela [name] is a 71 year old patient with a history of recent onset of excessive daytime drowsiness since the summer 2017. The patient has intrusion of vivid dreams at times. The patient is being evaluated for these events.  This is a routine EEG. No skull defects are noted. Medications include Armour Thyroid, aspirin, Subutex, hydrochlorothiazide, Lasix, and potassium supplementation.   EEG classification: Normal awake  Description of the recording: The background rhythms of this recording consists of a fairly well modulated medium amplitude alpha rhythm of 10 Hz that is reactive to eye opening and closure. As the record progresses, the patient appears to remain in the waking state throughout the recording. Photic stimulation was performed, resulting in a bilateral and symmetric photic driving response. Hyperventilation was also performed, resulting in a minimal buildup of the background rhythm activities without significant slowing seen. At no time during the recording does there appear to be evidence of spike or spike wave discharges or evidence of focal slowing. EKG monitor shows no evidence of cardiac rhythm abnormalities with a heart rate of 72.  Impression: This is a normal EEG recording in the waking state. No evidence of ictal or interictal discharges are seen.

## 2016-02-12 ENCOUNTER — Encounter: Payer: Self-pay | Admitting: Neurology

## 2016-02-12 ENCOUNTER — Telehealth: Payer: Self-pay | Admitting: *Deleted

## 2016-02-12 ENCOUNTER — Ambulatory Visit (INDEPENDENT_AMBULATORY_CARE_PROVIDER_SITE_OTHER): Payer: Medicare Other | Admitting: Neurology

## 2016-02-12 VITALS — BP 108/70 | HR 77 | Resp 20 | Ht 65.5 in | Wt 119.0 lb

## 2016-02-12 DIAGNOSIS — G4753 Recurrent isolated sleep paralysis: Secondary | ICD-10-CM

## 2016-02-12 DIAGNOSIS — G4719 Other hypersomnia: Secondary | ICD-10-CM

## 2016-02-12 DIAGNOSIS — R442 Other hallucinations: Secondary | ICD-10-CM | POA: Diagnosis not present

## 2016-02-12 NOTE — Progress Notes (Signed)
[facility]   Provider:  Larey Seat, M D  Referring Provider: Hilbert Bible Primary Care Physician:  Michel Harrow, PA-C  Chief Complaint  Patient presents with  . New Patient (Initial Visit)    fatigue, had a sleep study years ago    HPI:  Angela [name] is a 71 y.o. female , seen here as a referral from Dr. Krista Blue, for evaluation of sudden intruding day dreams, and sleep attacks, fatigue.    Dear Aliene Beams,  Thank you for entrusting your patient , Angela [name], to my care.   as you know, her chief complaint according to patient is her  voiced concern about driving safety and that she feels insecure about the intruding daydreams, hallucinations.  Angela [name] had seen my colleague Dr. Krista Blue with her primary concern of headaches, but during the conversation with her mentioned that since the summer of 2017, she had noticed episodic visit dreams by reading even by carrying on a conversation with another person, and that these were very intrusive into her thought process. At the same time she developed sleep attacks, excessive daytime sleepiness that was hard to control. She reports that she often leaves the window of her car crack open to have some additional stimulus keeping her from falling asleep, she will drink very cold water, chewing gum, eating sour candies or lemon- all with the intent of keeping her from getting too drowsy. 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 She reports that if the office is not busy and she is not stimulated, she is struggling to stay alert and awake. She needs to keep physically active to not get drowsy. Exercise keeps her going, pacing the room, making a trip to the water cooler.  Her past medical history is recalled in short- multiple TMJ surgeries, hypothyroidism, HTN.  Sleep habits are as follows: At  bedtime, which varies between 9 PM and 11 PM, she usually only retreats to a cool, quiet and dark bedroom. She likes to sleep flat, always on her back. She does not know if she snores or not and lives alone. She is aware that she is a mouth breather as her mouth is very dry when she wakes. She may have one bathroom break at night, usually between 2 and 4 AM. When she wakes up she feels miserable not restored, not refreshed and this has not been the case 2 years ago. Over the last several months she has been waking up feeling still fatigued and exhausted. She estimates her overall nocturnal sleep time to be 5-6 hours. She rises in the morning at 5 AM , is spontaneously awake by the time. She does not remember dreaming at night. she has experienced sleep paralysis. She craves power naps , but has not often opportunity to sleep, and sometimes find herself unable to nap. When very sleepy, the daydreams intrude - hypnagogic/ hypnopompic hallucinations?  "Crazy , vivid - off the wall dreams ".   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   Review of Systems: Out of a complete 14 system review, the patient complains of only the following symptoms, and all other reviewed systems are negative. How likely are you to doze in the following situations: 0 = not likely, 1 = slight chance, 2 = moderate chance, 3 = high chance  Sitting and Reading?3 Watching Television?3 Sitting inactive in a public place (theater or meeting)?3 As a passenger in a car for an hour without a break?3  Lying down in the afternoon when circumstances permit?3 Sitting and talking to someone?2 Sitting quietly after lunch without alcohol?2 In a car, while stopped for a few minutes in traffic?0  Total = 19  Epworth score 19 , Fatigue severity score 47  , depression score 1/15    Social History   Social History  . Marital status: Single    Spouse name: N/A  . Number of  children: 1  . Years of education: Bachelors   Occupational History  . Part-time Investment banker, corporate    Social History Main Topics  . Smoking status: Former Games developer  . Smokeless tobacco: Never Used     Comment: Quit January 1980  . Alcohol use Yes     Comment: occasional  . Drug use: No  . Sexual activity: Not on file   Other Topics Concern  . Not on file   Social History Narrative   Lives at home alone.   Right-handed.   2 cups caffeine per day.    Family History  Problem Relation Age of Onset  . Breast cancer Mother   . Rheum arthritis Mother   . Aneurysm Father   . Heart disease Father   . Throat cancer Father   . Melanoma Father   . Heart disease Brother   . Heart disease Brother   . Thyroid disease Brother   . Post-traumatic stress disorder Brother   . Rheum arthritis Brother   . Polymyositis Brother   . Obesity Brother     Past Medical History:  Diagnosis Date  . Chronic pain   . Headache   . Hypertension   . Joint disorder     Past Surgical History:  Procedure Laterality Date  . ABDOMINAL HYSTERECTOMY     Age 62  . ANKLE SURGERY     x3  . DILATION AND CURETTAGE OF UTERUS     x 4  . KNEE SURGERY     x 3  . MANDIBLE FRACTURE SURGERY     x 19    Current Outpatient Prescriptions  Medication Sig Dispense Refill  . ARMOUR THYROID PO Take 45 mcg by mouth.    . buprenorphine (SUBUTEX) 8 MG SUBL Place 4 mg under the tongue 5 (five) times daily.    . Butalbital-APAP-Caff-Cod 50-300-40-30 MG CAPS As needed for headaches 15 capsule 5  . furosemide (LASIX) 20 MG tablet daily as needed.  0  . lisinopril-hydrochlorothiazide (PRINZIDE,ZESTORETIC) 10-12.5 MG per tablet Take 1 tablet by mouth daily. 30 tablet 0  . potassium chloride (K-DUR) 10 MEQ tablet daily.  4  . predniSONE (DELTASONE) 20 MG tablet   0   No current facility-administered medications for this visit.     Allergies as of 02/12/2016 - Review Complete 02/12/2016  Allergen Reaction Noted  .  Doxycycline Nausea And Vomiting and Other (See Comments) 10/21/2011  . Nsaids Other (See Comments) 10/21/2011    Vitals: BP 108/70   Pulse 77   Resp 20   Ht 5' 5.5" (1.664 m)   Wt 119 lb (54 kg)   BMI 19.50 kg/m  Last Weight:  Wt Readings from Last 1 Encounters:  02/12/16 119 lb (54 kg)   ATN:EIAR mass index is 19.5 kg/m.     Last Height:   Ht Readings from Last 1 Encounters:  02/12/16 5' 5.5" (1.664 m)  Physical exam:  General: The patient is awake, alert and appears not in acute distress. The patient is well groomed. Head: Normocephalic, atraumatic. Neck is supple. Mallampati 2-3 neck circumference: 13.5 . Nasal airflow patent , TMJ is evident . Retrognathia is not seen.  Cardiovascular:  Regular rate and rhythm, without  murmurs or carotid bruit, and without distended neck veins. Respiratory: Lungs are clear to auscultation. Skin:   evidence of leg- ankle edema, no rash Trunk: BMI is 20  Neurologic exam : The patient is awake and alert, oriented to place and time.    Attention span & concentration ability appears normal.  Speech is fluent,  without dysarthria, but hoarse- dysphonia.  Mood and affect are appropriate.  Cranial nerves: Pupils are equal and briskly reactive to light. Funduscopic exam without  evidence of pallor or edema. Extraocular movements  in vertical and horizontal planes intact and without nystagmus. Visual fields by finger perimetry are intact.Hearing to finger rub intact.  Facial sensation intact to fine touch. Facial motor strength is symmetric and tongue and uvula move midline. Shoulder shrug was symmetrical.   Motor exam: Normal tone, muscle bulk and symmetric strength in all extremities.  Sensory:  Fine touch, pinprick and vibration were  normal.  Coordination: Rapid alternating movements in the fingers/hands was normal. Finger-to-nose maneuver  normal without evidence of ataxia, dysmetria or tremor.  Gait and station: Patient walks without  assistive device and is able unassisted to climb up to the exam table. Strength within normal limits.  Stance is stable and normal.  Deep tendon reflexes: in the  upper and lower extremities are symmetric and intact. Babinski maneuver response is downgoing.  The patient was advised of the nature of the diagnosed sleep disorder , the treatment options and risks for general a health and wellness arising from not treating the condition.  I spent more than 40 minutes of face to face time with the patient. Greater than 50% of time was spent in counseling and coordination of care. We have discussed the diagnosis and differential and I answered the patient's questions.     Assessment:  After physical and neurologic examination, review of laboratory studies,  Personal review of imaging studies, reports of other /same  Imaging studies ,  Results of polysomnography/ neurophysiology testing and pre-existing records as far as provided in visit., my assessment is   1) I am not suspicious that Angela [name] will have a form of sleep disordered breathing, but I am highly suspicious that she may have narcolepsy, She only required a tendency to sleep attacks and dream intrusion within the last 2 years or so, but it is progressive. She is not sure that she ever had a cataplectic attack. 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 I will order an HLA narcolepsy test as well as the PSG followed by an M SLT.   Plan:  Treatment plan and additional workup : Rv after MSLT .        Asencion Partridge Samera Macy MD  02/12/2016   CC: Dr Krista Blue, [place], [place], [place] [address]

## 2016-02-12 NOTE — Patient Instructions (Signed)
I believe you may have a condition called narcolepsy: This means, that you have a sleep disorder that manifests with at times severe excessive sleepiness during the day and often with problems with sleep at night. We may have to try different medications that may help you stay awake during the day. Not everything works with everybody the same way. Wake promoting agents include stimulants and non-stimulant type medications. The most common side effects with stimulants are weight loss, insomnia, nervousness, headaches, palpitations, rise in blood pressure, anxiety. Stimulants can be addictive and subject to abuse. Non-stimulant type wake promoting medications include Provigil and Nuvigil, most common side effects include headaches, nervousness, insomnia, hypertension. In addition there is a medication called Xyrem which has been proven to be very effective in patients with narcolepsy with or without cataplexy. Some patients with narcolepsy report episodes of weakness, such as jaw or facial weakness, legs giving out, feeling wobbly or like "Jell-o", etc. in situations of anxiety, stress, laughter, sudden sadness, surprise, etc., which is called cataplexy. You can also experience episodes of sleep paralysis during which you may feel unable to move upon awakening. Some people experience dreamlike sequences upon awakening or upon drifting off to sleep, called hypnopompic or hypnagogic hallucinations.  

## 2016-02-12 NOTE — Telephone Encounter (Signed)
Patient aware of results.

## 2016-02-12 NOTE — Telephone Encounter (Signed)
-----   Message from Kathrynn Ducking, MD sent at 02/11/2016  5:48 PM EST ----- Please call the patient, the EEG study was unremarkable.

## 2016-02-19 ENCOUNTER — Ambulatory Visit (INDEPENDENT_AMBULATORY_CARE_PROVIDER_SITE_OTHER): Payer: Medicare Other | Admitting: Neurology

## 2016-02-19 ENCOUNTER — Encounter: Payer: Self-pay | Admitting: Neurology

## 2016-02-19 VITALS — BP 99/65 | HR 68 | Ht 65.5 in | Wt 120.5 lb

## 2016-02-19 DIAGNOSIS — R519 Headache, unspecified: Secondary | ICD-10-CM

## 2016-02-19 DIAGNOSIS — G4710 Hypersomnia, unspecified: Secondary | ICD-10-CM

## 2016-02-19 DIAGNOSIS — R51 Headache: Secondary | ICD-10-CM | POA: Diagnosis not present
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
## 2016-02-22 ENCOUNTER — Ambulatory Visit (INDEPENDENT_AMBULATORY_CARE_PROVIDER_SITE_OTHER): Payer: Medicare Other | Admitting: Neurology

## 2016-02-22 DIAGNOSIS — G4710 Hypersomnia, unspecified: Secondary | ICD-10-CM | POA: Diagnosis not present

## 2016-02-22 DIAGNOSIS — G4719 Other hypersomnia: Secondary | ICD-10-CM

## 2016-02-22 DIAGNOSIS — G4753 Recurrent isolated sleep paralysis: Secondary | ICD-10-CM

## 2016-02-22 DIAGNOSIS — R442 Other hallucinations: Secondary | ICD-10-CM

## 2016-02-25 ENCOUNTER — Telehealth: Payer: Self-pay

## 2016-02-25 NOTE — Telephone Encounter (Signed)
I spoke to patient and let her know that HLA typing for Narcolepsy came back negative. Patient voiced understanding.

## 2016-02-27 DIAGNOSIS — I129 Hypertensive chronic kidney disease with stage 1 through stage 4 chronic kidney disease, or unspecified chronic kidney disease: Secondary | ICD-10-CM | POA: Diagnosis not present

## 2016-03-02 ENCOUNTER — Telehealth: Payer: Self-pay | Admitting: Neurology

## 2016-03-02 DIAGNOSIS — G4733 Obstructive sleep apnea (adult) (pediatric): Secondary | ICD-10-CM

## 2016-03-02 NOTE — Procedures (Signed)
PATIENT'S NAME:  Shakira, Hegwood DOB:      03/11/1945      MR#:    [id number]     DATE OF RECORDING: 02/22/2016 REFERRING M.D.:  Marcial Pacas, MD Study Performed:   Baseline Polysomnogram HISTORY:  Mrs. Varughese had seen my colleague Dr. Krista Blue with her primary concern of headaches, but during the conversation with her mentioned that she had noticed episodic visit dreams while reading, while in  conversation with another person, and that these were very intrusive into her thought process. At the same time she developed sleep attacks, excessive daytime sleepiness that was hard to control. All begun in the Summer of 2017. She combats her EDS:  She reports she often leaves the window of her car a crack open to have some additional stimulus keeping her from falling asleep, she will drink very cold water, chews gum, eats sour candies or lemon- all with the intent of keeping her from getting too drowsy. She reports that if the office is not busy and she is not stimulated, she is struggling to stay alert and awake. She needs to keep physically active to not get drowsy. Exercise keeps her going, thus she is pacing the office.  She does not remember dreaming at night. She has experienced sleep paralysis. She craves power naps , but has not often opportunity to sleep, and sometimes find herself unable to nap. When very sleepy, the daydreams intrude - hypnagogic/ hypnopompic hallucinations?  "Crazy , vivid - off the wall dreams ".    The patient endorsed the Epworth Sleepiness Scale at 19 points.   The patient's weight 119 pounds with a height of 65.5 (inches), resulting in a BMI of 19.8 kg/m2. The patient's neck circumference measured 13.5 inches.  CURRENT MEDICATIONS: Armour Thyroid, Subutex, Butalbital-APAP, Lasix, Lisinopril-hydrochlorothiazide, K-Dur, Prednisone   PROCEDURE:  This is a multichannel digital polysomnogram utilizing the Somnostar 11.2 system.  Electrodes and sensors were applied and monitored per AASM  Specifications.   EEG, EOG, Chin and Limb EMG, were sampled at 200 Hz.  ECG, Snore and Nasal Pressure, Thermal Airflow, Respiratory Effort, CPAP Flow and Pressure, Oximetry was sampled at 50 Hz. Digital video and audio were recorded.      BASELINE STUDY  Lights Out was at 22:44 and Lights On at 05:00.  Total recording time (TRT) was 376.5 minutes, with a total sleep time (TST) of 323 minutes.   The patient's sleep latency was 3.5 minutes.  REM latency was 142.5 minutes.  The sleep efficiency was 85.8 %.     SLEEP ARCHITECTURE: WASO (Wake after sleep onset) was 49.5 minutes.  There were 27 minutes in Stage N1, 194.5 minutes Stage N2, 49.5 minutes Stage N3 and 52 minutes in Stage REM.  The percentage of Stage N1 was 8.4%, Stage N2 was 60.2%, Stage N3 was 15.3% and Stage R (REM sleep) was 16.1%.   RESPIRATORY ANALYSIS:  There were 94 respiratory events:  32 obstructive apneas, 1 central apnea and 0 mixed apneas with 61 hypopneas. The patient also had 0 respiratory event related arousals (RERAs).      The total APNEA/HYPOPNEA INDEX (AHI) was 17.5/hour and the total RESPIRATORY DISTURBANCE INDEX was 17.5 /hour.  29 events occurred in REM sleep and 119 events in NREM. The REM AHI was 33.5 /hour, versus a non-REM AHI of 14.4. The patient spent 323 minutes of total sleep time in the supine position and 0 minutes in non-supine. The supine AHI was 17.4 versus a non-supine AHI of  0.0.  OXYGEN SATURATION & C02:  The Wake baseline 02 saturation was 96%, with the lowest being 81%. Time spent below 89% saturation equaled 5.0 minutes.   PERIODIC LIMB MOVEMENTS:   The patient had a total of 0 Periodic Limb Movements.   The arousals were noted as: 10 were spontaneous, 0 were associated with PLMs, and 42 were associated with respiratory events. Audio and video analysis did not show any abnormal or unusual movements, behaviors, phonations or vocalizations.  Snoring was noted, worse in supine. EKG was in keeping with  normal sinus rhythm (NSR) and PVCs were seen.    IMPRESSION:  1. Obstructive Sleep Apnea(OSA), at AHI 17.5 . some PVCs , no tachycardia and no hypoxemia of clinical significance. 2. Dream intrusion was reported by patient, but REM sleep onset was delayed. AHI increased during REM to 33.5/hr.   RECOMMENDATIONS:  1. Advise full-night, attended, CPAP titration study to optimize therapy. After 30 days of CPAP use, we should evaluate Epworth score and abnormal dream intrusion once again. If still present, follow with MSLT.   2. Further information regarding OSA may be obtained from [employer] (www.sleepfoundation.org) or American Sleep Apnea Association (www.sleepapnea.org). 3. A follow up appointment will be scheduled in the Sleep Clinic at [facility]. The referring provider will be notified of the results.      I certify that I have reviewed the entire raw data recording prior to the issuance of this report in accordance with the Standards of Accreditation of the American Academy of Sleep Medicine (AASM)    Larey Seat, MD  03-02-2016  Diplomat, American Board of Psychiatry and Neurology  Diplomat, [place] [facility] [place] Director, [facility] [place] [facility]

## 2016-03-02 NOTE — Telephone Encounter (Signed)
IMPRESSION:  1. Obstructive Sleep Apnea(OSA), at AHI 17.5 with  some  EKG changes (PVCs , no tachycardia )and without hypoxemia of clinical significance. 2. Dream intrusion was reported by patient, but REM sleep onset was delayed. AHI increased during REM to 33.5/hr.   RECOMMENDATIONS:  1. Advise full-night, attended, CPAP titration study to optimize therapy. we will re-evaluate Epworth score and abnormal dream intrusion once again after 30 days of CPAP use . If EDS ( excessive daytime sleepiness) is still present, follow with MSLT.   2. Further information regarding OSA may be obtained from [employer] (www.sleepfoundation.org) or American Sleep Apnea Association (www.sleepapnea.org). 3. A follow up appointment will be scheduled in the Sleep Clinic at [facility]. The referring provider will be notified of the results.

## 2016-03-03 NOTE — Telephone Encounter (Signed)
Patient is returning your call and can be reached after 4 pm at [phone].

## 2016-03-03 NOTE — Telephone Encounter (Signed)
-----   Message from Larey Seat, MD sent at 03/02/2016  8:28 AM EST ----- IMPRESSION:  1. Obstructive Sleep Apnea(OSA), at AHI 17.5 with  some  EKG changes (PVCs , no tachycardia )and without hypoxemia of clinical significance. 2. Dream intrusion was reported by patient, but REM sleep onset was delayed. AHI increased during REM to 33.5/hr.   RECOMMENDATIONS:  1. Advise full-night, attended, CPAP titration study to optimize therapy. After 30 days of CPAP use, we should evaluate Epworth score and abnormal dream intrusion once again. If still present, follow with MSLT.   2. Further information regarding OSA may be obtained from [employer] (www.sleepfoundation.org) or American Sleep Apnea Association (www.sleepapnea.org). 3. A follow up appointment will be scheduled in the Sleep Clinic at [facility]. The referring provider will be notified of the results.

## 2016-03-03 NOTE — Telephone Encounter (Signed)
L/m for patient to call back

## 2016-03-04 NOTE — Telephone Encounter (Signed)
I called patient back. She is aware of results and recommendations. She is already scheduled for titration study. Copy of report sent to PCP

## 2016-03-05 ENCOUNTER — Emergency Department (HOSPITAL_COMMUNITY)
Admission: EM | Admit: 2016-03-05 | Discharge: 2016-03-05 | Disposition: A | Discharge status: Home / Self Care | Payer: Medicare Other | Attending: Emergency Medicine | Admitting: Emergency Medicine

## 2016-03-05 ENCOUNTER — Encounter (HOSPITAL_COMMUNITY): Payer: Self-pay

## 2016-03-05 DIAGNOSIS — T887XXA Unspecified adverse effect of drug or medicament, initial encounter: Secondary | ICD-10-CM | POA: Diagnosis not present

## 2016-03-05 DIAGNOSIS — Z87891 Personal history of nicotine dependence: Secondary | ICD-10-CM | POA: Diagnosis not present

## 2016-03-05 DIAGNOSIS — R21 Rash and other nonspecific skin eruption: Secondary | ICD-10-CM | POA: Insufficient documentation

## 2016-03-05 DIAGNOSIS — Y829 Unspecified medical devices associated with adverse incidents: Secondary | ICD-10-CM | POA: Insufficient documentation

## 2016-03-05 DIAGNOSIS — I10 Essential (primary) hypertension: Secondary | ICD-10-CM | POA: Insufficient documentation

## 2016-03-05 DIAGNOSIS — T502X5A Adverse effect of carbonic-anhydrase inhibitors, benzothiadiazides and other diuretics, initial encounter: Secondary | ICD-10-CM | POA: Insufficient documentation

## 2016-03-05 DIAGNOSIS — Z79899 Other long term (current) drug therapy: Secondary | ICD-10-CM | POA: Insufficient documentation

## 2016-03-05 HISTORY — DX: Unspecified porphyria: E80.20

## 2016-03-05 LAB — CBC WITH DIFFERENTIAL/PLATELET
BASOS PCT: 0 %
Basophils Absolute: 0 10*3/uL (ref 0.0–0.1)
Eosinophils Absolute: 0.2 10*3/uL (ref 0.0–0.7)
Eosinophils Relative: 3 %
HCT: 33.8 % — ABNORMAL LOW (ref 36.0–46.0)
Hemoglobin: 11.1 g/dL — ABNORMAL LOW (ref 12.0–15.0)
LYMPHS ABS: 1.3 10*3/uL (ref 0.7–4.0)
LYMPHS PCT: 25 %
MCH: 32.2 pg (ref 26.0–34.0)
MCHC: 32.8 g/dL (ref 30.0–36.0)
MCV: 98 fL (ref 78.0–100.0)
MONO ABS: 0.5 10*3/uL (ref 0.1–1.0)
MONOS PCT: 8 %
Neutro Abs: 3.4 10*3/uL (ref 1.7–7.7)
Neutrophils Relative %: 64 %
Platelets: 215 10*3/uL (ref 150–400)
RBC: 3.45 MIL/uL — ABNORMAL LOW (ref 3.87–5.11)
RDW: 13.5 % (ref 11.5–15.5)
WBC: 5.3 10*3/uL (ref 4.0–10.5)

## 2016-03-05 LAB — COMPREHENSIVE METABOLIC PANEL
ALK PHOS: 49 U/L (ref 38–126)
ALT: 15 U/L (ref 14–54)
ANION GAP: 8 (ref 5–15)
AST: 24 U/L (ref 15–41)
Albumin: 3.3 g/dL — ABNORMAL LOW (ref 3.5–5.0)
BILIRUBIN TOTAL: 0.7 mg/dL (ref 0.3–1.2)
BUN: 10 mg/dL (ref 6–20)
CALCIUM: 8.6 mg/dL — AB (ref 8.9–10.3)
CO2: 27 mmol/L (ref 22–32)
Chloride: 104 mmol/L (ref 101–111)
Creatinine, Ser: 0.54 mg/dL (ref 0.44–1.00)
Glucose, Bld: 85 mg/dL (ref 65–99)
POTASSIUM: 3.7 mmol/L (ref 3.5–5.1)
Sodium: 139 mmol/L (ref 135–145)
TOTAL PROTEIN: 5.4 g/dL — AB (ref 6.5–8.1)

## 2016-03-05 MED ORDER — DEXAMETHASONE SODIUM PHOSPHATE 10 MG/ML IJ SOLN
10.0000 mg | Freq: Once | INTRAMUSCULAR | Status: AC
  Administered 2016-03-05: 10 mg via INTRAVENOUS
  Filled 2016-03-05: qty 1

## 2016-03-05 MED ORDER — HYDROXYZINE HCL 25 MG PO TABS: 12.5000 mg | ORAL_TABLET | Freq: Four times a day (QID) | ORAL | 0 refills | Status: DC

## 2016-03-05 MED ORDER — SODIUM CHLORIDE 0.9 % IV BOLUS (SEPSIS)
1000.0000 mL | Freq: Once | INTRAVENOUS | Status: AC
  Administered 2016-03-05: 1000 mL via INTRAVENOUS

## 2016-03-05 MED ORDER — PREDNISONE 10 MG (21) PO TBPK: ORAL_TABLET | ORAL | 0 refills | Status: DC

## 2016-03-05 NOTE — ED Provider Notes (Signed)
[facility] DEPT Provider Note   CSN: [id number] Arrival date & time: 03/05/16  1112     History   Chief Complaint Chief Complaint  Patient presents with  . Allergic Reaction    HPI Angela [name] is a 71 y.o. female.  HPI   Angela [name] is a 71 y.o. female, with a history of Porphyria and HTN, presenting to the ED with worsening skin lesions. Lesions are red, painful, and individual, scattered across mostly the chest, back and abdomen. The bumps form, they itch, then they form blisters that produce clear fluid when they rupture.   Began to have outbreaks in January due to taking ASA. Saw Harriett Sine at [facility] dermatology and was prescribed prednisone BID for 10 days and then daily for 7 days. Symptoms seemed to improve. Finished the prednisone about 10 days ago.   She learned she was not supposed to be taking diuretic mediations. She went to her PCP office last week to inquire about this. The PA in the office switched her off the lisinopril-HCTZ combo and put her on Losartan-HCTZ. Pt then began to have quickly worsening outbreaks of painful, red, tender lesions. She stopped taking this medication three days ago, but lesions have continued to worsen.    States she learned of a [place] expert in [place] at [facility], [employer]. States they will see her, but needs a ALS/PBG urine test performed. She asked her dermatologist to do this test. She states, "I guess he was offended that I contacted the specialist directly and he refused to run the test for me."  Denies fever/chills, neck pain or stiffness, N/V/D, SOB, abdominal pain, or any other complaints.    Past Medical History:  Diagnosis Date  . Chronic pain   . Headache   . Hypertension   . Joint disorder   . Porphyria [facility])     Patient Active Problem List   Diagnosis Date Noted  . New onset headache 01/19/2016  . Excessive sleepiness 01/19/2016  . HYPERTENSION, BENIGN 03/10/2009  . HYPERTENSION,  UNCONTROLLED 01/29/2009  . ARM PAIN, LEFT 01/29/2009    Past Surgical History:  Procedure Laterality Date  . ABDOMINAL HYSTERECTOMY     Age 89  . ANKLE SURGERY     x3  . DILATION AND CURETTAGE OF UTERUS     x 4  . KNEE SURGERY     x 3  . MANDIBLE FRACTURE SURGERY     x 19    OB History    No data available       Home Medications    Prior to Admission medications   Medication Sig Start Date End Date Taking? Authorizing Provider  ARMOUR THYROID PO Take 45 mcg by mouth daily.    Yes Historical Provider, MD  buprenorphine (SUBUTEX) 8 MG SUBL Place 4 mg under the tongue 5 (five) times daily.   Yes Historical Provider, MD  Butalbital-APAP-Caff-Cod 65-300-40-30 MG CAPS As needed for headaches Patient taking differently: Take 1 capsule by mouth daily as needed (headaches). As needed for headaches 01/19/16  Yes Marcial Pacas, MD  cholecalciferol (VITAMIN D) 1000 units tablet Take 1,000 Units by mouth daily.   Yes Historical Provider, MD  furosemide (LASIX) 20 MG tablet Take 20 mg by mouth daily as needed for fluid.  01/11/16  Yes Historical Provider, MD  losartan-hydrochlorothiazide (HYZAAR) 50-12.5 MG tablet Take 1 tablet by mouth daily. 02/27/16  Yes Historical Provider, MD  Multiple Vitamin (MULTIVITAMIN) tablet Take 1 tablet by mouth daily.  Yes Historical Provider, MD  potassium chloride (K-DUR) 10 MEQ tablet Take 10 mEq by mouth daily.  01/15/16  Yes Historical Provider, MD  hydrOXYzine (ATARAX/VISTARIL) 25 MG tablet Take 0.5 tablets (12.5 mg total) by mouth every 6 (six) hours. 03/05/16   Ayani Ospina C Kaileia Flow, PA-C  lisinopril-hydrochlorothiazide (PRINZIDE,ZESTORETIC) 10-12.5 MG per tablet Take 1 tablet by mouth daily. Patient not taking: Reported on 03/05/2016 04/29/11   Orma Flaming, MD  predniSONE (STERAPRED UNI-PAK 21 TAB) 10 MG (21) TBPK tablet Take 6 tabs by mouth daily  for 2 days, then 5 tabs for 2 days, then 4 tabs for 2 days, then 3 tabs for 2 days, 2 tabs for 2 days, then 1 tab by mouth  daily for 2 days 03/05/16   Lorayne Bender, PA-C    Family History Family History  Problem Relation Age of Onset  . Breast cancer Mother   . Rheum arthritis Mother   . Aneurysm Father   . Heart disease Father   . Throat cancer Father   . Melanoma Father   . Heart disease Brother   . Heart disease Brother   . Thyroid disease Brother   . Post-traumatic stress disorder Brother   . Rheum arthritis Brother   . Polymyositis Brother   . Obesity Brother     Social History Social History  Substance Use Topics  . Smoking status: Former Research scientist (life sciences)  . Smokeless tobacco: Never Used     Comment: Quit January 1980  . Alcohol use Yes     Comment: occasional     Allergies   Doxycycline and Nsaids   Review of Systems Review of Systems  Constitutional: Negative for chills, diaphoresis and fever.  Respiratory: Negative for cough and shortness of breath.   Cardiovascular: Negative for chest pain.  Gastrointestinal: Negative for abdominal pain, diarrhea, nausea and vomiting.  Musculoskeletal: Negative for neck pain and neck stiffness.  Skin: Positive for rash.  Neurological: Negative for dizziness, weakness, light-headedness, numbness and headaches.  All other systems reviewed and are negative.    Physical Exam Updated Vital Signs BP 119/75 (BP Location: Left Arm)   Pulse 71   Temp 98.8 F (37.1 C) (Oral)   Resp 16   Ht 5\' 5"  (1.651 m)   Wt 53.5 kg   SpO2 100%   BMI 19.64 kg/m   Physical Exam  Constitutional: She appears well-developed and well-nourished. No distress.  HENT:  Head: Normocephalic and atraumatic.  Mouth/Throat: Oropharynx is clear and moist.  No oral lesions noted.   Eyes: Conjunctivae are normal.  Neck: Normal range of motion. Neck supple.  Cardiovascular: Normal rate, regular rhythm, normal heart sounds and intact distal pulses.   Pulmonary/Chest: Effort normal and breath sounds normal. No respiratory distress.  Abdominal: Soft. There is no tenderness. There  is no guarding.  Musculoskeletal: She exhibits no edema.  Lymphadenopathy:    She has no cervical adenopathy.  Neurological: She is alert.  Skin: Skin is warm and dry. Capillary refill takes less than 2 seconds. She is not diaphoretic.  Erythematous, individual, round slightly raised lesions across the chest, breasts, arms, legs, back, and abdomen. No pustules or vesicles noted, but signs of dry lesions consistent with healing ruptured vesicles.   Psychiatric: She has a normal mood and affect. Her behavior is normal.  Nursing note and vitals reviewed.    ED Treatments / Results  Labs (all labs ordered are listed, but only abnormal results are displayed) Labs Reviewed  CBC WITH DIFFERENTIAL/PLATELET -  Abnormal; Notable for the following:       Result Value   RBC 3.45 (*)    Hemoglobin 11.1 (*)    HCT 33.8 (*)    All other components within normal limits  COMPREHENSIVE METABOLIC PANEL - Abnormal; Notable for the following:    Calcium 8.6 (*)    Total Protein 5.4 (*)    Albumin 3.3 (*)    All other components within normal limits   Hemoglobin  Date Value Ref Range Status  03/05/2016 11.1 (L) 12.0 - 15.0 g/dL Final  07/20/2010 10.4 (L) 12.0 - 15.0 g/dL Final  07/19/2010 10.5 (L) 12.0 - 15.0 g/dL Final  07/18/2010 10.9 (L) 12.0 - 15.0 g/dL Final     EKG  EKG Interpretation None       Radiology No results found.  Procedures Procedures (including critical care time)  Medications Ordered in ED Medications  sodium chloride 0.9 % bolus 1,000 mL (0 mLs Intravenous Stopped 03/05/16 1320)  dexamethasone (DECADRON) injection 10 mg (10 mg Intravenous Given 03/05/16 1245)     Initial Impression / Assessment and Plan / ED Course  I have reviewed the triage vital signs and the nursing notes.  Pertinent labs & imaging results that were available during my care of the patient were reviewed by me and considered in my medical decision making (see chart for details).       Patient presents with a skin outbreak consistent with her porphyria flares. She is otherwise well-appearing with normal vital signs. Patient was advised to follow-up with her PCP regarding her requested urine test. Pt voices significant improvement in her itching and pain with decadron. Return precautions discussed. Patient voices understanding of all instructions and is comfortable with discharge.   Vitals:   03/05/16 1121 03/05/16 1200 03/05/16 1230  BP: 119/75 139/83 126/73  Pulse: 71 63 68  Resp: 16    Temp: 98.8 F (37.1 C)    TempSrc: Oral    SpO2: 100% 100% 100%  Weight: 53.5 kg    Height: 5\' 5"  (1.651 m)         Final Clinical Impressions(s) / ED Diagnoses   Final diagnoses:  Rash    New Prescriptions Discharge Medication List as of 03/05/2016  2:19 PM    START taking these medications   Details  hydrOXYzine (ATARAX/VISTARIL) 25 MG tablet Take 0.5 tablets (12.5 mg total) by mouth every 6 (six) hours., Starting Fri 03/05/2016, Print    predniSONE (STERAPRED UNI-PAK 21 TAB) 10 MG (21) TBPK tablet Take 6 tabs by mouth daily  for 2 days, then 5 tabs for 2 days, then 4 tabs for 2 days, then 3 tabs for 2 days, 2 tabs for 2 days, then 1 tab by mouth daily for 2 days, Print         Lorayne Bender, PA-C 03/07/16 [place], MD 03/07/16 1025

## 2016-03-05 NOTE — Discharge Instructions (Signed)
You were treated with decadron, a steroid today. There were no acute changes on your lab results. Please follow up with your primary care provider for the required testing for your referral.   Take the hydroxyzine as needed for itching. Use caution with this medication as it can make you drowsy.

## 2016-03-05 NOTE — ED Triage Notes (Signed)
Pt reports hx of Porphyria; states she recently developed an itchy rash to her abdomen and her arms, appearing as "red blisters that break and then become sores." She reports this is from Losartan.

## 2016-03-05 NOTE — ED Notes (Signed)
Pt ambulated to the bathroom and back well

## 2016-03-05 NOTE — ED Notes (Signed)
ED Provider at bedside. 

## 2016-03-11 DIAGNOSIS — I10 Essential (primary) hypertension: Secondary | ICD-10-CM | POA: Diagnosis not present

## 2016-03-11 DIAGNOSIS — E039 Hypothyroidism, unspecified: Secondary | ICD-10-CM | POA: Diagnosis not present

## 2016-03-21 ENCOUNTER — Ambulatory Visit (INDEPENDENT_AMBULATORY_CARE_PROVIDER_SITE_OTHER): Payer: Medicare Other | Admitting: Neurology

## 2016-03-21 DIAGNOSIS — R6884 Jaw pain: Principal | ICD-10-CM

## 2016-03-21 DIAGNOSIS — G4733 Obstructive sleep apnea (adult) (pediatric): Secondary | ICD-10-CM | POA: Diagnosis not present

## 2016-03-21 DIAGNOSIS — G8929 Other chronic pain: Secondary | ICD-10-CM

## 2016-03-26 NOTE — Addendum Note (Signed)
Addended by: Larey Seat on: 03/26/2016 11:04 AM   Modules accepted: Orders

## 2016-03-26 NOTE — Procedures (Signed)
PATIENT'S NAME:  Angela [name], Angela [name]  DOB:      10/06/1945      MR#:    [id number]     DATE OF RECORDING: 03/21/2016 REFERRING M.D.:  Marcial Pacas, MD Study Performed:   CPAP  Titration HISTORY:  Mrs. Jha had undergone a diagnostic PSG on 02-22-2016 and was diagnosed with OSA at an AHI of 17.5/hr. , REM AHI of 33.5/hr., without hypoxemia. She returns for CPAP titration.   The patient had originally been scheduled for PSG and MSLT due to EDS chief compliant.  She needs to keep physically active to not get drowsy. Exercise keeps her going, thus she is pacing the office.  She does not remember dreaming at night. She has experienced sleep paralysis.  She craves power naps, but has not often opportunity to sleep, and sometimes find herself unable to nap. When very sleepy, the daydreams intrude - hypnagogic/ hypnopompic hallucinations?  "Crazy , vivid - off the wall dreams ".  The patient endorsed the Epworth Sleepiness Scale at 19 points.   The patient's weight 119 pounds with a height of 65.5 (inches), resulting in a BMI of 19.8 kg/m2. The patient's neck circumference measured 13.5 inches.  CURRENT MEDICATIONS: Armour Thyroid, Subutex, Butalbital-APAP, Lasix, Lisinopril-hydrochlorothiazide, K-Dur, Prednisone  PROCEDURE:  This is a multichannel digital polysomnogram utilizing the SomnoStar 11.2 system.  Electrodes and sensors were applied and monitored per AASM Specifications.   EEG, EOG, Chin and Limb EMG, were sampled at 200 Hz.  ECG, Snore and Nasal Pressure, Thermal Airflow, Respiratory Effort, CPAP Flow and Pressure, Oximetry was sampled at 50 Hz. Digital video and audio were recorded.     CPAP was initiated at 5 cmH20 with heated humidity per AASM split night standards and pressure was advanced to 8/0cmH20 because of hypopneas, apneas and desaturations.  At a PAP pressure of 8 cmH20, there was a reduction of the AHI to 0.0 with improvement of obstructive sleep apnea.    Lights Out was at 22:56 and  Lights On at 05:00. Total recording time (TRT) was 364 minutes, with a total sleep time (TST) of 331 minutes. The patient's sleep latency was 5.5 minutes with 0 minutes of wake time after sleep onset. REM latency was 201.5 minutes.  The sleep efficiency was 90.9 %.    SLEEP ARCHITECTURE: WASO (Wake after sleep onset) was 27.5 minutes.  There were 19 minutes in Stage N1, 205 minutes Stage N2, 46.5 minutes Stage N3 and 60.5 minutes in Stage REM.  The percentage of Stage N1 was 5.7%, Stage N2 was 61.9%, Stage N3 was 14.% and Stage R (REM sleep) was 18.3%.The sleep architecture was notable for REM rebound under CPAP of 7 and 8 cm water   RESPIRATORY ANALYSIS:  There were 3 respiratory events: 0 apneas and 3 hypopneas with 0 respiratory event related arousals (RERAs).   The total APNEA/HYPOPNEA INDEX  (AHI) was 0.5 /hour and the total RESPIRATORY DISTURBANCE INDEX was 0.5 .hour  0 events occurred in REM sleep and 3 events in NREM. The REM AHI was 0.0 /hour versus a non-REM AHI of 0.7 /hour.  The patient spent 331 minutes of total sleep time in the supine position and 0 minutes in non-supine. The supine AHI was 0.5, versus a non-supine AHI of 0.0.  OXYGEN SATURATION & C02:  The baseline 02 saturation was 99%, with the lowest being 94%. Time spent below 89% saturation equaled 0 minutes. CO2 was not measured.   PERIODIC LIMB MOVEMENTS:    The patient  had a total of 0 Periodic Limb Movements.  The arousals were noted as: 18 were spontaneous, 0 were associated with PLMs, and 0 were associated with respiratory events. Audio and video analysis did not show any abnormal or unusual movements, behaviors, phonations or vocalizations.   The patient took one bathroom break. Snoring was eliminated by CPAP. EKG was in keeping with normal sinus rhythm (NSR). The patient prefers the supine sleep position.  Post-study, the patient indicated that sleep was better than usual.   DIAGNOSIS The patient reached all sleep stages  and high sleep efficiency during CPAP titration. Complete resolution of obstructive sleep apnea under CPA Pressure of 8 cm water and heated humidity. The patient was fitted with a ResMed P10 - Small Pillows apparatus.    PLANS/RECOMMENDATIONS:  CPAP will be issued with the above named interface.  The Patient will be seen in a follow up visit after at least 30 days of CPAP use. Main purpose is to follow the Epworth Sleepiness Scale.  If EDS remains high while CPAP is compliantly used, a MSLT will be needed.    A follow up appointment will be scheduled in the Sleep Clinic at [facility].   Please call [phone] with any questions.      I certify that I have reviewed the entire raw data recording prior to the issuance of this report in accordance with the Standards of Accreditation of the American Academy of Sleep Medicine (AASM)   Larey Seat, M.D.    03-26-2016  Diplomat, American Board of Psychiatry and Neurology  Diplomat, [place] of Sleep Medicine Medical Director, [place] [facility] at [facility]

## 2016-03-29 DIAGNOSIS — E559 Vitamin D deficiency, unspecified: Secondary | ICD-10-CM | POA: Diagnosis not present

## 2016-03-29 DIAGNOSIS — E538 Deficiency of other specified B group vitamins: Secondary | ICD-10-CM | POA: Diagnosis not present

## 2016-03-29 DIAGNOSIS — I129 Hypertensive chronic kidney disease with stage 1 through stage 4 chronic kidney disease, or unspecified chronic kidney disease: Secondary | ICD-10-CM | POA: Diagnosis not present

## 2016-03-29 DIAGNOSIS — I10 Essential (primary) hypertension: Secondary | ICD-10-CM | POA: Diagnosis not present

## 2016-03-29 DIAGNOSIS — Z0000 Encounter for general adult medical examination without abnormal findings: Secondary | ICD-10-CM | POA: Diagnosis not present

## 2016-03-31 ENCOUNTER — Telehealth: Payer: Self-pay

## 2016-03-31 NOTE — Telephone Encounter (Signed)
-----   Message from Larey Seat, MD sent at 03/26/2016 11:04 AM EDT ----- DIAGNOSIS The patient reached all sleep stages and high sleep efficiency during CPAP titration. Complete resolution of obstructive sleep apnea and snoring was reached under CPA Pressure of 8 cm water and heated humidity. The patient was fitted with a ResMed P10 - Small Pillows apparatus.    PLANS/RECOMMENDATIONS:  CPAP will be issued with the above named interface.  The Patient will be seen in a follow up visit after at least 30 days of CPAP use. Main purpose is to follow the Epworth Sleepiness Scale.  If EDS remains high while CPAP is compliantly used, a MSLT will be needed.    A follow up appointment will be scheduled in the Sleep Clinic at [facility].   Please call [phone] with any questions.      I certify that I have reviewed the entire raw data recording prior to the issuance of this report in accordance with the Standards of Accreditation of the [place] [employer] of Sleep Medicine (AASM)   Larey Seat, M.D.    03-26-2016

## 2016-03-31 NOTE — Telephone Encounter (Signed)
I called pt. I advised her that Dr. Brett Fairy recommends that she start a cpap. Pt is agreeable to this. I advised her that a DME, [employer], will be calling her to set up her cpap within about one week. I reviewed cpap compliance with the pt. A follow up appt was made for pt on 07/12/2016 at 8:30am. Pt verbalized understanding of results. Pt had no questions at this time but was encouraged to call back if questions arise.

## 2016-04-06 DIAGNOSIS — Z0000 Encounter for general adult medical examination without abnormal findings: Secondary | ICD-10-CM | POA: Diagnosis not present

## 2016-04-06 DIAGNOSIS — N182 Chronic kidney disease, stage 2 (mild): Secondary | ICD-10-CM | POA: Diagnosis not present

## 2016-04-06 DIAGNOSIS — E039 Hypothyroidism, unspecified: Secondary | ICD-10-CM | POA: Diagnosis not present

## 2016-04-06 DIAGNOSIS — R7989 Other specified abnormal findings of blood chemistry: Secondary | ICD-10-CM | POA: Diagnosis not present

## 2016-04-06 DIAGNOSIS — I5032 Chronic diastolic (congestive) heart failure: Secondary | ICD-10-CM | POA: Diagnosis not present

## 2016-04-06 DIAGNOSIS — I129 Hypertensive chronic kidney disease with stage 1 through stage 4 chronic kidney disease, or unspecified chronic kidney disease: Secondary | ICD-10-CM | POA: Diagnosis not present

## 2016-04-29 DIAGNOSIS — Z881 Allergy status to other antibiotic agents status: Secondary | ICD-10-CM | POA: Diagnosis not present

## 2016-04-29 DIAGNOSIS — L139 Bullous disorder, unspecified: Secondary | ICD-10-CM | POA: Diagnosis not present

## 2016-04-29 DIAGNOSIS — Z87891 Personal history of nicotine dependence: Secondary | ICD-10-CM | POA: Diagnosis not present

## 2016-04-29 DIAGNOSIS — E881 Lipodystrophy, not elsewhere classified: Secondary | ICD-10-CM | POA: Diagnosis not present

## 2016-04-29 DIAGNOSIS — Z886 Allergy status to analgesic agent status: Secondary | ICD-10-CM | POA: Diagnosis not present

## 2016-04-29 DIAGNOSIS — F458 Other somatoform disorders: Secondary | ICD-10-CM | POA: Diagnosis not present

## 2016-05-05 ENCOUNTER — Encounter: Payer: Self-pay | Admitting: Internal Medicine

## 2016-06-02 DIAGNOSIS — Z1231 Encounter for screening mammogram for malignant neoplasm of breast: Secondary | ICD-10-CM | POA: Diagnosis not present

## 2016-06-02 DIAGNOSIS — Z803 Family history of malignant neoplasm of breast: Secondary | ICD-10-CM | POA: Diagnosis not present

## 2016-06-05 DIAGNOSIS — L139 Bullous disorder, unspecified: Secondary | ICD-10-CM | POA: Insufficient documentation

## 2016-06-05 DIAGNOSIS — L138 Other specified bullous disorders: Secondary | ICD-10-CM | POA: Insufficient documentation

## 2016-06-05 HISTORY — DX: Other specified bullous disorders: L13.8

## 2016-07-12 ENCOUNTER — Ambulatory Visit (INDEPENDENT_AMBULATORY_CARE_PROVIDER_SITE_OTHER): Payer: Medicare Other | Admitting: Neurology

## 2016-07-12 ENCOUNTER — Encounter: Payer: Self-pay | Admitting: Neurology

## 2016-07-12 VITALS — BP 112/63 | HR 61 | Ht 65.0 in | Wt 118.0 lb

## 2016-07-12 DIAGNOSIS — G4733 Obstructive sleep apnea (adult) (pediatric): Secondary | ICD-10-CM | POA: Diagnosis not present

## 2016-07-12 DIAGNOSIS — G4730 Sleep apnea, unspecified: Secondary | ICD-10-CM | POA: Diagnosis not present

## 2016-07-12 DIAGNOSIS — Z9989 Dependence on other enabling machines and devices: Secondary | ICD-10-CM | POA: Diagnosis not present

## 2016-07-12 DIAGNOSIS — G4710 Hypersomnia, unspecified: Secondary | ICD-10-CM | POA: Diagnosis not present

## 2016-07-12 MED ORDER — MODAFINIL 100 MG PO TABS: 100.0000 mg | ORAL_TABLET | Freq: Every day | ORAL | 5 refills | Status: DC

## 2016-07-12 NOTE — Progress Notes (Signed)
SLEEP MEDICINE CLINIC   Provider:  Larey [name], M D  Referring Provider: Hilbert Bible Primary Care Physician:  Angela Sheller, MD  Chief Complaint  Patient presents with  . Follow-up     new cpap user.     HPI:  Angela [name] is a 71 y.o. female , seen here as a referral from Dr. Krista [name], for evaluation of sudden intruding day dreams, and sleep attacks, fatigue.     Dear Angela [name],  Thank you for entrusting your patient , Angela [name], to my care. To my great surprise Angela [name] sleep study reveals that she has sleep apnea - she underwent a diagnostic polysomnography on this 11th of February 2018 with an AHI of 17.5, a REM AHI of 33.5. There was also a strong supine association noted. She did not have periodic limb movements and audio and video analysis did not show any unusual vocalizations or dream and accident. I asked for her to return for CPAP titration which took place on 03/21/2016 and at the time she was diagnosed with a pressure of 8 cm water eliminating apnea under therapy. Her sleep efficiency was 90.9% This is my first visit with the patient since she has started using CPAP in the home and over the last 30 days she developed a compliance of 93% 4 days of use, but some nights she falls short of 4 hours. However she has done greatly better in terms of her residual apneas which are only 1.9 per hour at a setting of 8 cm water with 3 cm EPR. I would like for the patient to continue her current regimen in spite of some air leaks that she will have some nights. The difficulty is to find them interface it does not slip off, and is not too intrusive for the wearer. She improved compliance with a trend to sleep 4.5 hours each night with CPAP>  She reports still being very drowsy.     Consult CD:   as you know, her chief complaint according to patient is her  voiced concern about driving safety and that she feels insecure about the intruding daydreams, hallucinations.  [name] had seen my colleague Dr. Krista [name] with her primary concern of headaches, but during the conversation with her mentioned that since the summer of 2017, she had noticed episodic visit dreams by reading even by carrying on a conversation with another person, and that these were very intrusive into her thought process. At the same time she developed sleep attacks, excessive daytime sleepiness that was hard to control. She reports that she often leaves the window of her car crack open to have some additional stimulus keeping her from falling asleep, she will drink very cold water, chewing gum, eating sour candies or lemon- all with the intent of keeping her from getting too drowsy. 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 She reports that if the office is not busy and she is not stimulated, she is struggling to stay alert and awake. She needs to keep physically active to not get drowsy. Exercise keeps her going, pacing the room, making a trip to the water cooler.  Her past medical history is recalled in short- multiple TMJ surgeries, hypothyroidism, HTN.  I will order an HLA narcolepsy test as well as the PSG followed by an M SLT.   Sleep habits are as follows:  At bedtime, which varies between 9 PM and 11 PM, she usually only retreats to a cool, quiet and dark bedroom. She likes to sleep flat, always on her back. She does not know if she snores or not and lives alone. She is aware that she is a mouth breather as her mouth is very dry when she wakes. She may have one bathroom break at night, usually between 2 and 4 AM. When she wakes up she feels miserable not restored, not refreshed and this has not been the case 2 years ago. Over the last several months she has been waking up feeling still fatigued and exhausted. She estimates her overall nocturnal sleep time to be 5-6 hours.  She rises in the morning at 5 AM , is spontaneously awake by the time. She does not remember dreaming at night. she has experienced sleep paralysis. She craves power naps , but has not often opportunity to sleep, and sometimes find herself unable to nap. When very sleepy, the daydreams intrude - hypnagogic/ hypnopompic hallucinations?  "Crazy , vivid - off the wall dreams ".   Social history: divorced, adult daughter, Arliss [employer] of 2 companies during her career, now part timer.   Review of Systems: Out of a complete 14 system review, the patient complains of only the following symptoms, and all other reviewed systems are negative. How likely are you to doze in the following situations: 0 = not likely, 1 = slight chance, 2 = moderate chance, 3 = high chance  Sitting and Reading?3 Watching Television?3 Sitting inactive in a public place (theater or meeting)?2 As a passenger in a car for an hour without a break?2  Lying down in the afternoon when circumstances permit?2 Sitting and talking to someone?1 Sitting quietly after lunch without alcohol?2 In a car, while stopped for a few minutes in traffic?1  Total =  16 now on CPAP from 19  Epworth score 16 , Fatigue severity score 57  , depression score 1/15    Social History   Social History  . Marital status: Single    Spouse name: N/A  . Number of children: 1  . Years of education: Bachelors   Occupational History  . Part-time Investment banker, corporate    Social History Main Topics  . Smoking status: Former Games developer  . Smokeless tobacco: Never Used     Comment: Quit January 1980  . Alcohol use Yes     Comment: occasional  . Drug use: No  . Sexual activity: Not on file   Other Topics Concern  . Not on file   Social History Narrative   Lives at home alone.   Right-handed.   2 cups caffeine per day.    Family History  Problem Relation Age of Onset  . Breast cancer Mother   . Rheum arthritis Mother   . Aneurysm Father   . Heart  disease Father   . Throat cancer Father   . Melanoma Father   . Heart disease Brother   . Heart disease Brother   . Thyroid disease Brother   . Post-traumatic stress disorder Brother   . Rheum arthritis Brother   . Polymyositis Brother   . Obesity Brother     Past Medical History:  Diagnosis Date  . Chronic pain   . Headache   . Hypertension   . Joint disorder   . [facility])     Past Surgical History:  Procedure Laterality Date  . ABDOMINAL HYSTERECTOMY     Age  27  . ANKLE SURGERY     x3  . DILATION AND CURETTAGE OF UTERUS     x 4  . KNEE SURGERY     x 3  . MANDIBLE FRACTURE SURGERY     x 19    Current Outpatient Prescriptions  Medication Sig Dispense Refill  . ARMOUR THYROID PO Take 45 mcg by mouth daily.     . buprenorphine (SUBUTEX) 8 MG SUBL Place 4 mg under the tongue 5 (five) times daily.    . Butalbital-APAP-Caff-Cod 50-300-40-30 MG CAPS As needed for headaches (Patient taking differently: Take 1 capsule by mouth daily as needed (headaches). As needed for headaches) 15 capsule 5  . cholecalciferol (VITAMIN D) 1000 units tablet Take 1,000 Units by mouth daily.    . furosemide (LASIX) 20 MG tablet Take 20 mg by mouth daily as needed for fluid.   0  . lisinopril-hydrochlorothiazide (PRINZIDE,ZESTORETIC) 10-12.5 MG per tablet Take 1 tablet by mouth daily. 30 tablet 0  . Multiple Vitamin (MULTIVITAMIN) tablet Take 1 tablet by mouth daily.    . potassium chloride (K-DUR) 10 MEQ tablet Take 10 mEq by mouth daily.   4   No current facility-administered medications for this visit.     Allergies as of 07/12/2016 - Review Complete 07/12/2016  Allergen Reaction Noted  . Doxycycline Nausea And Vomiting and Other (See Comments) 10/21/2011  . Nsaids Other (See Comments) 10/21/2011    Vitals: BP 112/63   Pulse 61   Ht '5\' 5"'$  (1.651 m)   Wt 118 lb (53.5 kg)   BMI 19.64 kg/m  Last Weight:  Wt Readings from Last 1 Encounters:  07/12/16 118 lb (53.5 kg)    JKK:XFGH mass index is 19.64 kg/m.     Last Height:   Ht Readings from Last 1 Encounters:  07/12/16 '5\' 5"'$  (1.651 m)    Physical exam:  General: The patient is awake, alert and appears not in acute distress. The patient is well groomed. Head: Normocephalic, atraumatic. Neck is supple. Mallampati 2-3 neck circumference: 13.5 . Nasal airflow patent , TMJ is evident . Retrognathia is not seen.  Cardiovascular:  Regular rate and rhythm, without  murmurs or carotid bruit, and without distended neck veins. Respiratory: Lungs are clear to auscultation. Skin:   evidence of leg- ankle edema, no rash Trunk: BMI is 20  Neurologic exam : The patient is awake and alert, oriented to place and time.    Attention span & concentration ability appears normal.  Speech is fluent,  without dysarthria, but hoarse- dysphonia.  Mood and affect are appropriate.  Cranial nerves: Pupils are equal and briskly reactive to light. Hearing to finger rub intact.  Facial sensation intact to fine touch. Facial motor strength is symmetric and tongue and uvula move midline. Shoulder shrug was symmetrical. Tongue and uvula move in midline. Normal muscle tone, symmetric muscle bulk.   The patient was advised of the nature of the diagnosed sleep disorder , the treatment options and risks for general a health and wellness arising from not treating the condition. I diagnosed OSA but persistent hypersomnia.  I spent more than 25 minutes of face to face time with the patient. Greater than 50% of time was spent in counseling and coordination of care. I have discussed the diagnosis and differential and I answered the patient's questions now.     Assessment:  After physical and neurologic examination, review of laboratory studies,  Personal review of imaging studies, reports of other /same  Imaging studies ,  Results of polysomnography/ neurophysiology testing and pre-existing records as far as provided in visit., my assessment is    1) I  was not suspicious that Angela [name] will have a form of sleep disordered breathing, but I was highly suspicious that she may have narcolepsy, yet she was diagnosed with PSA and is now using CPAP. Her sleepiness is slightly improved, but her Epworth score stays at 16 points.  The persistent hypersomnia was not explained bythe negative  HLA narcolepsy test ( 02-12-2016), an MSLT was not yet performed.     Plan:  Treatment plan and additional workup :  I am happy with Angela [name]'s efforts and success in controlling her sleep apnea. She was equally surprised to learn that she has sleep apnea and she has gradually gotten used to CPAP. I like for her to have every night to use the time of 4 hours or more to improve compliance further. She does like her nasal pillow better than a nasal mask and she can continue using this device. The needs to be no adjustments in the settings. My next goal is to offer her modafinil as the patient was treated sleep apnea but still suffers from hypersomnia.   RV next in 6 month with Np for sleepiness evaluation on modafinil medication. CPAP compliance.    Angela Partridge Shawnika Pepin MD  07/12/2016   CC: Dr Angela [name], [address], [address], [place] [id number]

## 2016-07-12 NOTE — Patient Instructions (Signed)
Hypersomnia Hypersomnia is when you feel extremely tired during the day even though you're getting plenty of sleep at night. You may need to take naps during the day, and you may also be extremely difficult to wake up when you are sleeping. What are the causes? The cause of your hypersomnia may not be known. Hypersomnia may be caused by:  Medicines.  Sleep disorders, such as narcolepsy.  Trauma or injury to your head or nervous system.  Using drugs or alcohol.  Tumors.  Medical conditions, such as depression or hypothyroidism.  Genetics.  What are the signs or symptoms? The main symptoms of hypersomnia include:  Feeling extremely tired throughout the day.  Being very difficult to wake up.  Sleeping for longer and longer periods.  Taking naps throughout the day.  Other symptoms may include:  Feeling: ? Restless. ? Annoyed. ? Anxious. ? Low energy.  Having difficulty: ? Remembering. ? Speaking. ? Thinking.  Losing your appetite.  Experiencing hallucinations.  How is this diagnosed? Hypersomnia may be diagnosed by:  Medical history and physical exam. This will include a sleep history.  Completing sleep logs.  Tests may also be done, such as: ? Polysomnography. ? Multiple sleep latency test (MSLT).  How is this treated? There is no cure for hypersomnia, but treatment can be very effective in helping manage the condition. Treatment may include:  Lifestyle and sleeping strategies to help cope with the condition.  Stimulant medicines.  Treating any underlying causes of hypersomnia.  Follow these instructions at home:  Take medicines only as directed by your health care provider.  Schedule short naps for when you feel sleepiest during the day. Tell your employer or teachers that you have hypersomnia. You may be able to adjust your schedule to include time for naps.  Avoid drinking alcohol or caffeinated beverages.  Do not eat a heavy meal before  bedtime. Eat at about the same times every day.  Do not drive or operate heavy machinery if you are sleepy.  Do not swim or go out on the water without a life jacket.  If possible, adjust your schedule so that you do not have to work or be active at night.  Keep all follow-up visits as directed by your health care provider. This is important. Contact a health care provider if:  You have new symptoms.  Your symptoms get worse. Get help right away if: You have serious thoughts of hurting yourself or someone else. This information is not intended to replace advice given to you by your health care provider. Make sure you discuss any questions you have with your health care provider. Document Released: 12/18/2001 Document Revised: 06/05/2015 Document Reviewed: 08/02/2013 Elsevier Interactive Patient Education  2018 Elsevier Inc.  

## 2016-07-15 ENCOUNTER — Telehealth: Payer: Self-pay | Admitting: Neurology

## 2016-07-15 NOTE — Telephone Encounter (Signed)
Completed PA for Modafinil. Should have a determination within 72 hrs. Sent to [facility].

## 2016-07-26 NOTE — Telephone Encounter (Signed)
PA for Modafinil has been approved and coverage will end on July 15 2017.

## 2016-07-27 NOTE — Telephone Encounter (Signed)
Pt called said [facility] does not have RX. She has spoke with pharmacy on 2 different occassions, last night being one of them. Please call her [phone]) when RX has been sent

## 2016-08-08 ENCOUNTER — Other Ambulatory Visit: Payer: Self-pay | Admitting: Neurology

## 2016-08-24 ENCOUNTER — Encounter: Payer: Self-pay | Admitting: Neurology

## 2016-08-24 ENCOUNTER — Telehealth: Payer: Self-pay | Admitting: Neurology

## 2016-08-24 DIAGNOSIS — G4719 Other hypersomnia: Secondary | ICD-10-CM

## 2016-08-24 MED ORDER — AMPHETAMINE-DEXTROAMPHETAMINE 10 MG PO TABS: 10.0000 mg | ORAL_TABLET | Freq: Every day | ORAL | 0 refills | Status: DC

## 2016-08-24 NOTE — Addendum Note (Signed)
Addended by: Larey Seat on: 08/24/2016 06:07 PM   Modules accepted: Orders

## 2016-08-24 NOTE — Telephone Encounter (Signed)
Pt calling re: the Modafinil, she said she does not feel it working at all.  She is sleepy all the time and would like to be called to discuss what else may be suggested
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
## 2016-08-25 DIAGNOSIS — I509 Heart failure, unspecified: Secondary | ICD-10-CM | POA: Diagnosis not present

## 2016-08-25 DIAGNOSIS — M810 Age-related osteoporosis without current pathological fracture: Secondary | ICD-10-CM | POA: Diagnosis not present

## 2016-08-25 DIAGNOSIS — I10 Essential (primary) hypertension: Secondary | ICD-10-CM | POA: Diagnosis not present

## 2016-08-25 DIAGNOSIS — E039 Hypothyroidism, unspecified: Secondary | ICD-10-CM | POA: Diagnosis not present

## 2016-09-23 DIAGNOSIS — M810 Age-related osteoporosis without current pathological fracture: Secondary | ICD-10-CM | POA: Diagnosis not present

## 2016-09-23 DIAGNOSIS — M8589 Other specified disorders of bone density and structure, multiple sites: Secondary | ICD-10-CM | POA: Diagnosis not present

## 2016-09-29 ENCOUNTER — Ambulatory Visit (INDEPENDENT_AMBULATORY_CARE_PROVIDER_SITE_OTHER): Payer: Medicare Other | Admitting: Neurology

## 2016-09-29 DIAGNOSIS — G4733 Obstructive sleep apnea (adult) (pediatric): Secondary | ICD-10-CM | POA: Diagnosis not present

## 2016-09-29 DIAGNOSIS — G4719 Other hypersomnia: Secondary | ICD-10-CM

## 2016-09-29 DIAGNOSIS — E7800 Pure hypercholesterolemia, unspecified: Secondary | ICD-10-CM | POA: Diagnosis not present

## 2016-09-29 DIAGNOSIS — Z79899 Other long term (current) drug therapy: Secondary | ICD-10-CM | POA: Diagnosis not present

## 2016-09-30 ENCOUNTER — Ambulatory Visit: Payer: Medicare Other

## 2016-09-30 ENCOUNTER — Telehealth: Payer: Self-pay | Admitting: Neurology

## 2016-09-30 ENCOUNTER — Other Ambulatory Visit: Payer: Self-pay | Admitting: Neurology

## 2016-09-30 MED ORDER — AMPHETAMINE-DEXTROAMPHETAMINE 10 MG PO TABS: 10.0000 mg | ORAL_TABLET | Freq: Every day | ORAL | 0 refills | Status: DC

## 2016-09-30 NOTE — Telephone Encounter (Signed)
Script is ready for pickup at the front desk.

## 2016-09-30 NOTE — Telephone Encounter (Signed)
Pt request refill for amphetamine-dextroamphetamine (ADDERALL) 10 MG tablet . Pt said she will be out tomorrow. Pt is wanting to pick it tomorrow and is aware the clinic closes at noon tomorrow

## 2016-10-01 ENCOUNTER — Telehealth: Payer: Self-pay | Admitting: Neurology

## 2016-10-01 DIAGNOSIS — Z9989 Dependence on other enabling machines and devices: Principal | ICD-10-CM

## 2016-10-01 DIAGNOSIS — G4733 Obstructive sleep apnea (adult) (pediatric): Secondary | ICD-10-CM

## 2016-10-01 DIAGNOSIS — G4710 Hypersomnia, unspecified: Secondary | ICD-10-CM

## 2016-10-01 NOTE — Telephone Encounter (Signed)
Patient's sleep study encounter is posted under generic provider and cannot be closed, therefor doesn't generate a RESULT NOTE. This is an attempt to post a result.  PATIENT'S NAME:  Angela [name], Angela [name] DOB:      [birth year]      MR#:    [id number]     DATE OF RECORDING: 09/29/2016 REFERRING M.D.:  Marcial Pacas, MD Study Performed:   CPAP  Titration HISTORY:  Patient returned for re-titration study. Angela [name] was diagnosed with sleep apnea 02/22/2016 with an AHI of 17.5 and REM AHI of 33.5/hr. She was than titrated to 8 cm water CPAP in 03-21-2016 and was compliant, but her Epworth score remained high. Residual AHI was high. She has still headaches and nocturia.  The patient endorsed the Epworth Sleepiness Scale at 16 points and the Fatigue Score at 57 points.   The patient's weight 119 pounds with a height of 65 (inches), resulting in a BMI of 19.8 kg/m2. The patient's neck circumference measured 13.5 inches.  CURRENT MEDICATIONS: Armor Thyroid, Subutex, Butalbital-APAP, Lasix, Lisinopril-hydrochlorothiazide, K-Dur, Prednisone   PROCEDURE:  This is a multichannel digital polysomnogram utilizing the SomnoStar 11.2 system.  Electrodes and sensors were applied and monitored per AASM Specifications.   EEG, EOG, Chin and Limb EMG, were sampled at 200 Hz.  ECG, Snore and Nasal Pressure, Thermal Airflow, Respiratory Effort, CPAP Flow and Pressure, Oximetry was sampled at 50 Hz. Digital video and audio were recorded.     CPAP was initiated at 5 cmH20 with heated humidity per AASM split night standards and pressure was advanced to 12/12cmH20 because of hypopneas, apneas and desaturations.  At a PAP pressure of 12 cmH20, there was a reduction of the AHI to 2.2 with improvement of obstructive sleep apnea.    Lights Out was at 21:17 and Lights On at 05:01. Total recording time (TRT) was 464.5 minutes, with a total sleep time (TST) of 383.5 minutes. The patient's sleep latency was 5.5 minutes with 8 minutes of wake time  after sleep onset. REM latency was 60.5 minutes.  The sleep efficiency was 82.6 %.    SLEEP ARCHITECTURE: WASO (Wake after sleep onset) was 67.5 minutes.  There were 35 minutes in Stage N1, 102 minutes Stage N2, 172 minutes Stage N3 and 74.5 minutes in Stage REM.  The percentage of Stage N1 was 9.1%, Stage N2 was 26.6%, Stage N3 was 44.9% and Stage R (REM sleep) was 19.4%.   RESPIRATORY ANALYSIS:  There was a total of 13 respiratory events: 0 obstructive apneas, 1 central apnea and 12 hypopneas. The patient also had 2 respiratory event related arousals (RERAs).     The total APNEA/HYPOPNEA INDEX (AHI) was 2.0 /hour and the total RESPIRATORY DISTURBANCE INDEX was 2.3/ hour  4 events occurred in REM sleep and 9 events in NREM. The REM AHI was 3.2 /hour versus a non-REM AHI of 1.7 /hour.  The patient spent 310 minutes of total sleep time in the supine position and 74 minutes in non-supine. The supine AHI was 2.5, versus a non-supine AHI of 0.0.  OXYGEN SATURATION & C02:  The baseline 02 saturation was 92%, with the lowest being 86%. Time spent below 89% saturation equaled 2 minutes.  PERIODIC LIMB MOVEMENTS:    The patient had a total of 0 Periodic Limb Movements. The arousals were noted as: 56 were spontaneous, 0 were associated with PLMs, and 10 were associated with respiratory events.  Audio and video analysis did not show any abnormal or unusual movements, behaviors,  phonations or vocalizations.   The patient took 2 bathroom breaks. Some Snoring was noted. EKG was in keeping with normal sinus rhythm and PACs. The patient was fitted with a Respironics Amara view apparatus after nasal masks and pillows were unsuitable due to oral air venting.   DIAGNOSIS Primary Snoring and Obstructive Sleep Apnea were present- The patient has had difficulties to sleep. Finally tolerating a FFM with a pressure of 12 cm water, 1 cm EPR, residual AHI was 2.2/hr. and this CPAP setting reached 85% sleep efficiency.      PLANS/RECOMMENDATIONS:  CPAP at 12 cm water with 1 cm EPR and FFM Amara View in small size.  A follow up appointment will be scheduled in the Sleep Clinic at [facility]. Should hypersomnia persist, may need a narcolepsy PSG/MSLT work up.  Please call [phone] with any questions.      I certify that I have reviewed the entire raw data recording prior to the issuance of this report in accordance with the Standards of Accreditation of the American Academy of Sleep Medicine (AASM)      Larey Seat, M.D.   10-01-2016  Diplomat, American Board of Psychiatry and Neurology  Diplomat, [place] of Sleep Medicine Medical Director, [facility] [place] [facility] [place].

## 2016-10-01 NOTE — Procedures (Unsigned)
PATIENT'S NAME:  Angela [name], Angela [name] DOB:      1945-10-05      MR#:    [id number]     DATE OF RECORDING: 09/29/2016 REFERRING M.D.:  Marcial Pacas, MD Study Performed:   CPAP  Titration HISTORY:  Patient returned for re-titration study. Angela [name] 02/22/2016 with an AHI of 17.5 and REM AHI of 33.5/hr. She was than titrated to 8 cm water CPAP in 03-21-2016 and was compliant, but her Epworth score remained high. Residual AHI was high. She has still headaches and nocturia.  The patient endorsed the Epworth Sleepiness Scale at 16 points and the Fatigue Score at 57 points.   The patient's weight 119 pounds with a height of 65 (inches), resulting in a BMI of 19.8 kg/m2. The patient's neck circumference measured 13.5 inches.  CURRENT MEDICATIONS: Armor Thyroid, Subutex, Butalbital-APAP, Lasix, Lisinopril-hydrochlorothiazide, K-Dur, Prednisone   PROCEDURE:  This is a multichannel digital polysomnogram utilizing the SomnoStar 11.2 system.  Electrodes and sensors were applied and monitored per AASM Specifications.   EEG, EOG, Chin and Limb EMG, were sampled at 200 Hz.  ECG, Snore and Nasal Pressure, Thermal Airflow, Respiratory Effort, CPAP Flow and Pressure, Oximetry was sampled at 50 Hz. Digital video and audio were recorded.     CPAP was initiated at 5 cmH20 with heated humidity per AASM split night standards and pressure was advanced to 12/12cmH20 because of hypopneas, apneas and desaturations.  At a PAP pressure of 12 cmH20, there was a reduction of the AHI to 2.2 with improvement of obstructive sleep [name].    Lights Out was at 21:17 and Lights On at 05:01. Total recording time (TRT) was 464.5 minutes, with a total sleep time (TST) of 383.5 minutes. The patient's sleep latency was 5.5 minutes with 8 minutes of wake time after sleep onset. REM latency was 60.5 minutes.  The sleep efficiency was 82.6 %.    SLEEP ARCHITECTURE: WASO (Wake after sleep onset) was 67.5 minutes.  There were 35  minutes in Stage N1, 102 minutes Stage N2, 172 minutes Stage N3 and 74.5 minutes in Stage REM.  The percentage of Stage N1 was 9.1%, Stage N2 was 26.6%, Stage N3 was 44.9% and Stage R (REM sleep) was 19.4%.   RESPIRATORY ANALYSIS:  There was a total of 13 respiratory events: 0 obstructive apneas, 1 central [name] and 12 hypopneas. The patient also had 2 respiratory event related arousals (RERAs).     The total [name]/HYPOPNEA INDEX (AHI) was 2.0 /hour and the total RESPIRATORY DISTURBANCE INDEX was 2.3/ hour  4 events occurred in REM sleep and 9 events in NREM. The REM AHI was 3.2 /hour versus a non-REM AHI of 1.7 /hour.  The patient spent 310 minutes of total sleep time in the supine position and 74 minutes in non-supine. The supine AHI was 2.5, versus a non-supine AHI of 0.0.  OXYGEN SATURATION & C02:  The baseline 02 saturation was 92%, with the lowest being 86%. Time spent below 89% saturation equaled 2 minutes.  PERIODIC LIMB MOVEMENTS:    The patient had a total of 0 Periodic Limb Movements. The arousals were noted as: 56 were spontaneous, 0 were associated with PLMs, and 10 were associated with respiratory events.  Audio and video analysis did not show any abnormal or unusual movements, behaviors, phonations or vocalizations.   The patient took 2 bathroom breaks. Some Snoring was noted. EKG was in keeping with normal sinus rhythm and PACs. The patient was  fitted with a Respironics Amara view apparatus after nasal masks and pillows were unsuitable due to oral air venting.   DIAGNOSIS Primary Snoring and Obstructive Sleep [name] were present- The patient has had difficulties to sleep. Finally tolerating a FFM with a pressure of 12 cm water, 1 cm EPR, residual AHI was 2.2/hr. and this CPAP setting reached 85% sleep efficiency.     PLANS/RECOMMENDATIONS:  CPAP at 12 cm water with 1 cm EPR and FFM Amara View in small size.  A follow up appointment will be scheduled in the Sleep Clinic at [facility]. Should hypersomnia persist, may need a narcolepsy PSG/MSLT work up.  Please call [phone] with any questions.      I certify that I have reviewed the entire raw data recording prior to the issuance of this report in accordance with the Standards of Accreditation of the American Academy of Sleep Medicine (AASM)      Larey Seat, M.D.   10-01-2016  Diplomat, American Board of Psychiatry and Neurology  Diplomat, [place] of Sleep Medicine Medical Director, [place] [facility] at [facility]

## 2016-10-01 NOTE — Patient Instructions (Signed)
PATIENT'S NAME:  Angela [name], Angela [name] DOB:      March 22, 1945      MR#:    [id number]     DATE OF RECORDING: 09/29/2016 REFERRING M.D.:  Marcial Pacas, MD Study Performed:   CPAP  Titration HISTORY:  Patient returned for re-titration study. Angela [name] was diagnosed with sleep apnea 02/22/2016 with an AHI of 17.5 and REM AHI of 33.5/hr. She was than titrated to 8 cm water CPAP in 03-21-2016 and was compliant, but her Epworth score remained high. Residual AHI was high. She has still headaches and nocturia.  The patient endorsed the Epworth Sleepiness Scale at 16 points and the Fatigue Score at 57 points.   The patient's weight 119 pounds with a height of 65 (inches), resulting in a BMI of 19.8 kg/m2. The patient's neck circumference measured 13.5 inches.  CURRENT MEDICATIONS: Armor Thyroid, Subutex, Butalbital-APAP, Lasix, Lisinopril-hydrochlorothiazide, K-Dur, Prednisone   PROCEDURE:  This is a multichannel digital polysomnogram utilizing the SomnoStar 11.2 system.  Electrodes and sensors were applied and monitored per AASM Specifications.   EEG, EOG, Chin and Limb EMG, were sampled at 200 Hz.  ECG, Snore and Nasal Pressure, Thermal Airflow, Respiratory Effort, CPAP Flow and Pressure, Oximetry was sampled at 50 Hz. Digital video and audio were recorded.     CPAP was initiated at 5 cmH20 with heated humidity per AASM split night standards and pressure was advanced to 12/12cmH20 because of hypopneas, apneas and desaturations.  At a PAP pressure of 12 cmH20, there was a reduction of the AHI to 2.2 with improvement of obstructive sleep apnea.    Lights Out was at 21:17 and Lights On at 05:01. Total recording time (TRT) was 464.5 minutes, with a total sleep time (TST) of 383.5 minutes. The patient's sleep latency was 5.5 minutes with 8 minutes of wake time after sleep onset. REM latency was 60.5 minutes.  The sleep efficiency was 82.6 %.    SLEEP ARCHITECTURE: WASO (Wake after sleep onset) was 67.5 minutes.  There were 35  minutes in Stage N1, 102 minutes Stage N2, 172 minutes Stage N3 and 74.5 minutes in Stage REM.  The percentage of Stage N1 was 9.1%, Stage N2 was 26.6%, Stage N3 was 44.9% and Stage R (REM sleep) was 19.4%.   RESPIRATORY ANALYSIS:  There was a total of 13 respiratory events: 0 obstructive apneas, 1 central apnea and 12 hypopneas. The patient also had 2 respiratory event related arousals (RERAs).     The total APNEA/HYPOPNEA INDEX (AHI) was 2.0 /hour and the total RESPIRATORY DISTURBANCE INDEX was 2.3/ hour  4 events occurred in REM sleep and 9 events in NREM. The REM AHI was 3.2 /hour versus a non-REM AHI of 1.7 /hour.  The patient spent 310 minutes of total sleep time in the supine position and 74 minutes in non-supine. The supine AHI was 2.5, versus a non-supine AHI of 0.0.  OXYGEN SATURATION & C02:  The baseline 02 saturation was 92%, with the lowest being 86%. Time spent below 89% saturation equaled 2 minutes.  PERIODIC LIMB MOVEMENTS:    The patient had a total of 0 Periodic Limb Movements. The arousals were noted as: 56 were spontaneous, 0 were associated with PLMs, and 10 were associated with respiratory events.  Audio and video analysis did not show any abnormal or unusual movements, behaviors, phonations or vocalizations.   The patient took 2 bathroom breaks. Some Snoring was noted. EKG was in keeping with normal sinus rhythm and PACs. The patient was  fitted with a Respironics Amara view apparatus after nasal masks and pillows were unsuitable due to oral air venting.   DIAGNOSIS Primary Snoring and Obstructive Sleep Apnea were present- The patient has had difficulties to sleep. Finally tolerating a FFM with a pressure of 12 cm water, 1 cm EPR, residual AHI was 2.2/hr. and this CPAP setting reached 85% sleep efficiency.     PLANS/RECOMMENDATIONS:  CPAP at 12 cm water with 1 cm EPR and FFM Amara View in small size.  A follow up appointment will be scheduled in the Sleep Clinic at [facility]. Should hypersomnia persist, may need a narcolepsy PSG/MSLT work up.  Please call [phone] with any questions.      I certify that I have reviewed the entire raw data recording prior to the issuance of this report in accordance with the Standards of Accreditation of the American Academy of Sleep Medicine (AASM)      Larey Seat, M.D.   10-01-2016  Diplomat, American Board of Psychiatry and Neurology  Diplomat, [place] of Sleep Medicine Medical Director, [place] [facility] at [facility]

## 2016-10-04 NOTE — Telephone Encounter (Signed)
I have called this information to the patient and made her aware of the change that will need to be made to her CPAP machine. Pt is not able to tell me the company she uses for the to sent the order to. Pt stated she would call back with this information.

## 2016-10-07 DIAGNOSIS — I509 Heart failure, unspecified: Secondary | ICD-10-CM | POA: Diagnosis not present

## 2016-10-07 DIAGNOSIS — M810 Age-related osteoporosis without current pathological fracture: Secondary | ICD-10-CM | POA: Diagnosis not present

## 2016-10-07 DIAGNOSIS — E039 Hypothyroidism, unspecified: Secondary | ICD-10-CM | POA: Diagnosis not present

## 2016-10-07 DIAGNOSIS — E876 Hypokalemia: Secondary | ICD-10-CM | POA: Diagnosis not present

## 2016-10-07 DIAGNOSIS — R3915 Urgency of urination: Secondary | ICD-10-CM | POA: Diagnosis not present

## 2016-10-07 DIAGNOSIS — N390 Urinary tract infection, site not specified: Secondary | ICD-10-CM | POA: Diagnosis not present

## 2016-10-07 DIAGNOSIS — I10 Essential (primary) hypertension: Secondary | ICD-10-CM | POA: Diagnosis not present

## 2016-10-07 DIAGNOSIS — Z23 Encounter for immunization: Secondary | ICD-10-CM | POA: Diagnosis not present

## 2016-10-07 DIAGNOSIS — R112 Nausea with vomiting, unspecified: Secondary | ICD-10-CM | POA: Diagnosis not present

## 2016-10-07 DIAGNOSIS — R339 Retention of urine, unspecified: Secondary | ICD-10-CM | POA: Diagnosis not present

## 2016-10-12 DIAGNOSIS — F1190 Opioid use, unspecified, uncomplicated: Secondary | ICD-10-CM | POA: Insufficient documentation

## 2016-10-12 DIAGNOSIS — R338 Other retention of urine: Secondary | ICD-10-CM

## 2016-10-12 DIAGNOSIS — M6289 Other specified disorders of muscle: Secondary | ICD-10-CM

## 2016-10-12 DIAGNOSIS — N9489 Other specified conditions associated with female genital organs and menstrual cycle: Secondary | ICD-10-CM | POA: Insufficient documentation

## 2016-10-12 DIAGNOSIS — N952 Postmenopausal atrophic vaginitis: Secondary | ICD-10-CM

## 2016-10-12 HISTORY — DX: Opioid use, unspecified, uncomplicated: F11.90

## 2016-10-12 HISTORY — DX: Other specified disorders of muscle: M62.89

## 2016-10-12 HISTORY — DX: Postmenopausal atrophic vaginitis: N95.2

## 2016-10-12 HISTORY — DX: Other retention of urine: R33.8

## 2016-10-28 ENCOUNTER — Telehealth: Payer: Self-pay | Admitting: Neurology

## 2016-10-28 ENCOUNTER — Other Ambulatory Visit: Payer: Self-pay | Admitting: Neurology

## 2016-10-28 MED ORDER — AMPHETAMINE-DEXTROAMPHETAMINE 10 MG PO TABS: 10.0000 mg | ORAL_TABLET | Freq: Every day | ORAL | 0 refills | Status: DC

## 2016-10-28 NOTE — Telephone Encounter (Signed)
Pt called she uses [employer] (f) [phone] (p) [phone]

## 2016-10-28 NOTE — Telephone Encounter (Signed)
Pt request refill for amphetamine-dextroamphetamine (ADDERALL) 10 MG tablet . Pt is aware clinic closes at noon tomorrow

## 2016-10-28 NOTE — Telephone Encounter (Signed)
The script was placed and will be at the front desk ready for pick up.

## 2016-11-02 DIAGNOSIS — H2513 Age-related nuclear cataract, bilateral: Secondary | ICD-10-CM | POA: Diagnosis not present

## 2016-11-02 DIAGNOSIS — H524 Presbyopia: Secondary | ICD-10-CM | POA: Diagnosis not present

## 2016-11-25 ENCOUNTER — Telehealth: Payer: Self-pay | Admitting: Neurology

## 2016-11-25 ENCOUNTER — Other Ambulatory Visit: Payer: Self-pay | Admitting: Neurology

## 2016-11-25 DIAGNOSIS — G4733 Obstructive sleep apnea (adult) (pediatric): Secondary | ICD-10-CM

## 2016-11-25 MED ORDER — AMPHETAMINE-DEXTROAMPHETAMINE 10 MG PO TABS: 10.0000 mg | ORAL_TABLET | Freq: Every day | ORAL | 0 refills | Status: DC

## 2016-11-25 NOTE — Telephone Encounter (Signed)
Script will be ready at the front desk for the patient and Dr Dohmeier has agreed to decreasing the CPAP pressure for the patient to a pressure of 10. I have ordered this and will send it to [employer] so that the change can be made.

## 2016-11-25 NOTE — Telephone Encounter (Signed)
Pt called she request refill for amphetamine-dextroamphetamine (ADDERALL) 10 MG tablet . Pt will pick up on Tuesday.  Pt also said the pressure on her CPAP was changed from 8 to 12, she has been unable to use her equipment. The pressure is so strong, she is wanting it to be decreased down, maybe not to 8 but a happy medium. Please call to discuss.

## 2017-01-06 ENCOUNTER — Other Ambulatory Visit: Payer: Self-pay | Admitting: Neurology

## 2017-01-06 ENCOUNTER — Telehealth: Payer: Self-pay | Admitting: Neurology

## 2017-01-06 MED ORDER — AMPHETAMINE-DEXTROAMPHETAMINE 10 MG PO TABS: 10.0000 mg | ORAL_TABLET | Freq: Every day | ORAL | 0 refills | Status: DC

## 2017-01-06 NOTE — Telephone Encounter (Signed)
Pt request refill for amphetamine-dextroamphetamine (ADDERALL) 10 MG tablet. Pt said she is out of the medication. Pt is aware the clinic closes at noon tomorrow and will reopen on 01/12/17

## 2017-01-06 NOTE — Telephone Encounter (Signed)
The script will be ready for the patient at the front this afternoon

## 2017-01-18 ENCOUNTER — Ambulatory Visit (INDEPENDENT_AMBULATORY_CARE_PROVIDER_SITE_OTHER): Payer: Medicare Other | Admitting: Adult Health

## 2017-01-18 ENCOUNTER — Encounter: Payer: Self-pay | Admitting: Adult Health

## 2017-01-18 VITALS — BP 142/80 | HR 76 | Wt 115.8 lb

## 2017-01-18 DIAGNOSIS — G4733 Obstructive sleep apnea (adult) (pediatric): Secondary | ICD-10-CM | POA: Diagnosis not present

## 2017-01-18 DIAGNOSIS — Z9989 Dependence on other enabling machines and devices: Secondary | ICD-10-CM

## 2017-01-18 DIAGNOSIS — G4710 Hypersomnia, unspecified: Secondary | ICD-10-CM

## 2017-01-18 NOTE — Patient Instructions (Signed)
Your Plan:  Continue using CPAP. Pressure change to 5-15 Mask refitting today Continue Adderall If your symptoms worsen or you develop new symptoms please let us know.   Thank you for coming to see [name] at [facility]. I hope we have been able to provide you high quality care today.  You may receive a patient satisfaction survey over the next few weeks. We would appreciate your feedback and comments so that we may continue to improve ourselves and the health of our patients.

## 2017-01-18 NOTE — Progress Notes (Signed)
PATIENT: Angela [name] DOB: [birth year]  REASON FOR VISIT: follow up HISTORY FROM: patient  HISTORY OF PRESENT ILLNESS: Today 01/18/17 Ms. Angela [name] is a 72 year old female with a history of hypersomnia and obstructive sleep apnea on CPAP.  She returns for follow-up.  She reports that she has not been using her CPAP machine.  She reports that she called her DME company to advise that she cannot tolerate the increase in pressure.  She reports that her pressure has never been readjusted.  She states that she was originally on 8 and it was increased to 12 and she cannot tolerate this.  She also states that she was changed to a full facemask but does not feel that she understands how to correctly use the mask.  For these reasons she has not been using her CPAP machine.  She continues to use Adderall.  She feels that it does work well for her.  She does note that the initial effect has slowly decreased but she reports that it is definitely better than not taking anything at all.  She returns today for evaluation.  REVIEW OF SYSTEMS: Out of a complete 14 system review of symptoms, the patient complains only of the following symptoms, and all other reviewed systems are negative.  Epworth sleepiness score 23  ALLERGIES: Allergies  Allergen Reactions  . Doxycycline Nausea And Vomiting and Other (See Comments)    "blisters on feet"  . Nsaids Other (See Comments)    "have pain flare ups"; 03/03/13: skin blisters with any IBU, acetaminophen, Aleve.    HOME MEDICATIONS: Outpatient Medications Prior to Visit  Medication Sig Dispense Refill  . amphetamine-dextroamphetamine (ADDERALL) 10 MG tablet Take 1-2 tablets (10-20 mg total) by mouth daily with breakfast. 60 tablet 0  . ARMOUR THYROID PO Take 45 mcg by mouth daily.     . buprenorphine (SUBUTEX) 8 MG SUBL Place 4 mg under the tongue 5 (five) times daily.    . Butalbital-APAP-Caff-Cod 50-300-40-30 MG CAPS As needed for headaches (Patient taking  differently: Take 1 capsule by mouth daily as needed (headaches). As needed for headaches) 15 capsule 5  . furosemide (LASIX) 20 MG tablet Take 20 mg by mouth daily as needed for fluid.   0  . lisinopril-hydrochlorothiazide (PRINZIDE,ZESTORETIC) 10-12.5 MG per tablet Take 1 tablet by mouth daily. 30 tablet 0  . traMADol (ULTRAM) 50 MG tablet TK 2 TS PO QID    . cholecalciferol (VITAMIN D) 1000 units tablet Take 1,000 Units by mouth daily.    . modafinil (PROVIGIL) 100 MG tablet Take 1 tablet (100 mg total) by mouth daily. (Patient not taking: Reported on 01/18/2017) 30 tablet 5  . Multiple Vitamin (MULTIVITAMIN) tablet Take 1 tablet by mouth daily.    . potassium chloride (K-DUR) 10 MEQ tablet Take 10 mEq by mouth daily.   4  . butalbital-acetaminophen-caffeine (FIORICET WITH CODEINE) 50-325-40-30 MG capsule Take one tablet as needed for headache.  Limit the Fioricet used to less than twice each week to avoid the medicine rebound headache.  8 capsule 5   No facility-administered medications prior to visit.     PAST MEDICAL HISTORY: Past Medical History:  Diagnosis Date  . Chronic pain   . Headache   . Hypertension   . Joint disorder   . 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 ABDOMINAL HYSTERECTOMY     Age 33  . ANKLE SURGERY  x3  . DILATION AND CURETTAGE OF UTERUS     x 4  . KNEE SURGERY     x 3  . MANDIBLE FRACTURE SURGERY     x 19    FAMILY HISTORY: Family History  Problem Relation Age of Onset  . Breast cancer Mother   . Rheum arthritis Mother   . Aneurysm Father   . Heart disease Father   . Throat cancer Father   . Melanoma Father   . Heart disease Brother   . Heart disease Brother   . Thyroid disease Brother   . Post-traumatic stress disorder Brother   . Rheum arthritis Brother   . Polymyositis Brother   . Obesity Brother     SOCIAL HISTORY: Social History   Socioeconomic History  . Marital  status: Single    Spouse name: Not on file  . Number of children: 1  . Years of education: Bachelors  . Highest education level: Not on file  Social Needs  . Financial resource strain: Not on file  . Food insecurity - worry: Not on file  . Food insecurity - inability: Not on file  . Transportation needs - medical: Not on file  . Transportation needs - non-medical: Not on file  Occupational History  . Occupation: Part-time Secondary school teacher  Tobacco Use  . Smoking status: Former Research scientist (life sciences)  . Smokeless tobacco: Never Used  . Tobacco comment: Quit January 1980  Substance and Sexual Activity  . Alcohol use: Yes    Comment: occasional  . Drug use: No  . Sexual activity: Not on file  Other Topics Concern  . Not on file  Social History Narrative   Lives at home alone.   Right-handed.   2 cups caffeine per day.      PHYSICAL EXAM  Vitals:   01/18/17 0912  BP: (!) 142/80  Pulse: 76  Weight: 115 lb 12.8 oz (52.5 kg)   Body mass index is 19.27 kg/m.  Generalized: Well developed, in no acute distress   Neurological examination  Mentation: Alert oriented to time, place, history taking. Follows all commands speech and language fluent Cranial nerve II-XII: Pupils were equal round reactive to light. Extraocular movements were full, visual field were full on confrontational test. Facial sensation and strength were normal. Uvula tongue midline. Head turning and shoulder shrug  were normal and symmetric. Motor: The motor testing reveals 5 over 5 strength of all 4 extremities. Good symmetric motor tone is noted throughout.  Sensory: Sensory testing is intact to soft touch on all 4 extremities. No evidence of extinction is noted.  Coordination: Cerebellar testing reveals good finger-nose-finger and heel-to-shin bilaterally.  Gait and station: Gait is normal.    DIAGNOSTIC DATA (LABS, IMAGING, TESTING) - I reviewed patient records, labs, notes, testing and imaging myself where  available.  Lab Results  Component Value Date   WBC 5.3 03/05/2016   HGB 11.1 (L) 03/05/2016   HCT 33.8 (L) 03/05/2016   MCV 98.0 03/05/2016   PLT 215 03/05/2016      Component Value Date/Time   NA 139 03/05/2016 1325   K 3.7 03/05/2016 1325   CL 104 03/05/2016 1325   CO2 27 03/05/2016 1325   GLUCOSE 85 03/05/2016 1325   BUN 10 03/05/2016 1325   CREATININE 0.54 03/05/2016 1325   CALCIUM 8.6 (L) 03/05/2016 1325   PROT 5.4 (L) 03/05/2016 1325   ALBUMIN 3.3 (L) 03/05/2016 1325   AST 24 03/05/2016 1325   ALT 15 03/05/2016  1325   ALKPHOS 49 03/05/2016 1325   BILITOT 0.7 03/05/2016 1325   GFRNONAA >60 03/05/2016 1325   GFRAA >60 03/05/2016 1325      ASSESSMENT AND PLAN 72 y.o. 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 here with:  1.  Obstructive sleep apnea on CPAP 2.  Hypersomnia  I will change the patient's settings to AutoSet 5-15 to see if she can better tolerate the settings.  She will continue on Adderall 10 mg 1-2 tablets daily for hypersomnia.  She will also have a mask refitting today with our sleep lab.  She is advised that if her symptoms worsen or she develops new symptoms she should let us know.   I spent 15 minutes with the patient. 50% of this time was spent reviewing her CPAP and medication.  Ward Givens, MSN, NP-C 01/18/2017, 9:18 AM [facility] [address] [place], [place] [address] [phone]

## 2017-02-14 ENCOUNTER — Telehealth: Payer: Self-pay | Admitting: Adult Health

## 2017-02-14 MED ORDER — AMPHETAMINE-DEXTROAMPHETAMINE 10 MG PO TABS: 10.0000 mg | ORAL_TABLET | Freq: Every day | ORAL | 0 refills | Status: DC

## 2017-02-14 NOTE — Telephone Encounter (Signed)
Pt request refill for amphetamine-dextroamphetamine (ADDERALL) 10 MG tablet °

## 2017-02-15 NOTE — Telephone Encounter (Addendum)
Prescription placed up front for pt to pick up by Gerline Legacy, RN.  :[facility] for pt that prescription ready for pick up.

## 2017-03-16 ENCOUNTER — Telehealth: Payer: Self-pay | Admitting: Adult Health

## 2017-03-16 MED ORDER — AMPHETAMINE-DEXTROAMPHETAMINE 10 MG PO TABS
10.0000 mg | ORAL_TABLET | Freq: Every day | ORAL | 0 refills | Status: DC
Start: 2017-03-16 — End: 2017-04-18

## 2017-03-16 NOTE — Telephone Encounter (Signed)
Pt requesting a refill for amphetamine-dextroamphetamine (ADDERALL) 10 MG tablet

## 2017-03-16 NOTE — Telephone Encounter (Signed)
Adderall refill Rx on M Millikan NP's desk for signature.  Adderall refill Rx placed at front desk for pick up.

## 2017-04-06 DIAGNOSIS — E538 Deficiency of other specified B group vitamins: Secondary | ICD-10-CM | POA: Diagnosis not present

## 2017-04-06 DIAGNOSIS — L089 Local infection of the skin and subcutaneous tissue, unspecified: Secondary | ICD-10-CM | POA: Diagnosis not present

## 2017-04-06 DIAGNOSIS — I129 Hypertensive chronic kidney disease with stage 1 through stage 4 chronic kidney disease, or unspecified chronic kidney disease: Secondary | ICD-10-CM | POA: Diagnosis not present

## 2017-04-06 DIAGNOSIS — I509 Heart failure, unspecified: Secondary | ICD-10-CM | POA: Diagnosis not present

## 2017-04-06 DIAGNOSIS — E039 Hypothyroidism, unspecified: Secondary | ICD-10-CM | POA: Diagnosis not present

## 2017-04-06 DIAGNOSIS — M25569 Pain in unspecified knee: Secondary | ICD-10-CM | POA: Diagnosis not present

## 2017-04-06 DIAGNOSIS — M810 Age-related osteoporosis without current pathological fracture: Secondary | ICD-10-CM | POA: Diagnosis not present

## 2017-04-06 DIAGNOSIS — E559 Vitamin D deficiency, unspecified: Secondary | ICD-10-CM | POA: Diagnosis not present

## 2017-04-06 DIAGNOSIS — I10 Essential (primary) hypertension: Secondary | ICD-10-CM | POA: Diagnosis not present

## 2017-04-11 DIAGNOSIS — R7989 Other specified abnormal findings of blood chemistry: Secondary | ICD-10-CM | POA: Diagnosis not present

## 2017-04-11 DIAGNOSIS — Z9989 Dependence on other enabling machines and devices: Secondary | ICD-10-CM | POA: Diagnosis not present

## 2017-04-11 DIAGNOSIS — Z0000 Encounter for general adult medical examination without abnormal findings: Secondary | ICD-10-CM | POA: Diagnosis not present

## 2017-04-11 DIAGNOSIS — G4733 Obstructive sleep apnea (adult) (pediatric): Secondary | ICD-10-CM | POA: Diagnosis not present

## 2017-04-11 DIAGNOSIS — M810 Age-related osteoporosis without current pathological fracture: Secondary | ICD-10-CM | POA: Diagnosis not present

## 2017-04-11 DIAGNOSIS — E039 Hypothyroidism, unspecified: Secondary | ICD-10-CM | POA: Diagnosis not present

## 2017-04-11 DIAGNOSIS — I10 Essential (primary) hypertension: Secondary | ICD-10-CM | POA: Diagnosis not present

## 2017-04-12 DIAGNOSIS — M810 Age-related osteoporosis without current pathological fracture: Secondary | ICD-10-CM | POA: Diagnosis not present

## 2017-04-18 ENCOUNTER — Other Ambulatory Visit: Payer: Self-pay | Admitting: Adult Health

## 2017-04-18 NOTE — Telephone Encounter (Signed)
Pt request refill for amphetamine-dextroamphetamine (ADDERALL) 10 MG tablet °

## 2017-04-20 MED ORDER — AMPHETAMINE-DEXTROAMPHETAMINE 10 MG PO TABS: 10.0000 mg | ORAL_TABLET | Freq: Every day | ORAL | 0 refills | Status: DC

## 2017-04-20 NOTE — Telephone Encounter (Signed)
Angela [name] drug registry checked.

## 2017-05-03 DIAGNOSIS — S80862A Insect bite (nonvenomous), left lower leg, initial encounter: Secondary | ICD-10-CM | POA: Diagnosis not present

## 2017-05-03 DIAGNOSIS — W57XXXA Bitten or stung by nonvenomous insect and other nonvenomous arthropods, initial encounter: Secondary | ICD-10-CM | POA: Diagnosis not present

## 2017-05-17 ENCOUNTER — Ambulatory Visit (INDEPENDENT_AMBULATORY_CARE_PROVIDER_SITE_OTHER): Payer: Medicare Other | Admitting: Orthopaedic Surgery

## 2017-05-18 ENCOUNTER — Encounter: Payer: Self-pay | Admitting: Adult Health

## 2017-05-19 ENCOUNTER — Ambulatory Visit (INDEPENDENT_AMBULATORY_CARE_PROVIDER_SITE_OTHER): Payer: Medicare Other | Admitting: Adult Health

## 2017-05-19 ENCOUNTER — Encounter: Payer: Self-pay | Admitting: Adult Health

## 2017-05-19 VITALS — BP 97/67 | HR 82 | Ht 65.5 in | Wt 114.0 lb

## 2017-05-19 DIAGNOSIS — G4710 Hypersomnia, unspecified: Secondary | ICD-10-CM | POA: Diagnosis not present

## 2017-05-19 DIAGNOSIS — Z9989 Dependence on other enabling machines and devices: Secondary | ICD-10-CM | POA: Diagnosis not present

## 2017-05-19 DIAGNOSIS — G4733 Obstructive sleep apnea (adult) (pediatric): Secondary | ICD-10-CM

## 2017-05-19 MED ORDER — AMPHETAMINE-DEXTROAMPHETAMINE 10 MG PO TABS: 10.0000 mg | ORAL_TABLET | Freq: Every day | ORAL | 0 refills | Status: DC

## 2017-05-19 NOTE — Progress Notes (Signed)
PATIENT: Angela [name] DOB: 1945-12-22  REASON FOR VISIT: follow up HISTORY FROM: patient  HISTORY OF PRESENT ILLNESS: Today 05/19/17 Ms. Angela [name] is a 72 year old female with a history of hypersomnia and obstructive sleep apnea on CPAP.  She returns today for an evaluation.  At the last visit we adjusted her CPAP pressures.  Her download today indicates that she used her machine 28 out of 30 days for compliance of 93%.  She uses her machine greater than 4 hours 18 out of 30 days for compliance of 60%.  On average she uses her machine 4 hours and 29 minutes.  She is on a minimum pressure of 5 cm of water and maximum pressure of 15 cm of water with EPR of 1.  Her residual AHI is 1.4 she does have a significant leak in the 95th percentile at 35.9 L/min.  She reports that since we adjusted her pressure she has found it more comfortable to use the CPAP machine.  She does note that she needs to change out her supplies as her straps have gotten loose.  She reports that Adderall continues to be beneficial for daytime sleepiness.  She returns today for an evaluation.  HISTORY 01/18/17 Ms. Angela [name] is a 72 year old female with a history of hypersomnia and obstructive sleep apnea on CPAP.  She returns for follow-up.  She reports that she has not been using her CPAP machine.  She reports that she called her DME company to advise that she cannot tolerate the increase in pressure.  She reports that her pressure has never been readjusted.  She states that she was originally on 8 and it was increased to 12 and she cannot tolerate this.  She also states that she was changed to a full facemask but does not feel that she understands how to correctly use the mask.  For these reasons she has not been using her CPAP machine.  She continues to use Adderall.  She feels that it does work well for her.  She does note that the initial effect has slowly decreased but she reports that it is definitely better than not taking  anything at all.  She returns today for evaluation.    REVIEW OF SYSTEMS: Out of a complete 14 system review of symptoms, the patient complains only of the following symptoms, and all other reviewed systems are negative.  See HPI  ALLERGIES: Allergies  Allergen Reactions  . Nsaids Other (See Comments)    "have pain flare ups"; 03/03/13: skin blisters with any IBU, acetaminophen, Aleve.  Any anti-inflammatory  . Doxycycline Nausea And Vomiting and Other (See Comments)    "blisters on feet"    HOME MEDICATIONS: Outpatient Medications Prior to Visit  Medication Sig Dispense Refill  . amphetamine-dextroamphetamine (ADDERALL) 10 MG tablet Take 1-2 tablets (10-20 mg total) by mouth daily with breakfast. 60 tablet 0  . ARMOUR THYROID PO Take 45 mcg by mouth daily.     . buprenorphine (SUBUTEX) 8 MG SUBL Place 4 mg under the tongue 3 (three) times daily.     . cholecalciferol (VITAMIN D) 1000 units tablet Take 1,000 Units by mouth daily.    . furosemide (LASIX) 20 MG tablet Take 20 mg by mouth daily as needed for fluid.   0  . lisinopril-hydrochlorothiazide (PRINZIDE,ZESTORETIC) 10-12.5 MG per tablet Take 1 tablet by mouth daily. 30 tablet 0  . Multiple Vitamin (MULTIVITAMIN) tablet Take 1 tablet by mouth daily.    . traMADol (ULTRAM) 50 MG tablet  TK 2 TS PO QID    . potassium chloride (K-DUR) 10 MEQ tablet Take 10 mEq by mouth daily.   4  . Butalbital-APAP-Caff-Cod 50-300-40-30 MG CAPS As needed for headaches (Patient taking differently: Take 1 capsule by mouth daily as needed (headaches). As needed for headaches) 15 capsule 5  . modafinil (PROVIGIL) 100 MG tablet Take 1 tablet (100 mg total) by mouth daily. (Patient not taking: Reported on 01/18/2017) 30 tablet 5   No facility-administered medications prior to visit.     PAST MEDICAL HISTORY: Past Medical History:  Diagnosis Date  . Chronic pain   . Headache   . Hypertension   . Joint disorder   . 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 ABDOMINAL HYSTERECTOMY     Age 28  . ANKLE SURGERY     x3  . DILATION AND CURETTAGE OF UTERUS     x 4  . KNEE SURGERY     x 3  . MANDIBLE FRACTURE SURGERY     x 19    FAMILY HISTORY: Family History  Problem Relation Age of Onset  . Breast cancer Mother   . Rheum arthritis Mother   . Aneurysm Father   . Heart disease Father   . Throat cancer Father   . Melanoma Father   . Heart disease Brother   . Heart disease Brother   . Thyroid disease Brother   . Post-traumatic stress disorder Brother   . Rheum arthritis Brother   . Polymyositis Brother   . Obesity Brother     SOCIAL HISTORY: Social History   Socioeconomic History  . Marital status: Single    Spouse name: Not on file  . Number of children: 1  . Years of education: Bachelors  . Highest education level: Not on file  Occupational History  . Occupation: Part-time Secondary school teacher  Social Needs  . Financial resource strain: Not on file  . Food insecurity:    Worry: Not on file    Inability: Not on file  . Transportation needs:    Medical: Not on file    Non-medical: Not on file  Tobacco Use  . Smoking status: Former Research scientist (life sciences)  . Smokeless tobacco: Never Used  . Tobacco comment: Quit January 1980  Substance and Sexual Activity  . Alcohol use: Yes    Comment: occasional  . Drug use: No  . Sexual activity: Not on file  Lifestyle  . Physical activity:    Days per week: Not on file    Minutes per session: Not on file  . Stress: Not on file  Relationships  . Social connections:    Talks on phone: Not on file    Gets together: Not on file    Attends religious service: Not on file    Active member of club or organization: Not on file    Attends meetings of clubs or organizations: Not on file    Relationship status: Not on file  . Intimate partner violence:    Fear of current or ex partner: Not on file    Emotionally abused: Not on  file    Physically abused: Not on file    Forced sexual activity: Not on file  Other Topics Concern  . Not on file  Social History Narrative   Lives at home alone.   Right-handed.   2 cups caffeine per day.      PHYSICAL EXAM  Vitals:   05/19/17 0831  BP: 97/67  Pulse: 82  Weight: 114 lb (51.7 kg)  Height: 5' 5.5" (1.664 m)   Body mass index is 18.68 kg/m.  Generalized: Well developed, in no acute distress   Neurological examination  Mentation: Alert oriented to time, place, history taking. Follows all commands speech and language fluent Cranial nerve II-XII: Pupils were equal round reactive to light. Extraocular movements were full, visual field were full on confrontational test. Facial sensation and strength were normal. Uvula tongue midline. Head turning and shoulder shrug  were normal and symmetric.  Neck circumference 15 inches, Mallampati 4+ Motor: The motor testing reveals 5 over 5 strength of all 4 extremities. Good symmetric motor tone is noted throughout.  Sensory: Sensory testing is intact to soft touch on all 4 extremities. No evidence of extinction is noted.  Coordination: Cerebellar testing reveals good finger-nose-finger and heel-to-shin bilaterally.  Gait and station: Gait is normal.  Reflexes: Deep tendon reflexes are symmetric and normal bilaterally.   DIAGNOSTIC DATA (LABS, IMAGING, TESTING) - I reviewed patient records, labs, notes, testing and imaging myself where available.  Lab Results  Component Value Date   WBC 5.3 03/05/2016   HGB 11.1 (L) 03/05/2016   HCT 33.8 (L) 03/05/2016   MCV 98.0 03/05/2016   PLT 215 03/05/2016      Component Value Date/Time   NA 139 03/05/2016 1325   K 3.7 03/05/2016 1325   CL 104 03/05/2016 1325   CO2 27 03/05/2016 1325   GLUCOSE 85 03/05/2016 1325   BUN 10 03/05/2016 1325   CREATININE 0.54 03/05/2016 1325   CALCIUM 8.6 (L) 03/05/2016 1325   PROT 5.4 (L) 03/05/2016 1325   ALBUMIN 3.3 (L) 03/05/2016 1325    AST 24 03/05/2016 1325   ALT 15 03/05/2016 1325   ALKPHOS 49 03/05/2016 1325   BILITOT 0.7 03/05/2016 1325   GFRNONAA >60 03/05/2016 1325   GFRAA >60 03/05/2016 1325      ASSESSMENT AND PLAN 72 y.o. 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 here with :  1.  Obstructive sleep apnea on CPAP 2.  Hypersomnia  The patient CPAP download shows better compliance.  She is encouraged to use machine greater than 4 hours each night.  She is also encouraged to change out her supplies regularly.  She will continue on Adderall.  I have advised that if her symptoms worsen or she develops new symptoms she should let us know.  She will follow-up in 6 months or sooner if needed.    Ward Givens, MSN, NP-C 05/19/2017, 8:38 AM [facility] [address], [place], [place] [address] [phone]

## 2017-05-19 NOTE — Patient Instructions (Signed)
Your Plan:  Continue using CPAP nightly and >4 hours each night Change out supplies Continue Adderall If your symptoms worsen or you develop new symptoms please let us know.    Thank you for coming to see [name] at [facility]. I hope we have been able to provide you high quality care today.  You may receive a patient satisfaction survey over the next few weeks. We would appreciate your feedback and comments so that we may continue to improve ourselves and the health of our patients.

## 2017-05-31 ENCOUNTER — Ambulatory Visit (INDEPENDENT_AMBULATORY_CARE_PROVIDER_SITE_OTHER): Payer: Medicare Other | Admitting: Orthopaedic Surgery

## 2017-06-03 NOTE — Progress Notes (Signed)
I agree with the assessment and plan as directed by NP .The patient is known to me .   Anetta Olvera, MD  

## 2017-06-14 ENCOUNTER — Ambulatory Visit (INDEPENDENT_AMBULATORY_CARE_PROVIDER_SITE_OTHER): Payer: Medicare Other

## 2017-06-14 ENCOUNTER — Encounter (INDEPENDENT_AMBULATORY_CARE_PROVIDER_SITE_OTHER): Payer: Self-pay | Admitting: Orthopaedic Surgery

## 2017-06-14 ENCOUNTER — Ambulatory Visit (INDEPENDENT_AMBULATORY_CARE_PROVIDER_SITE_OTHER): Payer: Medicare Other | Admitting: Orthopaedic Surgery

## 2017-06-14 DIAGNOSIS — M79605 Pain in left leg: Secondary | ICD-10-CM

## 2017-06-14 DIAGNOSIS — M7062 Trochanteric bursitis, left hip: Secondary | ICD-10-CM

## 2017-06-14 MED ORDER — LIDOCAINE HCL 1 % IJ SOLN
3.0000 mL | INTRAMUSCULAR | Status: AC | PRN
  Administered 2017-06-14: 3 mL

## 2017-06-14 MED ORDER — METHYLPREDNISOLONE ACETATE 40 MG/ML IJ SUSP
40.0000 mg | INTRAMUSCULAR | Status: AC | PRN
  Administered 2017-06-14: 40 mg via INTRA_ARTICULAR

## 2017-06-14 NOTE — Progress Notes (Signed)
Office Visit Note   Patient: Angela [name]           Date of Birth: 1945-07-10           MRN: [id number] Visit Date: 06/14/2017              Requested by: Thressa Sheller, [place], [place], [place] [address] PCP: Thressa Sheller, MD   Assessment & Plan: Visit Diagnoses:  1. Pain in left leg   2. Trochanteric bursitis, left hip     Plan: We will have her reduce IT band stretching exercises.  We will see her back in 2 weeks check her response to the shot and stretching exercises.  She continues to have right thigh pain consider MRI to rule out soft tissue mass.. Questions encouraged and answered by Dr. Ninfa Linden and myself.  Follow-Up Instructions: Return in about 2 weeks (around 06/28/2017).   Orders:  Orders Placed This Encounter  Procedures  . Large Joint Inj  . XR Lumbar Spine 2-3 Views  . XR FEMUR MIN 2 VIEWS LEFT   No orders of the defined types were placed in this encounter.     Procedures: Large Joint Inj: L greater trochanter on 06/14/2017 9:28 AM Indications: pain Details: 22 G 1.5 in needle, lateral approach  Arthrogram: No  Medications: 3 mL lidocaine 1 %; 40 mg methylPREDNISolone acetate 40 MG/ML Outcome: tolerated well, no immediate complications Procedure, treatment alternatives, risks and benefits explained, specific risks discussed. Consent was given by the patient. Immediately prior to procedure a time out was called to verify the correct patient, procedure, equipment, support staff and site/side marked as required. Patient was prepped and draped in the usual sterile fashion.       Clinical Data: No additional findings.   Subjective: Chief Complaint  Patient presents with  . Left Leg - Pain    HPI Angela [name] comes in today due to left leg pain.  She states she has had some thigh pain for several months now.  Pain is getting progressively worse.  Said no known injury she does hike about 10 or 12 miles several times a  week.  She denies any back pain.  She does have some radicular symptoms down to the ankle at times and occasionally down into the foot dorsal aspect.  She describes it as a electrical pain but no numbness or tingling.  She has some low back pain with certain tasks like vacuuming.  States that walking does not make her pain worsens whenever she is sitting still.  She has no waking pain.  She feels that her left femur is definitely swollen and that there is something going on with the femur or the soft tissue about the femur. Review of Systems Denies fevers , chills, SOB, chest pain, nausea or vomitting.   Objective: Vital Signs: There were no vitals taken for this visit.  Physical Exam  Constitutional: She is oriented to person, place, and time. She appears well-developed and well-nourished. No distress.  Pulmonary/Chest: Effort normal.  Neurological: She is alert and oriented to person, place, and time.  Skin: She is not diaphoretic.  Psychiatric: She has a normal mood and affect.    Ortho Exam Lumbar spine she has no tenderness over the lumbar spine or paraspinous region.  She has full extension flexion of the lumbar spine without pain.  5 out of 5 strength throughout lower extremities against resistance.  Good range of motion of bilateral hips without pain.  Left hip some tenderness over the trochanteric region only.  Dorsal pedal pulses are 2+ bilaterally sensation grossly intact bilateral feet. Specialty Comments:  No specialty comments available.  Imaging: No results found.   PMFS History: Patient Active Problem List   Diagnosis Date Noted  . New onset headache 01/19/2016  . Excessive sleepiness 01/19/2016  . HYPERTENSION, BENIGN 03/10/2009  . HYPERTENSION, UNCONTROLLED 01/29/2009  . ARM PAIN, LEFT 01/29/2009   Past Medical History:  Diagnosis Date  . Chronic pain   . Headache   . Hypertension   . Joint disorder   . 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 Breast cancer Mother   . Rheum arthritis Mother   . Aneurysm Father   . Heart disease Father   . Throat cancer Father   . Melanoma Father   . Heart disease Brother   . Heart disease Brother   . Thyroid disease Brother   . Post-traumatic stress disorder Brother   . Rheum arthritis Brother   . Polymyositis Brother   . Obesity Brother     Past Surgical History:  Procedure Laterality Date  . ABDOMINAL HYSTERECTOMY     Age 63  . ANKLE SURGERY     x3  . DILATION AND CURETTAGE OF UTERUS     x 4  . KNEE SURGERY     x 3  . MANDIBLE FRACTURE SURGERY     x 19   Social History   Occupational History  . Occupation: Part-time Secondary school teacher  Tobacco Use  . Smoking status: Former Research scientist (life sciences)  . Smokeless tobacco: Never Used  . Tobacco comment: Quit January 1980  Substance and Sexual Activity  . Alcohol use: Yes    Comment: occasional  . Drug use: No  . Sexual activity: Not on file

## 2017-06-27 ENCOUNTER — Other Ambulatory Visit: Payer: Self-pay | Admitting: Adult Health

## 2017-06-27 MED ORDER — AMPHETAMINE-DEXTROAMPHETAMINE 10 MG PO TABS: 10.0000 mg | ORAL_TABLET | Freq: Every day | ORAL | 0 refills | Status: DC

## 2017-06-27 NOTE — Telephone Encounter (Signed)
Pt request refill for amphetamine-dextroamphetamine (ADDERALL) 10 MG tablet sent to [facility]. Pt said she is out of the medication.

## 2017-06-27 NOTE — Telephone Encounter (Signed)
I check drug registry Adderall 10mg  tabs and would not give me results on this pt.  I called [facility] [phone] and spoke to female pharmacist and he relayed that pt has been getting from them 01/12/2017, 02/18/17, 03/22/17, 4/11-19, 05/19/17 #60.

## 2017-06-28 ENCOUNTER — Encounter (INDEPENDENT_AMBULATORY_CARE_PROVIDER_SITE_OTHER): Payer: Self-pay | Admitting: Orthopaedic Surgery

## 2017-06-28 ENCOUNTER — Ambulatory Visit (INDEPENDENT_AMBULATORY_CARE_PROVIDER_SITE_OTHER): Payer: Medicare Other | Admitting: Orthopaedic Surgery

## 2017-06-28 DIAGNOSIS — M7062 Trochanteric bursitis, left hip: Secondary | ICD-10-CM

## 2017-06-28 HISTORY — DX: Trochanteric bursitis, left hip: M70.62

## 2017-06-28 NOTE — Progress Notes (Signed)
The patient is following up for treatment of left hip trochanteric bursitis.  She said steroid injection kicked in after few days and helped greatly.  She still experiencing some left hip pain but not to the degree that she had before.  On exam I can easily put her left hip to internal extra rotation with little discomfort.  She has touch of pain to palpation of the trochanteric area.  At this point I do feel that she would benefit from outpatient physical therapy and I gave her prescription for this.  We can always see her back in 2 months for repeat injection if needed.

## 2017-07-27 ENCOUNTER — Other Ambulatory Visit: Payer: Self-pay | Admitting: Adult Health

## 2017-07-27 MED ORDER — AMPHETAMINE-DEXTROAMPHETAMINE 10 MG PO TABS: 10.0000 mg | ORAL_TABLET | Freq: Every day | ORAL | 0 refills | Status: DC

## 2017-07-27 NOTE — Telephone Encounter (Signed)
Pt request refill for amphetamine-dextroamphetamine (ADDERALL) 10 MG tablet sent to [facility]

## 2017-07-27 NOTE — Telephone Encounter (Signed)
[place] narcotic registry checked.  The patient is not getting stimulants from other providers.  She is getting tramadol 50mg , #240 per month and buprenorphine 8mg  from Dr. Rachael Fee (pain management).  Her last appt was on 05/19/17 with instructions to follow up in six months.  She has a pending appt on 06/27/17.  Her last Adderall prescription was filled on 06/27/17.

## 2017-08-26 ENCOUNTER — Other Ambulatory Visit: Payer: Self-pay | Admitting: Adult Health

## 2017-08-26 NOTE — Telephone Encounter (Signed)
Pt requesting refills for amphetamine-dextroamphetamine (ADDERALL) 10 MG tablet sent to [facility] °

## 2017-08-26 NOTE — Telephone Encounter (Signed)
Verneita Griffes RN checked registry. Last refilled 7/17 rx by Jinny Blossom. Will send to wid Dr. Jaynee Eagles for refill.

## 2017-08-29 ENCOUNTER — Ambulatory Visit (INDEPENDENT_AMBULATORY_CARE_PROVIDER_SITE_OTHER): Payer: Medicare Other | Admitting: Orthopaedic Surgery

## 2017-08-29 MED ORDER — AMPHETAMINE-DEXTROAMPHETAMINE 10 MG PO TABS: 10.0000 mg | ORAL_TABLET | Freq: Every day | ORAL | 0 refills | Status: DC

## 2017-08-29 NOTE — Addendum Note (Signed)
Addended by: Gildardo Griffes on: 08/29/2017 03:05 PM   Modules accepted: Orders

## 2017-09-13 ENCOUNTER — Ambulatory Visit (INDEPENDENT_AMBULATORY_CARE_PROVIDER_SITE_OTHER): Payer: Medicare Other | Admitting: Orthopaedic Surgery

## 2017-09-13 ENCOUNTER — Encounter (INDEPENDENT_AMBULATORY_CARE_PROVIDER_SITE_OTHER): Payer: Self-pay | Admitting: Orthopaedic Surgery

## 2017-09-13 DIAGNOSIS — M7062 Trochanteric bursitis, left hip: Secondary | ICD-10-CM

## 2017-09-13 NOTE — Progress Notes (Signed)
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  I last injected her in early June.  She says since that injection she has done much better overall and actually does not have any significant pain at this point.  On examination of her left hip it moves fluidly with no pain over the trochanteric area or the IT band.  There is no knee pain either.  Her range of motion is normal and her exam is normal.  She is been having some low back pain and that she points to the upper lumbar spine but in the paraspinal muscles on both sides.  This only affects when she is doing yard work and mopping or sweeping.  At this point since she is doing so well she will follow-up as needed.  All question concerns were answered and addressed.  I talked her about exercises and things she can do to help with her back.

## 2017-09-27 ENCOUNTER — Telehealth: Payer: Self-pay | Admitting: Adult Health

## 2017-09-27 NOTE — Telephone Encounter (Signed)
Pt requesting refills for amphetamine-dextroamphetamine (ADDERALL) 10 MG tablet sent to [facility] °

## 2017-09-28 MED ORDER — AMPHETAMINE-DEXTROAMPHETAMINE 10 MG PO TABS: 10.0000 mg | ORAL_TABLET | Freq: Every day | ORAL | 0 refills | Status: DC

## 2017-09-28 NOTE — Addendum Note (Signed)
Addended by: Trudie Buckler on: 09/28/2017 04:25 PM   Modules accepted: Orders

## 2017-10-06 DIAGNOSIS — Z803 Family history of malignant neoplasm of breast: Secondary | ICD-10-CM | POA: Diagnosis not present

## 2017-10-06 DIAGNOSIS — Z1231 Encounter for screening mammogram for malignant neoplasm of breast: Secondary | ICD-10-CM | POA: Diagnosis not present

## 2017-10-27 ENCOUNTER — Telehealth: Payer: Self-pay | Admitting: Adult Health

## 2017-10-27 MED ORDER — AMPHETAMINE-DEXTROAMPHETAMINE 10 MG PO TABS: 10.0000 mg | ORAL_TABLET | Freq: Every day | ORAL | 0 refills | Status: DC

## 2017-10-27 NOTE — Telephone Encounter (Signed)
Pt requesting refills for amphetamine-dextroamphetamine (ADDERALL) 10 MG tablet sent to [facility] °

## 2017-11-14 DIAGNOSIS — I10 Essential (primary) hypertension: Secondary | ICD-10-CM | POA: Diagnosis not present

## 2017-11-14 DIAGNOSIS — R5383 Other fatigue: Secondary | ICD-10-CM | POA: Diagnosis not present

## 2017-11-14 DIAGNOSIS — E039 Hypothyroidism, unspecified: Secondary | ICD-10-CM | POA: Diagnosis not present

## 2017-11-14 DIAGNOSIS — Z23 Encounter for immunization: Secondary | ICD-10-CM | POA: Diagnosis not present

## 2017-11-21 ENCOUNTER — Ambulatory Visit: Payer: Medicare Other | Admitting: Adult Health

## 2017-11-23 DIAGNOSIS — R002 Palpitations: Secondary | ICD-10-CM | POA: Diagnosis not present

## 2017-11-23 DIAGNOSIS — I509 Heart failure, unspecified: Secondary | ICD-10-CM | POA: Diagnosis not present

## 2017-11-23 DIAGNOSIS — I10 Essential (primary) hypertension: Secondary | ICD-10-CM | POA: Diagnosis not present

## 2017-11-23 DIAGNOSIS — R000 Tachycardia, unspecified: Secondary | ICD-10-CM | POA: Diagnosis not present

## 2017-11-29 ENCOUNTER — Other Ambulatory Visit: Payer: Self-pay | Admitting: Adult Health

## 2017-11-29 MED ORDER — AMPHETAMINE-DEXTROAMPHETAMINE 10 MG PO TABS: 10.0000 mg | ORAL_TABLET | Freq: Every day | ORAL | 0 refills | Status: DC

## 2017-11-29 NOTE — Telephone Encounter (Signed)
Patient requesting refill of amphetamine-dextroamphetamine (ADDERALL) 10 MG tablet sent to [facility] on [address].  °  ° °

## 2017-11-29 NOTE — Addendum Note (Signed)
Addended by: Florian Buff C on: 11/29/2017 10:02 AM   Modules accepted: Orders

## 2017-12-13 DIAGNOSIS — R002 Palpitations: Secondary | ICD-10-CM | POA: Diagnosis not present

## 2017-12-14 DIAGNOSIS — I10 Essential (primary) hypertension: Secondary | ICD-10-CM | POA: Diagnosis not present

## 2017-12-14 DIAGNOSIS — R002 Palpitations: Secondary | ICD-10-CM | POA: Diagnosis not present

## 2017-12-14 DIAGNOSIS — Z0189 Encounter for other specified special examinations: Secondary | ICD-10-CM | POA: Diagnosis not present

## 2017-12-14 DIAGNOSIS — R9431 Abnormal electrocardiogram [ECG] [EKG]: Secondary | ICD-10-CM | POA: Diagnosis not present

## 2017-12-22 DIAGNOSIS — R002 Palpitations: Secondary | ICD-10-CM | POA: Diagnosis not present

## 2017-12-23 DIAGNOSIS — C44729 Squamous cell carcinoma of skin of left lower limb, including hip: Secondary | ICD-10-CM | POA: Diagnosis not present

## 2017-12-23 DIAGNOSIS — Z85828 Personal history of other malignant neoplasm of skin: Secondary | ICD-10-CM | POA: Diagnosis not present

## 2017-12-23 DIAGNOSIS — D485 Neoplasm of uncertain behavior of skin: Secondary | ICD-10-CM | POA: Diagnosis not present

## 2017-12-26 DIAGNOSIS — R9431 Abnormal electrocardiogram [ECG] [EKG]: Secondary | ICD-10-CM | POA: Diagnosis not present

## 2017-12-26 DIAGNOSIS — R002 Palpitations: Secondary | ICD-10-CM | POA: Diagnosis not present

## 2017-12-27 DIAGNOSIS — H16201 Unspecified keratoconjunctivitis, right eye: Secondary | ICD-10-CM | POA: Diagnosis not present

## 2017-12-30 ENCOUNTER — Other Ambulatory Visit: Payer: Self-pay | Admitting: Adult Health

## 2017-12-30 MED ORDER — AMPHETAMINE-DEXTROAMPHETAMINE 10 MG PO TABS
10.0000 mg | ORAL_TABLET | Freq: Every day | ORAL | 0 refills | Status: DC
Start: 2017-12-30 — End: 2018-02-01

## 2017-12-30 NOTE — Telephone Encounter (Signed)
Pt requesting refills for   amphetamine-dextroamphetamine (ADDERALL) 10 MG tablet  Sent to [facility]

## 2018-01-06 DIAGNOSIS — R002 Palpitations: Secondary | ICD-10-CM | POA: Diagnosis not present

## 2018-01-06 DIAGNOSIS — R9431 Abnormal electrocardiogram [ECG] [EKG]: Secondary | ICD-10-CM | POA: Diagnosis not present

## 2018-01-10 ENCOUNTER — Encounter: Payer: Self-pay | Admitting: Adult Health

## 2018-01-10 DIAGNOSIS — C44729 Squamous cell carcinoma of skin of left lower limb, including hip: Secondary | ICD-10-CM | POA: Diagnosis not present

## 2018-01-10 DIAGNOSIS — Z85828 Personal history of other malignant neoplasm of skin: Secondary | ICD-10-CM | POA: Diagnosis not present

## 2018-01-17 ENCOUNTER — Encounter: Payer: Self-pay | Admitting: Neurology

## 2018-01-17 ENCOUNTER — Ambulatory Visit (INDEPENDENT_AMBULATORY_CARE_PROVIDER_SITE_OTHER): Payer: Medicare Other | Admitting: Neurology

## 2018-01-17 VITALS — BP 130/80 | HR 74 | Ht 66.0 in | Wt 118.0 lb

## 2018-01-17 DIAGNOSIS — R442 Other hallucinations: Secondary | ICD-10-CM | POA: Diagnosis not present

## 2018-01-17 DIAGNOSIS — R002 Palpitations: Secondary | ICD-10-CM | POA: Diagnosis not present

## 2018-01-17 DIAGNOSIS — Z9989 Dependence on other enabling machines and devices: Secondary | ICD-10-CM | POA: Diagnosis not present

## 2018-01-17 DIAGNOSIS — G4710 Hypersomnia, unspecified: Secondary | ICD-10-CM

## 2018-01-17 DIAGNOSIS — G4733 Obstructive sleep apnea (adult) (pediatric): Secondary | ICD-10-CM

## 2018-01-17 DIAGNOSIS — R9431 Abnormal electrocardiogram [ECG] [EKG]: Secondary | ICD-10-CM | POA: Diagnosis not present

## 2018-01-17 DIAGNOSIS — I10 Essential (primary) hypertension: Secondary | ICD-10-CM | POA: Diagnosis not present

## 2018-01-17 NOTE — Progress Notes (Signed)
SLEEP MEDICINE CLINIC  PATIENT: Angela [name] DOB: 31-Mar-1945  REASON FOR VISIT: follow up HISTORY FROM: patient here alone. Primary patient of Dr Addison Lank  HISTORY OF PRESENT ILLNESS:  Today 01/17/18. [name] is meanwhile 73 years old.  I have the pleasure of seeing Angela [name] for the first time in 18 months, she had followed last year at this time with my nurse practitioner Vaughan Browner.  She states that she finds her level of fatigue not related to exercise and certainly not to physical demands but to a stress level related to the state of her home.  3 years ago she had an Chief of Staff, she [place] [employer], and the Stage manager required a restoration of the second floor, she describes that in the meantime the contractors had destroyed her home, had disassembled furniture but not put back together and she lives still between crates and boxes, something she has not unwrapped since the time of the restoration which is now 2 years ago.  She lives 9 months in the past [place] motel and she felt intimidated by the people that came in and out of her home and apparently also took hardwood floors out but did not replace them or we install some.  She feels that she is not at home in her home at this time.  She has been using the CPAP as we have discussed before 22 out of 30 days but she can hardly ever use it more than 4 hours.  The events per hours are an AHI of 2.3 with the majority being central in nature so an increase in pressure is not recommended the 95th percentile is 8.8 cmH2O on an AutoSet between 5 and 15 and an EPR level of 1.  Fatigue severity is variable and at this time around 36 points, her Epworth Sleepiness Scale was endorsed at 12 out of 24 points, the geriatric depression scale which has been answered at 3 out of 15 points.  She is followed by a pain management doctor, after her jaw surgeries.    Angela [name] is a 73 year old female with a history  of hypersomnia and obstructive sleep apnea on CPAP.  She returns for follow-up.  She reports that she has not been using her CPAP machine.  She reports that she called her DME company to advise that she cannot tolerate the increase in pressure.  She reports that her pressure has never been readjusted.  She states that she was originally on 8 and it was increased to 12 and she cannot tolerate this.  She also states that she was changed to a full facemask but does not feel that she understands how to correctly use the mask.  For these reasons she has not been using her CPAP machine.  She continues to use Adderall.  She feels that it does work well for her.  She does note that the initial effect has slowly decreased but she reports that it is definitely better than not taking anything at all.  She returns today for evaluation.  REVIEW OF SYSTEMS: Out of a complete 14 system review of symptoms, the patient complains only of the following symptoms, and all other reviewed systems are negative.  Epworth sleepiness score 23  ALLERGIES: Allergies  Allergen Reactions  . Nsaids Other (See Comments)    "have pain flare ups"; 03/03/13: skin blisters with any IBU, acetaminophen, Aleve.  Any anti-inflammatory  . Doxycycline Nausea And Vomiting and  Other (See Comments)    "blisters on feet"    HOME MEDICATIONS: Outpatient Medications Prior to Visit  Medication Sig Dispense Refill  . amphetamine-dextroamphetamine (ADDERALL) 10 MG tablet Take 1-2 tablets (10-20 mg total) by mouth daily with breakfast. 60 tablet 0  . ARMOUR THYROID PO Take 60 mcg by mouth daily.     . buprenorphine (SUBUTEX) 8 MG SUBL Place 4 mg under the tongue 3 (three) times daily.     . cholecalciferol (VITAMIN D) 1000 units tablet Take 1,000 Units by mouth daily.    . furosemide (LASIX) 20 MG tablet Take 20 mg by mouth daily as needed for fluid.   0  . Multiple Vitamin (MULTIVITAMIN) tablet Take 1 tablet by mouth daily.    . potassium  chloride (K-DUR) 10 MEQ tablet Take 10 mEq by mouth daily.   4  . traMADol (ULTRAM) 50 MG tablet 100 mg 4 (four) times daily.     . verapamil (VERELAN PM) 180 MG 24 hr capsule Take 180 mg by mouth at bedtime.    Marland Kitchen lisinopril-hydrochlorothiazide (PRINZIDE,ZESTORETIC) 10-12.5 MG per tablet Take 1 tablet by mouth daily. 30 tablet 0   No facility-administered medications prior to visit.     PAST MEDICAL HISTORY: Past Medical History:  Diagnosis Date  . Chronic pain   . Headache   . Hypertension   . Joint disorder   . 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 ABDOMINAL HYSTERECTOMY     Age 67  . ANKLE SURGERY     x3  . DILATION AND CURETTAGE OF UTERUS     x 4  . KNEE SURGERY     x 3  . MANDIBLE FRACTURE SURGERY     x 19    FAMILY HISTORY: Family History  Problem Relation Age of Onset  . Breast cancer Mother   . Rheum arthritis Mother   . Aneurysm Father   . Heart disease Father   . Throat cancer Father   . Melanoma Father   . Heart disease Brother   . Heart disease Brother   . Thyroid disease Brother   . Post-traumatic stress disorder Brother   . Rheum arthritis Brother   . Polymyositis Brother   . Obesity Brother     SOCIAL HISTORY: Social History   Socioeconomic History  . Marital status: Single    Spouse name: Not on file  . Number of children: 1  . Years of education: Bachelors  . Highest education level: Not on file  Occupational History  . Occupation: Part-time Secondary school teacher  Social Needs  . Financial resource strain: Not on file  . Food insecurity:    Worry: Not on file    Inability: Not on file  . Transportation needs:    Medical: Not on file    Non-medical: Not on file  Tobacco Use  . Smoking status: Former Research scientist (life sciences)  . Smokeless tobacco: Never Used  . Tobacco comment: Quit January 1980  Substance and Sexual Activity  . Alcohol use: Yes    Comment: occasional  . Drug use: No  .  Sexual activity: Not on file  Lifestyle  . Physical activity:    Days per week: Not on file    Minutes per session: Not on file  . Stress: Not on file  Relationships  . Social connections:    Talks on phone: Not on file    Gets together: Not on file  Attends religious service: Not on file    Active member of club or organization: Not on file    Attends meetings of clubs or organizations: Not on file    Relationship status: Not on file  . Intimate partner violence:    Fear of current or ex partner: Not on file    Emotionally abused: Not on file    Physically abused: Not on file    Forced sexual activity: Not on file  Other Topics Concern  . Not on file  Social History Narrative   Lives at home alone.   Right-handed.   2 cups caffeine per day.      PHYSICAL EXAM  Vitals:   01/17/18 0932  BP: 130/80  Pulse: 74  Weight: 118 lb (53.5 kg)  Height: 5\' 6"  (1.676 m)   Body mass index is 19.05 kg/m.  Generalized: Well developed, in no acute distress   Neurological examination  Mentation: Alert oriented to time, place, history taking. Follows all commands speech and language fluent Cranial nerve II-XII: Pupils were equal round reactive to light. Extraocular movements were full, visual field were full on confrontational test. Facial sensation and strength were normal. Uvula tongue midline. Head turning and shoulder shrug  were normal and symmetric. Motor: The motor testing reveals 5 over 5 strength of all 4 extremities. Good symmetric motor tone is noted throughout.  Sensory: Sensory testing is intact to soft touch on all 4 extremities. No evidence of extinction is noted.  Coordination: Cerebellar testing reveals good finger-nose-finger and heel-to-shin bilaterally.  Gait and station: Gait is normal.    DIAGNOSTIC DATA (LABS, IMAGING, TESTING) - I reviewed patient records, labs, notes, testing and imaging myself where available.  Lab Results  Component Value Date   WBC  5.3 03/05/2016   HGB 11.1 (L) 03/05/2016   HCT 33.8 (L) 03/05/2016   MCV 98.0 03/05/2016   PLT 215 03/05/2016      Component Value Date/Time   NA 139 03/05/2016 1325   K 3.7 03/05/2016 1325   CL 104 03/05/2016 1325   CO2 27 03/05/2016 1325   GLUCOSE 85 03/05/2016 1325   BUN 10 03/05/2016 1325   CREATININE 0.54 03/05/2016 1325   CALCIUM 8.6 (L) 03/05/2016 1325   PROT 5.4 (L) 03/05/2016 1325   ALBUMIN 3.3 (L) 03/05/2016 1325   AST 24 03/05/2016 1325   ALT 15 03/05/2016 1325   ALKPHOS 49 03/05/2016 1325   BILITOT 0.7 03/05/2016 1325   GFRNONAA >60 03/05/2016 1325   GFRAA >60 03/05/2016 1325      ASSESSMENT AND PLAN:  73 y.o. 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 here with:   1.  Obstructive sleep apnea on CPAP- poor compliance  2.  Hypersomnia 3.  Mind is racing - stress.   I continued the patient's settings to AutoSet 5-13 to see if she can better tolerate the settings.  She will continue on Adderall 10 mg 1-2 tablets daily for hypersomnia.  She will need another mask refitting today with our sleep lab.   She is advised that if her symptoms worsen or she develops new symptoms she should let us know. RV in 12 month.    I spent 20 minutes with the patient. 50% of this time was spent reviewing her CPAP and medication. We discussed her current level of stress and she appears very bothered by the state of her home.   Asencion Partridge [place],  MD   01/17/2018, 9:56 AM [facility] [address], [place], [place] [address] [phone]

## 2018-01-25 ENCOUNTER — Other Ambulatory Visit: Payer: Self-pay | Admitting: Gastroenterology

## 2018-01-25 DIAGNOSIS — Z86010 Personal history of colonic polyps: Secondary | ICD-10-CM | POA: Diagnosis not present

## 2018-01-25 DIAGNOSIS — R112 Nausea with vomiting, unspecified: Secondary | ICD-10-CM | POA: Diagnosis not present

## 2018-01-25 DIAGNOSIS — K581 Irritable bowel syndrome with constipation: Secondary | ICD-10-CM | POA: Diagnosis not present

## 2018-02-01 ENCOUNTER — Telehealth: Payer: Self-pay | Admitting: Neurology

## 2018-02-01 ENCOUNTER — Other Ambulatory Visit: Payer: Self-pay | Admitting: Neurology

## 2018-02-01 MED ORDER — AMPHETAMINE-DEXTROAMPHETAMINE 10 MG PO TABS: 10.0000 mg | ORAL_TABLET | Freq: Every day | ORAL | 0 refills | Status: DC

## 2018-02-01 NOTE — Telephone Encounter (Signed)
Patient calling to request refill of amphetamine-dextroamphetamine (ADDERALL) 10 MG tablet to be sent to [facility] on [address]

## 2018-02-01 NOTE — Telephone Encounter (Signed)
I have routed this request to Dr Dohmeier for review. The pt is due for the medication and [place] registry was verified.  

## 2018-02-03 DIAGNOSIS — H524 Presbyopia: Secondary | ICD-10-CM | POA: Diagnosis not present

## 2018-02-03 DIAGNOSIS — H2513 Age-related nuclear cataract, bilateral: Secondary | ICD-10-CM | POA: Diagnosis not present

## 2018-02-13 ENCOUNTER — Ambulatory Visit (HOSPITAL_COMMUNITY): Admit: 2018-02-13 | Payer: Medicare Other | Admitting: Gastroenterology

## 2018-02-13 ENCOUNTER — Ambulatory Visit: Payer: Self-pay | Admitting: Cardiology

## 2018-02-13 ENCOUNTER — Encounter (HOSPITAL_COMMUNITY): Payer: Self-pay

## 2018-02-13 SURGERY — EGD (ESOPHAGOGASTRODUODENOSCOPY)
Anesthesia: Monitor Anesthesia Care

## 2018-03-01 ENCOUNTER — Other Ambulatory Visit: Payer: Self-pay | Admitting: Gastroenterology

## 2018-03-01 DIAGNOSIS — R232 Flushing: Secondary | ICD-10-CM | POA: Diagnosis not present

## 2018-03-01 DIAGNOSIS — I10 Essential (primary) hypertension: Secondary | ICD-10-CM | POA: Diagnosis not present

## 2018-03-01 DIAGNOSIS — E039 Hypothyroidism, unspecified: Secondary | ICD-10-CM | POA: Diagnosis not present

## 2018-03-09 ENCOUNTER — Other Ambulatory Visit: Payer: Self-pay | Admitting: Neurology

## 2018-03-09 ENCOUNTER — Telehealth: Payer: Self-pay | Admitting: Neurology

## 2018-03-09 DIAGNOSIS — E039 Hypothyroidism, unspecified: Secondary | ICD-10-CM | POA: Diagnosis not present

## 2018-03-09 DIAGNOSIS — R5383 Other fatigue: Secondary | ICD-10-CM | POA: Diagnosis not present

## 2018-03-09 DIAGNOSIS — N951 Menopausal and female climacteric states: Secondary | ICD-10-CM | POA: Diagnosis not present

## 2018-03-09 DIAGNOSIS — I10 Essential (primary) hypertension: Secondary | ICD-10-CM | POA: Diagnosis not present

## 2018-03-09 DIAGNOSIS — M810 Age-related osteoporosis without current pathological fracture: Secondary | ICD-10-CM | POA: Diagnosis not present

## 2018-03-09 DIAGNOSIS — I5032 Chronic diastolic (congestive) heart failure: Secondary | ICD-10-CM | POA: Diagnosis not present

## 2018-03-09 DIAGNOSIS — G4733 Obstructive sleep apnea (adult) (pediatric): Secondary | ICD-10-CM | POA: Diagnosis not present

## 2018-03-09 DIAGNOSIS — E559 Vitamin D deficiency, unspecified: Secondary | ICD-10-CM | POA: Diagnosis not present

## 2018-03-09 MED ORDER — AMPHETAMINE-DEXTROAMPHETAMINE 10 MG PO TABS: 10.0000 mg | ORAL_TABLET | Freq: Every day | ORAL | 0 refills | Status: DC

## 2018-03-09 NOTE — Telephone Encounter (Signed)
Pt has called for a refill on her amphetamine-dextroamphetamine (ADDERALL) 10 MG tablet [id number]  

## 2018-03-09 NOTE — Telephone Encounter (Signed)
I have routed this request to Dr Dohmeier for review. The pt is due for the medication and [place] registry was verified.  

## 2018-03-13 DIAGNOSIS — E538 Deficiency of other specified B group vitamins: Secondary | ICD-10-CM | POA: Diagnosis not present

## 2018-03-13 DIAGNOSIS — N951 Menopausal and female climacteric states: Secondary | ICD-10-CM | POA: Diagnosis not present

## 2018-03-13 DIAGNOSIS — R5383 Other fatigue: Secondary | ICD-10-CM | POA: Diagnosis not present

## 2018-03-13 DIAGNOSIS — E559 Vitamin D deficiency, unspecified: Secondary | ICD-10-CM | POA: Diagnosis not present

## 2018-03-15 DIAGNOSIS — R232 Flushing: Secondary | ICD-10-CM | POA: Diagnosis not present

## 2018-03-15 DIAGNOSIS — E039 Hypothyroidism, unspecified: Secondary | ICD-10-CM | POA: Diagnosis not present

## 2018-03-15 DIAGNOSIS — I10 Essential (primary) hypertension: Secondary | ICD-10-CM | POA: Diagnosis not present

## 2018-03-16 DIAGNOSIS — L570 Actinic keratosis: Secondary | ICD-10-CM | POA: Diagnosis not present

## 2018-03-16 DIAGNOSIS — Z85828 Personal history of other malignant neoplasm of skin: Secondary | ICD-10-CM | POA: Diagnosis not present

## 2018-03-16 DIAGNOSIS — D485 Neoplasm of uncertain behavior of skin: Secondary | ICD-10-CM | POA: Diagnosis not present

## 2018-03-16 DIAGNOSIS — C44729 Squamous cell carcinoma of skin of left lower limb, including hip: Secondary | ICD-10-CM | POA: Diagnosis not present

## 2018-03-16 DIAGNOSIS — C44722 Squamous cell carcinoma of skin of right lower limb, including hip: Secondary | ICD-10-CM | POA: Diagnosis not present

## 2018-04-03 ENCOUNTER — Telehealth: Payer: Self-pay | Admitting: Neurology

## 2018-04-03 NOTE — Telephone Encounter (Signed)
Patient called in to refill amphetamine-dextroamphetamine (ADDERALL) 10 MG tablet and to be sent to [place], [place]

## 2018-04-03 NOTE — Telephone Encounter (Signed)
Script was last filled on 03/09/2018. I cant fill this until at earliest 3/25. Will send to Dr Brett Fairy on 3/25

## 2018-04-05 ENCOUNTER — Other Ambulatory Visit: Payer: Self-pay | Admitting: Neurology

## 2018-04-05 MED ORDER — AMPHETAMINE-DEXTROAMPHETAMINE 10 MG PO TABS: 10.0000 mg | ORAL_TABLET | Freq: Every day | ORAL | 0 refills | Status: DC

## 2018-04-05 NOTE — Telephone Encounter (Signed)
I have routed this request to Dr Dohmeier for review. The pt is due for the medication and [place] registry was verified.  

## 2018-04-13 DIAGNOSIS — I10 Essential (primary) hypertension: Secondary | ICD-10-CM | POA: Diagnosis not present

## 2018-04-13 DIAGNOSIS — E559 Vitamin D deficiency, unspecified: Secondary | ICD-10-CM | POA: Diagnosis not present

## 2018-04-13 DIAGNOSIS — R5383 Other fatigue: Secondary | ICD-10-CM | POA: Diagnosis not present

## 2018-04-13 DIAGNOSIS — E039 Hypothyroidism, unspecified: Secondary | ICD-10-CM | POA: Diagnosis not present

## 2018-04-13 DIAGNOSIS — D649 Anemia, unspecified: Secondary | ICD-10-CM | POA: Diagnosis not present

## 2018-04-13 DIAGNOSIS — Z79899 Other long term (current) drug therapy: Secondary | ICD-10-CM | POA: Diagnosis not present

## 2018-04-24 DIAGNOSIS — Z1212 Encounter for screening for malignant neoplasm of rectum: Secondary | ICD-10-CM | POA: Diagnosis not present

## 2018-04-27 ENCOUNTER — Encounter (HOSPITAL_COMMUNITY): Admission: RE | Payer: Self-pay | Source: Home / Self Care

## 2018-04-27 ENCOUNTER — Ambulatory Visit (HOSPITAL_COMMUNITY): Admission: RE | Admit: 2018-04-27 | Payer: Medicare Other | Source: Home / Self Care | Admitting: Gastroenterology

## 2018-04-27 SURGERY — ESOPHAGOGASTRODUODENOSCOPY (EGD) WITH PROPOFOL
Anesthesia: Monitor Anesthesia Care

## 2018-05-03 ENCOUNTER — Other Ambulatory Visit: Payer: Self-pay | Admitting: Gastroenterology

## 2018-05-03 DIAGNOSIS — R748 Abnormal levels of other serum enzymes: Secondary | ICD-10-CM

## 2018-05-04 ENCOUNTER — Other Ambulatory Visit: Payer: Self-pay | Admitting: Neurology

## 2018-05-04 ENCOUNTER — Telehealth: Payer: Self-pay | Admitting: Neurology

## 2018-05-04 MED ORDER — AMPHETAMINE-DEXTROAMPHETAMINE 10 MG PO TABS: 10.0000 mg | ORAL_TABLET | Freq: Every day | ORAL | 0 refills | Status: DC

## 2018-05-04 NOTE — Telephone Encounter (Signed)
I have routed this request to Dr Dohmeier for review. The pt is due for the medication and [place] registry was verified.  

## 2018-05-04 NOTE — Telephone Encounter (Signed)
Pt has called for a refill on her amphetamine-dextroamphetamine (ADDERALL) 10 MG tablet  [facility] [phone]

## 2018-05-12 ENCOUNTER — Other Ambulatory Visit: Payer: Self-pay

## 2018-05-12 ENCOUNTER — Ambulatory Visit
Admission: RE | Admit: 2018-05-12 | Discharge: 2018-05-12 | Disposition: A | Discharge status: Home / Self Care | Payer: Medicare Other | Source: Ambulatory Visit | Attending: Gastroenterology | Admitting: Gastroenterology

## 2018-05-12 DIAGNOSIS — R748 Abnormal levels of other serum enzymes: Secondary | ICD-10-CM | POA: Diagnosis not present

## 2018-06-02 ENCOUNTER — Other Ambulatory Visit: Payer: Self-pay | Admitting: Neurology

## 2018-06-02 NOTE — Telephone Encounter (Signed)
Pt is needing a refill on her amphetamine-dextroamphetamine (ADDERALL) 10 MG tablet sent to the [facility] on [address]

## 2018-06-06 MED ORDER — AMPHETAMINE-DEXTROAMPHETAMINE 10 MG PO TABS: 10.0000 mg | ORAL_TABLET | Freq: Every day | ORAL | 0 refills | Status: DC

## 2018-07-18 ENCOUNTER — Telehealth: Payer: Self-pay | Admitting: Neurology

## 2018-07-18 ENCOUNTER — Other Ambulatory Visit: Payer: Self-pay | Admitting: Neurology

## 2018-07-18 NOTE — Telephone Encounter (Signed)
I have routed this request to Dr Dohmeier for review. The pt is due for the medication and [place] registry was verified.  

## 2018-07-18 NOTE — Telephone Encounter (Signed)
Pt is needing a refill on her amphetamine-dextroamphetamine (ADDERALL) 10 MG tablet Sent to the [facility] on [address]

## 2018-07-19 MED ORDER — AMPHETAMINE-DEXTROAMPHETAMINE 10 MG PO TABS: 10.0000 mg | ORAL_TABLET | Freq: Every day | ORAL | 0 refills | Status: DC

## 2018-08-10 ENCOUNTER — Other Ambulatory Visit: Payer: Self-pay

## 2018-08-15 ENCOUNTER — Other Ambulatory Visit: Payer: Self-pay | Admitting: Neurology

## 2018-08-15 ENCOUNTER — Telehealth: Payer: Self-pay | Admitting: Neurology

## 2018-08-15 MED ORDER — AMPHETAMINE-DEXTROAMPHETAMINE 10 MG PO TABS: 10.0000 mg | ORAL_TABLET | Freq: Every day | ORAL | 0 refills | Status: DC

## 2018-08-15 NOTE — Telephone Encounter (Signed)
Routed to the work in MD in Dr Dohmeier's absence

## 2018-08-15 NOTE — Telephone Encounter (Signed)
Pt is requesting a refill of amphetamine-dextroamphetamine (ADDERALL) 10 MG tablet, to be sent to [id number] - [place], [place] - [address]

## 2018-08-15 NOTE — Telephone Encounter (Signed)
I have routed this request to Dr Dohmeier for review. The pt is due for the medication and [place] registry was verified.  

## 2018-08-24 DIAGNOSIS — K838 Other specified diseases of biliary tract: Secondary | ICD-10-CM | POA: Diagnosis not present

## 2018-08-24 DIAGNOSIS — G4733 Obstructive sleep apnea (adult) (pediatric): Secondary | ICD-10-CM | POA: Diagnosis not present

## 2018-08-24 DIAGNOSIS — I10 Essential (primary) hypertension: Secondary | ICD-10-CM | POA: Diagnosis not present

## 2018-08-24 DIAGNOSIS — K5900 Constipation, unspecified: Secondary | ICD-10-CM | POA: Diagnosis not present

## 2018-08-24 DIAGNOSIS — Z9989 Dependence on other enabling machines and devices: Secondary | ICD-10-CM | POA: Diagnosis not present

## 2018-09-13 ENCOUNTER — Other Ambulatory Visit: Payer: Self-pay

## 2018-09-13 ENCOUNTER — Other Ambulatory Visit: Payer: Self-pay | Admitting: Neurology

## 2018-09-13 MED ORDER — AMPHETAMINE-DEXTROAMPHETAMINE 10 MG PO TABS: 10.0000 mg | ORAL_TABLET | Freq: Every day | ORAL | 0 refills | Status: DC

## 2018-09-13 NOTE — Telephone Encounter (Signed)
Pt has called for a refill on her amphetamine-dextroamphetamine (ADDERALL) 10 MG tablet [id number]  

## 2018-09-13 NOTE — Telephone Encounter (Signed)
refilled 

## 2018-09-13 NOTE — Telephone Encounter (Signed)
Message sent and refill to Dr. Brett Fairy about adderall prescription.

## 2018-09-20 DIAGNOSIS — L603 Nail dystrophy: Secondary | ICD-10-CM | POA: Diagnosis not present

## 2018-09-20 DIAGNOSIS — Z85828 Personal history of other malignant neoplasm of skin: Secondary | ICD-10-CM | POA: Diagnosis not present

## 2018-09-22 ENCOUNTER — Other Ambulatory Visit: Payer: Self-pay | Admitting: Gastroenterology

## 2018-09-27 ENCOUNTER — Other Ambulatory Visit: Payer: Self-pay

## 2018-09-27 ENCOUNTER — Encounter: Payer: Self-pay | Admitting: Podiatry

## 2018-09-27 ENCOUNTER — Ambulatory Visit (INDEPENDENT_AMBULATORY_CARE_PROVIDER_SITE_OTHER): Payer: Medicare Other | Admitting: Podiatry

## 2018-09-27 VITALS — BP 124/72 | HR 68 | Resp 16

## 2018-09-27 DIAGNOSIS — L600 Ingrowing nail: Secondary | ICD-10-CM

## 2018-09-27 DIAGNOSIS — D367 Benign neoplasm of other specified sites: Secondary | ICD-10-CM

## 2018-09-27 DIAGNOSIS — L608 Other nail disorders: Secondary | ICD-10-CM | POA: Diagnosis not present

## 2018-09-27 NOTE — Addendum Note (Signed)
Addended by: Celene Skeen A on: 09/27/2018 05:51 PM   Modules accepted: Orders

## 2018-09-27 NOTE — Progress Notes (Signed)
Subjective:   Patient ID: Angela [name], female   DOB: 73 y.o.   MRN: [id number]   HPI Patient presents stating she has a discolored nail and she is concerned about the possibility for cancer as she has had a history of cancer in the past.  States is been relatively recent and she is interested biopsy and was referred here.  Patient does not smoke likes to be active   Review of Systems  All other systems reviewed and are negative.       Objective:  Physical Exam Vitals signs and nursing note reviewed.  Constitutional:      Appearance: She is well-developed.  Pulmonary:     Effort: Pulmonary effort is normal.  Musculoskeletal: Normal range of motion.  Skin:    General: Skin is warm.  Neurological:     Mental Status: She is alert.     Neurovascular status found to be intact muscle strength found to be adequate range of motion within normal limits.  Patient is noted to have on the medial side of the left hallux nail discoloration with slight darkening of the nailbed that is localized with no necrosis no breakdown of tissue noted     Assessment:  Possibility that this may be a pathological process or possibility that this may be traumatized nail bed     Plan:  Educated on both these findings and at this point in a great deal time going over treatment options.  She wants biopsy of the hallux nail is anesthetized 60 mg like Marcaine mixture medial border was removed sent for pathological evaluation we will see her back when results are achieved

## 2018-09-27 NOTE — Progress Notes (Signed)
   Subjective:    Patient ID: Angela [name], female    DOB: 1945/02/21, 73 y.o.   MRN: [id number]  HPI    Review of Systems  All other systems reviewed and are negative.      Objective:   Physical Exam        Assessment & Plan:

## 2018-09-27 NOTE — Patient Instructions (Signed)

## 2018-09-28 DIAGNOSIS — Z23 Encounter for immunization: Secondary | ICD-10-CM | POA: Diagnosis not present

## 2018-10-09 DIAGNOSIS — Z1231 Encounter for screening mammogram for malignant neoplasm of breast: Secondary | ICD-10-CM | POA: Diagnosis not present

## 2018-10-09 DIAGNOSIS — Z803 Family history of malignant neoplasm of breast: Secondary | ICD-10-CM | POA: Diagnosis not present

## 2018-10-11 ENCOUNTER — Telehealth: Payer: Self-pay | Admitting: Neurology

## 2018-10-11 ENCOUNTER — Telehealth: Payer: Self-pay | Admitting: *Deleted

## 2018-10-11 ENCOUNTER — Other Ambulatory Visit: Payer: Self-pay | Admitting: Neurology

## 2018-10-11 MED ORDER — AMPHETAMINE-DEXTROAMPHETAMINE 10 MG PO TABS: 10.0000 mg | ORAL_TABLET | Freq: Every day | ORAL | 0 refills | Status: DC

## 2018-10-11 NOTE — Telephone Encounter (Signed)
Pt is needing a refill on her amphetamine-dextroamphetamine (ADDERALL) 10 MG tablet sent to the [facility] on [address] °

## 2018-10-11 NOTE — Telephone Encounter (Signed)
I have routed this request to Dr Dohmeier for review. The pt is due for the medication and [place] registry was verified.  

## 2018-10-16 ENCOUNTER — Other Ambulatory Visit (HOSPITAL_COMMUNITY)
Admission: RE | Admit: 2018-10-16 | Discharge: 2018-10-16 | Disposition: A | Discharge status: Home / Self Care | Payer: Medicare Other | Source: Ambulatory Visit | Attending: Gastroenterology | Admitting: Gastroenterology

## 2018-10-16 DIAGNOSIS — Z20828 Contact with and (suspected) exposure to other viral communicable diseases: Secondary | ICD-10-CM | POA: Diagnosis not present

## 2018-10-16 DIAGNOSIS — Z01812 Encounter for preprocedural laboratory examination: Secondary | ICD-10-CM | POA: Insufficient documentation

## 2018-10-17 LAB — NOVEL CORONAVIRUS, NAA (HOSP ORDER, SEND-OUT TO REF LAB; TAT 18-24 HRS): SARS-CoV-2, NAA: NOT DETECTED

## 2018-10-18 ENCOUNTER — Other Ambulatory Visit: Payer: Self-pay

## 2018-10-18 ENCOUNTER — Encounter (HOSPITAL_COMMUNITY): Payer: Self-pay | Admitting: *Deleted

## 2018-10-18 NOTE — Progress Notes (Signed)
Pre call done. Pt stated she had been able to quarantine and has not had any fevers or flu like symptoms.questions answered.

## 2018-10-18 NOTE — H&P (Signed)
History of Present Illness  General:  73 year old female was referred for nausea and vomiting. She underwent a colonoscopy by Dr. Wynetta Emery in 2012 for screening and had a tubular adenoma removed from sigmoid colon. She reports that her entire whole life she has had issues with GI tract- as a kid she has had pain and bloating, mucos in stool, constipation, blood in stool. She was given medication for her "temper pills". 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 She has a lot of problems in her intestines. SHe has nausea if she misses a BM.She gets severe pain in her stomach and has to lay in the bathroom floor and feels that she is about to pass out, after a Bm 20 minutes later she has all ok. Symptoms have worsended in the past year. SHe gets bouts of nausea, takes zofran/ondansetron, she had eaten a par tof sandwich and vomited all her food at work.Yesterday morning she had a BM after using a dulcolax suppository after missing a BM for a day. She may need an hour to have a BM, she has difficulty pushing and straining. She has tried benefiber but it causes more pain, she has no BMs for days and needs to take more laxatives thereafter. She could not use amitiza or linzess as they were not covered by her insurance, she uses store brand laxative daily and dulcolax suppostiories as needed, she usuall has a Bm a day, denies blood in stool or black stools. Her paternal uncle had colon cancer in his 6s- it had metastasized. Denies acid reflux, heartburn, pain on swallowing or difficulty swallowing. She has been evaluated by ENT and was told she has GERD.   Current Medications  Taking   Amphetamine-Dextroamphetamine 10 MG Tablet (Schedule II Drug) TK 1 TO 2 TS PO D WITH BRE Oral   Armour Thyroid(Thyroid) 60 MG Tablet 1 tablet on an empty stomach Orally Once a day with the 15 mg dose for total daily of 45 mg, Notes: MAUNEY   Lisinopril 10 MG  Tablet 1 tablet Orally Once a day   Potassium Citrate ER 5 MEQ (540 MG) Tablet Extended Release 1 tablet with meals Orally once a day   Subutex(Buprenorphine HCl)   Tramadol HCl 50 MG Tablet (Schedule IV Drug) TK 2 TS PO QID Oral   Verapamil HCl ER 180 MG Capsule Extended Release 24 Hour TK 1 C D IN THE EVENING AT BEDTIME Oral   Vitamin D3 Maximum Strength(Cholecalciferol) 5000 UNIT Capsule 1 capsule Orally Once a day   Not-Taking   Buprenorphine HCl-Naloxone HCl 8-2 MG Tablet Sublingual 1 tablet under the tongue and allow to dissolve Sublingual Once a day   Discontinued   Armour Thyroid(Thyroid) 15 MG Tablet 1 tablet on an empty stomach Orally Once a day with the 30 mg dose for total daily of 45 mg, Notes: MAUNEY   Medication List reviewed and reconciled with the patient    Past Medical History  Mitral valve prolapse.   Hypertension.   constipation predominant IBS.   Chronic pain 2/2 19 jaw surgeries - follows c/ Dr. Lynnda Shields at [facility].   Skin cancer .    Surgical History  jaw surgery x 19   L knee surgery x 2   hysterectomy - still has ovaries   three D&Cs following miscarriages x 3   ankle surgery x 3 06/2010  skin cancer excision 12/2017   Family History  Father: deceased 84 yrs, throat cancer, AAA, CAD, diagnosed with COPD (chronic obstructive pulmonary disease)  Mother: deceased 89 yrs, Breast cancer  Brother 1: alive, heart problems, thyroid dis, PTSD, polio as a child,  Brother2: deceased 65 yrs, MI  Daughter(s): alive 23 yrs, thryoid cancer, mastectomy - 18 tumors  No Family History of Colon Cancer, Polyps, or Liver Disease.   Social History  General:  no EXPOSURE TO PASSIVE SMOKE.  Alcohol: yes, Daily, wine.  Children: girls, 1.  Caffeine: yes, coffee, 2 servings daily, tea, occasionally.  COMMUNICATION BARRIERS: none.  EDUCATION: Some College.  Tobacco use  cigarettes: Never smoked Tobacco history last updated 01/25/2018 Marital Status: single,  Divorced.  no Recreational drug use.  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  no Exercise.    Allergies  doxycycline: blisters on feet  NSAIDS: rash   Hospitalization/Major Diagnostic Procedure  Ankle surgery 06/2010  none 12/2015  not in past year 01/2018   Review of Systems  CONSTITUTIONAL:  Chills No. Fatigue YES. Fever No. Insomnia No. Night sweats YES. Weight change No.  HEENT:  Change in vision No. Double vision No . Hoarseness No. Nose bleeds No. sore throat No. Sinus Problems No. Glaucoma No.  CARDIOLOGY:  ByPass Surgery No. Poor Circulation No. Artificial Heart Valves No. High blood pressure YES. History of Heart attack No. Irregular heart beat YES. Known coronary artery disease No. Pacemaker/Defibrillator No.  RESPIRATORY:  Shortness of breath No. Cough No. Excessive Sputum No. Using Oxygen No. Asthma No. Bronchitis No. Pneumonia No. Sleep Apnea YES.  UROLOGY:  Interstitials Cystitis No. Incontinence No. Blood in urine No. Difficulty urinating YES. Kidney disease No. Kidney stones No.  GASTROENTEROLOGY:  Abdominal pain YES. Acid reflux No. Black stools No. Bloating/belching No. Change in bowel habits No. Constipation YES. Dark tarry stools No. Diarrhea No. Difficulty swallowing No. Heartburn No. Incontinence of stool No. Indigestion: No. Lactose intolerance No. Nausea YES. Rectal bleeding No. Vomiting YES. Hepatitis/yellow jaundice No. History of Ulcers No. Use of Pain Medication YES. Previous Colonoscopy YES.  MUSCULOSKELETAL:  joint pain No. Arthritis No. Joint Replacement No.  NEUROLOGY:  Dizziness No. Fainting No. Headache YES. Paralysis No. Seizures No. Strokes No. Weakness No. Alzheimer's No.  SKIN:  Rash No. Bruises easily No.  ENDOCRINOLOGY:  Diabetes No. High cholesterol No. Thyroid disorder YES.  HEMATOLOGY/LYMPH:  Abnormal bleeding No. Anemia No. Enlarged lymph nodes No. Past blood transfusion No. Swollen glands No. Blood Clots  No. Using Blood Thinners No.  INFECTIOUS DISEASE:  HIV/AIDS No. Tuberculosis No . Hepatitis No. Sexually Transmitted Disease No. MRSA No.  GI PROCEDURE:  Pacemaker/ AICD no. MI/heart attack no. Angina no. Artificial heart valves no. Abnormal heart rhythm no. CVA no. Hypertension YES. Hypotension no. Asthma, COPD no. Sleep apnea no. Seizure disorders no. Artificial joints no. Severe DJD no. Diabetes no. Significant headaches no. Vertigo no. Depression/anxiety no. Abnormal bleeding no.  GU/GYN:  Pregnant Now No. Trying to conceive No. Use Birth Control No. Heavy Periods No.      Vital Signs  Wt 115, Wt change -10.4 lb, Ht 65.25, BMI 18.99, Temp 98.3, Pulse sitting 85, BP sitting 84/61, Repeat BP 100/61.   Examination  Gastroenterology:: GENERAL APPEARANCE: Well developed, thinly built, no active distress, pleasant.  EYES: Lids and conjunctiva normal. Sclera normal.  ORAL CAVITY: Lips, teeth and gums are normal. Pharynx, tongue, mucosa normal .  SCLERA: anicteric .  CARDIOVASCULAR RRR no murmur, Normal RRR w/o murmers or gallops. No peripheral  edema .  RESPIRATORY Breath sounds normal. Respiration even and unlabored .  ABDOMEN No masses palpated. Liver and spleen not palpated, normal. Bowel sounds normal, Abdomen not distended .  EXTREMITIES: No edema, pulses intact .  NEURO: normal strength, normal gait .  PSYCH: mood/affect normal .     Assessments   1. Nausea and vomiting in adult - R11.2 (Primary)   2. History of adenomatous polyp of colon - Z86.010   3. 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 Nausea and vomiting in adult  IMAGING: Esophagoscopy    Whitfield,Dia 01/25/2018 11:33:46 AM > called endo scheduling-spoke with Kim-scheduled for 02/13/2018 at [facility] at 1:30pm-prep instructions reviewed with pt.   Notes: Will obtain biopsies from antrum and duodenum for H pylori and celiac disease.      2. History of adenomatous polyp of colon  IMAGING:  Colonoscopy    Whitfield,Dia 01/25/2018 11:33:46 AM > called endo scheduling-spoke with Kim-scheduled for 02/13/2018 at [facility] at 1:30pm-prep instructions reviewed with pt.      3. Irritable bowel syndrome with constipation  IMAGING: Colonoscopy    Whitfield,Dia 01/25/2018 11:33:46 AM > called endo scheduling-spoke with Kim-scheduled for 02/13/2018 at [facility] at 1:30pm-prep instructions reviewed with pt.   Notes: Advised high fiber diet and increased intake of fruits, vegetables and whole grains and to increase intake of water to at least 60 oz a day.      Ronnette Juniper, MD

## 2018-10-19 ENCOUNTER — Ambulatory Visit (HOSPITAL_COMMUNITY)
Admission: RE | Admit: 2018-10-19 | Discharge: 2018-10-19 | Disposition: A | Discharge status: Home / Self Care | Payer: Medicare Other | Attending: Gastroenterology | Admitting: Gastroenterology

## 2018-10-19 ENCOUNTER — Ambulatory Visit (HOSPITAL_COMMUNITY): Payer: Medicare Other | Admitting: Certified Registered"

## 2018-10-19 ENCOUNTER — Encounter (HOSPITAL_COMMUNITY)
Admission: RE | Disposition: A | Discharge status: Home / Self Care | Payer: Self-pay | Source: Home / Self Care | Attending: Gastroenterology

## 2018-10-19 ENCOUNTER — Encounter (HOSPITAL_COMMUNITY): Payer: Self-pay | Admitting: *Deleted

## 2018-10-19 DIAGNOSIS — K581 Irritable bowel syndrome with constipation: Secondary | ICD-10-CM | POA: Diagnosis not present

## 2018-10-19 DIAGNOSIS — R112 Nausea with vomiting, unspecified: Secondary | ICD-10-CM | POA: Diagnosis not present

## 2018-10-19 DIAGNOSIS — D123 Benign neoplasm of transverse colon: Secondary | ICD-10-CM | POA: Insufficient documentation

## 2018-10-19 DIAGNOSIS — Z86010 Personal history of colonic polyps: Secondary | ICD-10-CM | POA: Insufficient documentation

## 2018-10-19 DIAGNOSIS — Z79891 Long term (current) use of opiate analgesic: Secondary | ICD-10-CM | POA: Insufficient documentation

## 2018-10-19 DIAGNOSIS — K635 Polyp of colon: Secondary | ICD-10-CM | POA: Diagnosis not present

## 2018-10-19 DIAGNOSIS — Z800 Family history of malignant neoplasm of digestive organs: Secondary | ICD-10-CM | POA: Diagnosis not present

## 2018-10-19 DIAGNOSIS — D122 Benign neoplasm of ascending colon: Secondary | ICD-10-CM | POA: Insufficient documentation

## 2018-10-19 DIAGNOSIS — K3189 Other diseases of stomach and duodenum: Secondary | ICD-10-CM | POA: Diagnosis not present

## 2018-10-19 DIAGNOSIS — Z1211 Encounter for screening for malignant neoplasm of colon: Secondary | ICD-10-CM | POA: Diagnosis not present

## 2018-10-19 DIAGNOSIS — K648 Other hemorrhoids: Secondary | ICD-10-CM | POA: Diagnosis not present

## 2018-10-19 DIAGNOSIS — Z85828 Personal history of other malignant neoplasm of skin: Secondary | ICD-10-CM | POA: Diagnosis not present

## 2018-10-19 DIAGNOSIS — Z79899 Other long term (current) drug therapy: Secondary | ICD-10-CM | POA: Diagnosis not present

## 2018-10-19 DIAGNOSIS — K2950 Unspecified chronic gastritis without bleeding: Secondary | ICD-10-CM | POA: Diagnosis not present

## 2018-10-19 DIAGNOSIS — K5730 Diverticulosis of large intestine without perforation or abscess without bleeding: Secondary | ICD-10-CM | POA: Insufficient documentation

## 2018-10-19 DIAGNOSIS — I10 Essential (primary) hypertension: Secondary | ICD-10-CM | POA: Diagnosis not present

## 2018-10-19 DIAGNOSIS — D125 Benign neoplasm of sigmoid colon: Secondary | ICD-10-CM | POA: Diagnosis not present

## 2018-10-19 DIAGNOSIS — K2970 Gastritis, unspecified, without bleeding: Secondary | ICD-10-CM | POA: Diagnosis not present

## 2018-10-19 HISTORY — DX: Other complications of anesthesia, initial encounter: T88.59XA

## 2018-10-19 HISTORY — PX: COLONOSCOPY WITH PROPOFOL: SHX5780

## 2018-10-19 HISTORY — PX: ESOPHAGOGASTRODUODENOSCOPY (EGD) WITH PROPOFOL: SHX5813

## 2018-10-19 HISTORY — PX: POLYPECTOMY: SHX5525

## 2018-10-19 HISTORY — PX: BIOPSY: SHX5522

## 2018-10-19 HISTORY — DX: Nausea with vomiting, unspecified: R11.2

## 2018-10-19 HISTORY — DX: Other specified postprocedural states: Z98.890

## 2018-10-19 SURGERY — COLONOSCOPY WITH PROPOFOL
Anesthesia: Monitor Anesthesia Care

## 2018-10-19 MED ORDER — LIDOCAINE 2% (20 MG/ML) 5 ML SYRINGE
INTRAMUSCULAR | Status: DC | PRN
  Administered 2018-10-19: 40 mg via INTRAVENOUS

## 2018-10-19 MED ORDER — PROPOFOL 500 MG/50ML IV EMUL
INTRAVENOUS | Status: DC | PRN
  Administered 2018-10-19: 150 ug/kg/min via INTRAVENOUS

## 2018-10-19 MED ORDER — PROPOFOL 10 MG/ML IV BOLUS
INTRAVENOUS | Status: AC
  Filled 2018-10-19: qty 20

## 2018-10-19 MED ORDER — PROPOFOL 10 MG/ML IV BOLUS
INTRAVENOUS | Status: DC | PRN
  Administered 2018-10-19: 10 mg via INTRAVENOUS
  Administered 2018-10-19 (×2): 20 mg via INTRAVENOUS

## 2018-10-19 MED ORDER — PROPOFOL 10 MG/ML IV BOLUS
INTRAVENOUS | Status: AC
  Filled 2018-10-19: qty 60

## 2018-10-19 MED ORDER — SODIUM CHLORIDE 0.9 % IV SOLN: INTRAVENOUS | Status: DC

## 2018-10-19 MED ORDER — LACTATED RINGERS IV SOLN
INTRAVENOUS | Status: DC
  Administered 2018-10-19: 1000 mL via INTRAVENOUS

## 2018-10-19 SURGICAL SUPPLY — 25 items

## 2018-10-19 NOTE — Anesthesia Postprocedure Evaluation (Signed)
Anesthesia Post Note  Patient: Angela [name]  Procedure(s) Performed: COLONOSCOPY WITH PROPOFOL (N/A ) ESOPHAGOGASTRODUODENOSCOPY (EGD) WITH PROPOFOL (N/A ) BIOPSY POLYPECTOMY     Patient location during evaluation: PACU Anesthesia Type: MAC Level of consciousness: awake and alert Pain management: pain level controlled Vital Signs Assessment: post-procedure vital signs reviewed and stable Respiratory status: spontaneous breathing, nonlabored ventilation, respiratory function stable and patient connected to nasal cannula oxygen Cardiovascular status: stable and blood pressure returned to baseline Postop Assessment: no apparent nausea or vomiting Anesthetic complications: no    Last Vitals:  Vitals:   10/19/18 1010 10/19/18 1020  BP: 127/69 135/70  Pulse: 71 70  Resp: 15 13  Temp:    SpO2: 100% 100%    Last Pain:  Vitals:   10/19/18 1007  TempSrc: Temporal  PainSc: 0-No pain                 [name]

## 2018-10-19 NOTE — Op Note (Signed)
[facility] Patient Name: Angela [name] Procedure Date: 10/19/2018 MRN: [id number] Attending MD: Ronnette Juniper , MD Date of Birth: 05/04/1945 CSN: [id number] Age: 73 Admit Type: Outpatient Procedure:                Colonoscopy Indications:              Surveillance: Personal history of adenomatous                            polyps on last colonoscopy > 5 years ago, Last                            colonoscopy: 2012 Providers:                Ronnette Juniper, MD, Cleda Daub, RN, Marguerita Merles,                            Technician Referring MD:              Medicines:                Monitored Anesthesia Care Complications:            No immediate complications. Estimated blood loss:                            Minimal. Estimated Blood Loss:     Estimated blood loss was minimal. Procedure:                Pre-Anesthesia Assessment:                           - Prior to the procedure, a History and Physical                            was performed, and patient medications and                            allergies were reviewed. The patient's tolerance of                            previous anesthesia was also reviewed. The risks                            and benefits of the procedure and the sedation                            options and risks were discussed with the patient.                            All questions were answered, and informed consent                            was obtained. Prior Anticoagulants: The patient has                            taken no previous anticoagulant or antiplatelet  agents. ASA Grade Assessment: III - A patient with                            severe systemic disease. After reviewing the risks                            and benefits, the patient was deemed in                            satisfactory condition to undergo the procedure.                           After obtaining informed consent, the colonoscope                       was passed under direct vision. Throughout the                            procedure, the patient's blood pressure, pulse, and                            oxygen saturations were monitored continuously. The                            [id number]-PH190L ([id number] Olympus ultra slim endoscope                            was introduced through the anus and advanced to the                            the cecum, identified by appendiceal orifice and                            ileocecal valve. The colonoscopy was performed                            without difficulty. The patient tolerated the                            procedure well. The quality of the bowel                            preparation was adequate to identify polyps 6 mm                            and larger in size. Scope In: 9:24:55 AM Scope Out: 9:56:11 AM Scope Withdrawal Time: 0 hours 19 minutes 44 seconds  Total Procedure Duration: 0 hours 31 minutes 16 seconds  Findings:      The perianal and digital rectal examinations were normal.      Non-bleeding internal hemorrhoids were found during retroflexion. The       hemorrhoids were medium-sized.      A few small-mouthed diverticula were found in the sigmoid colon and       descending colon.  A 6 mm polyp was found in the sigmoid colon. The polyp was sessile. The       polyp was removed with a hot snare. Resection and retrieval were       complete.      Four sessile polyps were found in the transverse colon. The polyps were       4 to 7 mm in size. These polyps were removed with a hot snare and cold       biopsy forceps. Resection and retrieval were complete.      Five sessile polyps were found in the ascending colon. The polyps were 4       to 10 mm in size. These polyps were removed with a hot snare and cold       biopsy forceps. Resection and retrieval were complete.      The exam was otherwise without abnormality. Impression:               - Non-bleeding  internal hemorrhoids.                           - Diverticulosis in the sigmoid colon and in the                            descending colon.                           - One 6 mm polyp in the sigmoid colon, removed with                            a hot snare. Resected and retrieved.                           - Four 4 to 7 mm polyps in the transverse colon,                            removed with a hot snare. Resected and retrieved.                           - Five 4 to 10 mm polyps in the ascending colon,                            removed with a hot snare. Resected and retrieved.                           - The examination was otherwise normal. Moderate Sedation:      Patient did not receive moderate sedation for this procedure, but       instead received monitored anesthesia care. Recommendation:           - Patient has a contact number available for                            emergencies. The signs and symptoms of potential                            delayed complications were discussed with the  patient. Return to normal activities tomorrow.                            Written discharge instructions were provided to the                            patient.                           - Resume regular diet.                           - Continue present medications.                           - Await pathology results.                           - Repeat colonoscopy for surveillance based on                            pathology results. Procedure Code(s):        --- Professional ---                           [id number], Colonoscopy, flexible; with removal of                            tumor(s), polyp(s), or other lesion(s) by snare                            technique Diagnosis Code(s):        --- Professional ---                           K63.5, Polyp of colon                           Z86.010, Personal history of colonic polyps                           K64.8, Other  hemorrhoids                           K57.30, Diverticulosis of large intestine without                            perforation or abscess without bleeding CPT copyright 2019 American [employer] Association. All rights reserved. The codes documented in this report are preliminary and upon coder review may  be revised to meet current compliance requirements. Ronnette Juniper, MD 10/19/2018 10:01:57 AM This report has been signed electronically. Number of Addenda: 0

## 2018-10-19 NOTE — Transfer of Care (Signed)
Immediate Anesthesia Transfer of Care Note  Patient: Shakenna Herrero  Procedure(s) Performed: COLONOSCOPY WITH PROPOFOL (N/A ) ESOPHAGOGASTRODUODENOSCOPY (EGD) WITH PROPOFOL (N/A ) BIOPSY POLYPECTOMY  Patient Location: PACU and Endoscopy Unit  Anesthesia Type:MAC  Level of Consciousness: awake, alert  and oriented  Airway & Oxygen Therapy: Patient Spontanous Breathing and Patient connected to nasal cannula oxygen  Post-op Assessment: Report given to RN and Post -op Vital signs reviewed and stable  Post vital signs: Reviewed and stable  Last Vitals:  Vitals Value Taken Time  BP 91/73 10/19/18 1007  Temp 36 C 10/19/18 1007  Pulse 71 10/19/18 1009  Resp 15 10/19/18 1009  SpO2 100 % 10/19/18 1009  Vitals shown include unvalidated device data.  Last Pain:  Vitals:   10/19/18 1007  TempSrc: Temporal  PainSc: 0-No pain         Complications: No apparent anesthesia complications

## 2018-10-19 NOTE — Anesthesia Procedure Notes (Signed)
Procedure Name: MAC Date/Time: 10/19/2018 9:15 AM Performed by: Eben Burow, CRNA Pre-anesthesia Checklist: Patient identified, Emergency Drugs available, Suction available, Patient being monitored and Timeout performed Oxygen Delivery Method: Nasal cannula Dental Injury: Teeth and Oropharynx as per pre-operative assessment

## 2018-10-19 NOTE — Discharge Instructions (Signed)

## 2018-10-19 NOTE — Addendum Note (Signed)
Addendum  created 10/19/18 1217 by Janeece Riggers, MD   Clinical Note Signed

## 2018-10-19 NOTE — Op Note (Signed)
[facility] Patient Name: Angela [name] Procedure Date: 10/19/2018 MRN: [id number] Attending MD: Ronnette Juniper , MD Date of Birth: 1945/08/08 CSN: [id number] Age: 73 Admit Type: Outpatient Procedure:                Upper GI endoscopy Indications:              Nausea with vomiting Providers:                Ronnette Juniper, MD, Cleda Daub, RN, Marguerita Merles,                            Technician Referring MD:              Medicines:                Monitored Anesthesia Care Complications:            No immediate complications. Estimated blood loss:                            Minimal. Estimated Blood Loss:     Estimated blood loss was minimal. Procedure:                Pre-Anesthesia Assessment:                           - Prior to the procedure, a History and Physical                            was performed, and patient medications and                            allergies were reviewed. The patient's tolerance of                            previous anesthesia was also reviewed. The risks                            and benefits of the procedure and the sedation                            options and risks were discussed with the patient.                            All questions were answered, and informed consent                            was obtained. Prior Anticoagulants: The patient has                            taken no previous anticoagulant or antiplatelet                            agents. ASA Grade Assessment: III - A patient with                            severe systemic  disease. After reviewing the risks                            and benefits, the patient was deemed in                            satisfactory condition to undergo the procedure.                           After obtaining informed consent, the endoscope was                            passed under direct vision. Throughout the                            procedure, the patient's blood pressure,  pulse, and                            oxygen saturations were monitored continuously. The                            [id number] Olympus gastroscope was                            introduced through the mouth, and advanced to the                            second part of duodenum. The upper GI endoscopy was                            accomplished without difficulty. The patient                            tolerated the procedure well. Findings:      The examined esophagus was normal.      The Z-line was regular and was found 38 cm from the incisors.      Localized mildly erythematous mucosa without bleeding was found in the       gastric antrum. Biopsies were taken with a cold forceps for Helicobacter       pylori testing.      The cardia and gastric fundus were normal on retroflexion.      The examined duodenum was normal. Biopsies for histology were taken with       a cold forceps for evaluation of celiac disease. Impression:               - Normal esophagus.                           - Z-line regular, 38 cm from the incisors.                           - Erythematous mucosa in the antrum. Biopsied.                           - Normal examined duodenum. Biopsied.  Moderate Sedation:      Patient did not receive moderate sedation for this procedure, but       instead received monitored anesthesia care. Recommendation:           - Patient has a contact number available for                            emergencies. The signs and symptoms of potential                            delayed complications were discussed with the                            patient. Return to normal activities tomorrow.                            Written discharge instructions were provided to the                            patient.                           - Resume regular diet.                           - Continue present medications.                           - Await pathology results. Procedure Code(s):         --- Professional ---                           [phone], Esophagogastroduodenoscopy, flexible,                            transoral; with biopsy, single or multiple Diagnosis Code(s):        --- Professional ---                           K31.89, Other diseases of stomach and duodenum                           R11.2, Nausea with vomiting, unspecified CPT copyright 2019 American Medical Association. All rights reserved. The codes documented in this report are preliminary and upon coder review may  be revised to meet current compliance requirements. Ronnette Juniper, MD 10/19/2018 10:03:59 AM This report has been signed electronically. Number of Addenda: 0

## 2018-10-19 NOTE — Anesthesia Postprocedure Evaluation (Signed)
Anesthesia Post Note  Patient: Angela [name]  Procedure(s) Performed: COLONOSCOPY WITH PROPOFOL (N/A ) ESOPHAGOGASTRODUODENOSCOPY (EGD) WITH PROPOFOL (N/A ) BIOPSY POLYPECTOMY     Patient location during evaluation: PACU Anesthesia Type: MAC Level of consciousness: awake and alert Pain management: pain level controlled Vital Signs Assessment: post-procedure vital signs reviewed and stable Respiratory status: spontaneous breathing, nonlabored ventilation, respiratory function stable and patient connected to nasal cannula oxygen Cardiovascular status: stable and blood pressure returned to baseline Postop Assessment: no apparent nausea or vomiting Anesthetic complications: no    Last Vitals:  Vitals:   10/19/18 1010 10/19/18 1020  BP: 127/69 135/70  Pulse: 71 70  Resp: 15 13  Temp:    SpO2: 100% 100%    Last Pain:  Vitals:   10/19/18 1007  TempSrc: Temporal  PainSc: 0-No pain                 Morning Halberg

## 2018-10-19 NOTE — Interval H&P Note (Signed)
History and Physical Interval Note: 73/female with nausea, vomiting and history of tubular adenoma in 2012 for an EGD and colonoscopy today. 10/19/2018 9:08 AM  Luellen Pucker  has presented today for EGD and colonoscopy, with the diagnosis of History of colon polyps/nausea/vomiting.  The various methods of treatment have been discussed with the patient and family. After consideration of risks, benefits and other options for treatment, the patient has consented to  Procedure(s): COLONOSCOPY WITH PROPOFOL (N/A) ESOPHAGOGASTRODUODENOSCOPY (EGD) WITH PROPOFOL (N/A) as a surgical intervention.  The patient's history has been reviewed, patient examined, no change in status, stable for surgery.  I have reviewed the patient's chart and labs.  Questions were answered to the patient's satisfaction.     Ronnette Juniper

## 2018-10-19 NOTE — Anesthesia Preprocedure Evaluation (Addendum)
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  She is concerned with further trauma to the joint. Dental no notable dental hx. (+) Teeth Intact, Dental Advisory Given   Pulmonary neg pulmonary ROS, former smoker,    Pulmonary exam normal breath sounds clear to auscultation       Cardiovascular Exercise Tolerance: Good hypertension, Pt. on medications negative cardio ROS   Rhythm:regular Rate:Normal     Neuro/Psych negative neurological ROS  negative psych ROS   GI/Hepatic negative GI ROS, Neg liver ROS,   Endo/Other  negative endocrine ROS  Renal/GU negative Renal ROS  negative genitourinary   Musculoskeletal   Abdominal   Peds  Hematology negative hematology ROS (+)   Anesthesia Other Findings   Reproductive/Obstetrics negative OB ROS                            Anesthesia Physical Anesthesia Plan  ASA: II  Anesthesia Plan: MAC   Post-op Pain Management:    Induction: Intravenous  PONV Risk Score and Plan:   Airway Management Planned: Mask and Natural Airway  Additional Equipment:   Intra-op Plan:   Post-operative Plan:   Informed Consent: I have reviewed the patients History and Physical, chart, labs and discussed the procedure including the risks, benefits and alternatives for the proposed anesthesia with the patient or authorized representative who has indicated his/her understanding and acceptance.     Dental  Advisory Given  Plan Discussed with: CRNA  Anesthesia Plan Comments:         Anesthesia Quick Evaluation

## 2018-10-19 NOTE — Brief Op Note (Signed)
10/19/2018  10:04 AM  PATIENT:  Angela [name]  73 y.o. female  PRE-OPERATIVE DIAGNOSIS:  History of colon polyps/nausea/vomiting  POST-OPERATIVE DIAGNOSIS:  colon polyps / gastritis  PROCEDURE:  Procedure(s): COLONOSCOPY WITH PROPOFOL (N/A) ESOPHAGOGASTRODUODENOSCOPY (EGD) WITH PROPOFOL (N/A) BIOPSY POLYPECTOMY  SURGEON:  Surgeon(s) and Role:    Ronnette Juniper, MD - Primary  PHYSICIAN ASSISTANT:   ASSISTANTS: Lilli Few, Brooke Cummings,Tech  ANESTHESIA:   MAC  EBL:  Minimal  BLOOD ADMINISTERED:none  DRAINS: none   LOCAL MEDICATIONS USED:  NONE  SPECIMEN:  Biopsy / Limited Resection  DISPOSITION OF SPECIMEN:  PATHOLOGY  COUNTS:  YES  TOURNIQUET:  * No tourniquets in log *  DICTATION: .Dragon Dictation  PLAN OF CARE: Discharge to home after PACU  PATIENT DISPOSITION:  PACU - hemodynamically stable.   Delay start of Pharmacological VTE agent (>24hrs) due to surgical blood loss or risk of bleeding: no

## 2018-10-20 ENCOUNTER — Ambulatory Visit (INDEPENDENT_AMBULATORY_CARE_PROVIDER_SITE_OTHER): Payer: Medicare Other | Admitting: Podiatry

## 2018-10-20 ENCOUNTER — Other Ambulatory Visit: Payer: Self-pay

## 2018-10-20 ENCOUNTER — Ambulatory Visit: Payer: Medicare Other | Admitting: Podiatry

## 2018-10-20 ENCOUNTER — Encounter (HOSPITAL_COMMUNITY): Payer: Self-pay | Admitting: Gastroenterology

## 2018-10-20 DIAGNOSIS — L600 Ingrowing nail: Secondary | ICD-10-CM | POA: Diagnosis not present

## 2018-10-20 DIAGNOSIS — D367 Benign neoplasm of other specified sites: Secondary | ICD-10-CM | POA: Diagnosis not present

## 2018-10-20 LAB — SURGICAL PATHOLOGY

## 2018-10-23 ENCOUNTER — Encounter: Payer: Self-pay | Admitting: Podiatry

## 2018-10-23 NOTE — Progress Notes (Signed)
Subjective:  Patient ID: Angela [name], female    DOB: [birth year],  MRN: [id number]  Chief Complaint  Patient presents with  . Foot Pain    pt is here for some biopsy results, pt has no other comments or concerns    73 y.o. female presents with the above complaint. Hx confirmed with patient.  Patient was last seen by Dr. Paulla Dolly on 09/27/2018.  At that time me and Dr. Paulla Dolly had removed however toenail partially nonpermanent.  She was concerned for possible skin cancer given her history of multiple squamous cell carcinomas.  Patient had a procedure done to remove the corner of the nail and sent for pathology.  She is here today to for review of the results.  Review of Systems: Negative except as noted in the HPI. Denies N/V/F/Ch.  Past Medical History:  Diagnosis Date  . Chronic pain   . Complication of anesthesia   . Headache   . Hypertension   . Joint disorder   . PONV (postoperative nausea and vomiting)   . 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  amphetamine-dextroamphetamine (ADDERALL) 10 MG tablet, Take 1-2 tablets (10-20 mg total) by mouth daily with breakfast. (Patient taking differently: Take 10 mg by mouth 2 (two) times daily. ), Disp: 60 tablet, Rfl: 0 .  BISACODYL 5 MG EC tablet, TK UTD, Disp: , Rfl:  .  buprenorphine (SUBUTEX) 8 MG SUBL, Place 4 mg under the tongue 4 (four) times daily. , Disp: , Rfl:  .  Carboxymethylcellul-Glycerin (LUBRICATING EYE DROPS OP), Place 1 drop into both eyes 4 (four) times daily as needed (dry eyes)., Disp: , Rfl:  .  furosemide (LASIX) 20 MG tablet, Take 40-60 mg by mouth daily as needed (fluid retention (feet/legs)). , Disp: , Rfl: 0 .  lisinopril-hydrochlorothiazide (PRINZIDE,ZESTORETIC) 10-12.5 MG tablet, Take 1 tablet by mouth every evening. , Disp: , Rfl:  .  modafinil (PROVIGIL) 100 MG tablet, Take 100 mg by mouth 2 (two) times daily. , Disp: , Rfl:  .  Multiple Vitamin (MULTIVITAMIN WITH MINERALS) TABS tablet, Take 1  tablet by mouth 3 (three) times a week. , Disp: , Rfl:  .  thyroid (ARMOUR) 60 MG tablet, Take 60 mg by mouth daily before breakfast. , Disp: , Rfl:   Social History   Tobacco Use  Smoking Status Former Smoker  Smokeless Tobacco Never Used  Tobacco Comment   Quit January 1980    Allergies  Allergen Reactions  . 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 Doxycycline Nausea And Vomiting and Other (See Comments)    "blisters on feet"   Objective:  There were no vitals filed for this visit. There is no height or weight on file to calculate BMI. Constitutional no acute distress, alert/oriented x3 and appropriate mood and affect  Vascular Dorsalis pedis pulse: present Posterior tibial pulse: present and  Hair pattern: normal Warmth: no warmth  Neurologic Epicritic sensations intact  Dermatologic  left medial hallux nail border appears to be healing well.  No clinical signs of infection noted.  No tenderness noted.  Orthopedic: Swelling: none Tenderness: none Deformity: None Examination of the feet reveals warm, good capillary refill.    Radiographs: None Assessment:  No diagnosis found. Plan:  Patient was evaluated and treated and all questions answered.  Left medial hallux nail border biopsy  I went over the  biopsy results from day ago the results shows that there is no concerns for intracorneal melanin pigment nor melanocytes.  There is also no fungal elements presents as well.  Therefore I explained and educated the patient that the odds of this nail having skin cancer is negative given the pathology report.  I addressed all questions and concerns.  No follow-ups on file.

## 2018-11-01 ENCOUNTER — Ambulatory Visit: Payer: Medicare Other | Admitting: Podiatry

## 2018-11-03 NOTE — Telephone Encounter (Signed)
Entered in error

## 2018-11-09 ENCOUNTER — Telehealth: Payer: Self-pay | Admitting: Neurology

## 2018-11-09 ENCOUNTER — Other Ambulatory Visit: Payer: Self-pay | Admitting: Neurology

## 2018-11-09 MED ORDER — AMPHETAMINE-DEXTROAMPHETAMINE 10 MG PO TABS: 10.0000 mg | ORAL_TABLET | Freq: Every day | ORAL | 0 refills | Status: DC

## 2018-11-09 NOTE — Telephone Encounter (Signed)
I have routed this request to Dr Dohmeier for review. The pt is due for the medication and [place] registry was verified.  

## 2018-11-09 NOTE — Telephone Encounter (Signed)
Pt has called for a refill on her amphetamine-dextroamphetamine (ADDERALL) 10 MG tablet [id number]  

## 2018-11-23 DIAGNOSIS — L659 Nonscarring hair loss, unspecified: Secondary | ICD-10-CM | POA: Diagnosis not present

## 2018-11-23 DIAGNOSIS — E039 Hypothyroidism, unspecified: Secondary | ICD-10-CM | POA: Diagnosis not present

## 2018-11-23 DIAGNOSIS — R5381 Other malaise: Secondary | ICD-10-CM | POA: Diagnosis not present

## 2018-11-23 DIAGNOSIS — R531 Weakness: Secondary | ICD-10-CM | POA: Diagnosis not present

## 2018-11-29 ENCOUNTER — Ambulatory Visit (INDEPENDENT_AMBULATORY_CARE_PROVIDER_SITE_OTHER): Payer: Medicare Other | Admitting: Podiatry

## 2018-11-29 ENCOUNTER — Encounter: Payer: Self-pay | Admitting: Podiatry

## 2018-11-29 ENCOUNTER — Other Ambulatory Visit: Payer: Self-pay

## 2018-11-29 DIAGNOSIS — L600 Ingrowing nail: Secondary | ICD-10-CM

## 2018-11-29 DIAGNOSIS — D367 Benign neoplasm of other specified sites: Secondary | ICD-10-CM | POA: Diagnosis not present

## 2018-11-29 NOTE — Progress Notes (Signed)
Subjective:   Patient ID: Angela [name], female   DOB: 73 y.o.   MRN: [id number]   HPI Patient presents stating she is still concerned about her nail and states it bothers her at times and she is concerned about the pathology   ROS      Objective:  Physical Exam  Neurovascular status intact with removal of the nail border left hallux medial side with no current drainage erythema edema noted and only slight discoloration in the remainder of the nail     Assessment:  Overall doing well with healing still finishing on the left hallux nail from previous excision     Plan:  H&P spent a great deal of time assuring her that the pathology appeared normal and no indications of melanoma.  Also apply cushion to the toe to try to take pressure off and advised on continued soaks and reappoint to recheck as needed

## 2018-12-11 ENCOUNTER — Other Ambulatory Visit: Payer: Self-pay | Admitting: Neurology

## 2018-12-11 MED ORDER — AMPHETAMINE-DEXTROAMPHETAMINE 10 MG PO TABS: 10.0000 mg | ORAL_TABLET | Freq: Every day | ORAL | 0 refills | Status: DC

## 2018-12-11 NOTE — Telephone Encounter (Signed)
Pt is needing a refill on her amphetamine-dextroamphetamine (ADDERALL) 10 MG tablet sent to the [facility] on [address]

## 2018-12-13 ENCOUNTER — Ambulatory Visit (INDEPENDENT_AMBULATORY_CARE_PROVIDER_SITE_OTHER): Payer: Medicare Other | Admitting: Physician Assistant

## 2018-12-13 ENCOUNTER — Encounter: Payer: Self-pay | Admitting: Physician Assistant

## 2018-12-13 ENCOUNTER — Ambulatory Visit (INDEPENDENT_AMBULATORY_CARE_PROVIDER_SITE_OTHER): Payer: Medicare Other

## 2018-12-13 ENCOUNTER — Other Ambulatory Visit: Payer: Self-pay

## 2018-12-13 DIAGNOSIS — M25511 Pain in right shoulder: Secondary | ICD-10-CM | POA: Diagnosis not present

## 2018-12-13 MED ORDER — METHYLPREDNISOLONE ACETATE 40 MG/ML IJ SUSP
40.0000 mg | INTRAMUSCULAR | Status: AC | PRN
  Administered 2018-12-13: 40 mg via INTRA_ARTICULAR

## 2018-12-13 MED ORDER — LIDOCAINE HCL 1 % IJ SOLN
3.0000 mL | INTRAMUSCULAR | Status: AC | PRN
  Administered 2018-12-13: 3 mL

## 2018-12-13 NOTE — Progress Notes (Signed)
Office Visit Note   Patient: Angela [name]           Date of Birth: Oct 13, 1945           MRN: [id number] Visit Date: 12/13/2018              Requested by: No referring provider defined for this encounter. PCP: Thressa Sheller, MD (Inactive)   Assessment & Plan: Visit Diagnoses:  1. Right shoulder pain, unspecified chronicity     Plan:  We will see her back in 2 weeks see what type of response she had to the injection.  She is shown Codman exercises, forward flexion and wall crawls she will perform these on her own.  Questions encouraged and answered.  Follow-Up Instructions: No follow-ups on file.   Orders:  Orders Placed This Encounter  Procedures   Large Joint Inj   XR Shoulder Right   No orders of the defined types were placed in this encounter.     Procedures: Large Joint Inj: R subacromial bursa on 12/13/2018 5:46 PM Indications: pain Details: 22 G 1.5 in needle, lateral approach  Arthrogram: No  Medications: 3 mL lidocaine 1 %; 40 mg methylPREDNISolone acetate 40 MG/ML Outcome: tolerated well, no immediate complications Procedure, treatment alternatives, risks and benefits explained, specific risks discussed. Consent was given by the patient. Immediately prior to procedure a time out was called to verify the correct patient, procedure, equipment, support staff and site/side marked as required. Patient was prepped and draped in the usual sterile fashion.       Clinical Data: No additional findings.   Subjective: Chief Complaint  Patient presents with   Right Shoulder - Pain    HPI Angela [name] is well-known to Dr. Ninfa Linden service comes in today with right shoulder pain that is been ongoing for months.  No known injury.  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  She is having no real radicular radicular symptoms down the arm pain mostly from the right shoulder down to the elbow.  She has trouble  sleeping.  Has a sharp pain within the shoulder.  Has pain reaching behind her and getting dressed. Review of Systems See HPI otherwise negative or noncontributory  Objective: Vital Signs: There were no vitals taken for this visit.  Physical Exam Constitutional:      Appearance: She is normal weight. She is not ill-appearing or diaphoretic.  Pulmonary:     Effort: Pulmonary effort is normal.  Neurological:     Mental Status: She is alert and oriented to person, place, and time.  Psychiatric:        Mood and Affect: Mood normal.     Ortho Exam Bilateral shoulder she has bilateral 5 strength with external and internal rotation against resistance.  Empty can test is negative bilaterally.  Impingement testing on the right is slightly positive.  Negative liftoff test but painful on the right performing this maneuver.  Distal biceps was intact at the right elbow.  No evidence of biceps rupture right arm. Specialty Comments:  No specialty comments available.  Imaging: Xr Shoulder Right  Result Date: 12/13/2018 Radiographs 3 views right shoulder: Shoulders well located.  No acute fractures.  Glenohumeral joint appears well preserved.    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   New onset headache 01/19/2016   Excessive sleepiness 01/19/2016   HYPERTENSION, BENIGN 03/10/2009   HYPERTENSION, UNCONTROLLED 01/29/2009   ARM PAIN, LEFT 01/29/2009   Past Medical History:  Diagnosis Date   Chronic pain    Complication of anesthesia    Headache    Hypertension    Joint disorder    PONV (postoperative nausea and vomiting)    Porphyria ([place])     Family History  Problem Relation Age of Onset   Breast  cancer Mother    Rheum arthritis Mother    Aneurysm Father    Heart disease Father    Throat cancer Father    Melanoma Father    Heart disease Brother    Heart disease Brother    Thyroid disease Brother    Post-traumatic stress disorder Brother    Rheum arthritis Brother    Polymyositis Brother    Obesity Brother     Past Surgical History:  Procedure Laterality Date   ABDOMINAL HYSTERECTOMY     Age 59   ANKLE SURGERY     x3   BIOPSY  10/19/2018   Procedure: BIOPSY;  Surgeon: Ronnette Juniper, MD;  Location: [facility];  Service: Gastroenterology;;   COLONOSCOPY WITH PROPOFOL N/A 10/19/2018   Procedure: COLONOSCOPY WITH PROPOFOL;  Surgeon: Ronnette Juniper, MD;  Location: [facility];  Service: Gastroenterology;  Laterality: N/A;   DILATION AND CURETTAGE OF UTERUS     x 4   ESOPHAGOGASTRODUODENOSCOPY (EGD) WITH PROPOFOL N/A 10/19/2018   Procedure: ESOPHAGOGASTRODUODENOSCOPY (EGD) WITH PROPOFOL;  Surgeon: Ronnette Juniper, MD;  Location: [facility];  Service: Gastroenterology;  Laterality: N/A;   KNEE SURGERY     x 3   MANDIBLE FRACTURE SURGERY     x 19   POLYPECTOMY  10/19/2018   Procedure: POLYPECTOMY;  Surgeon: Ronnette Juniper, MD;  Location: [facility];  Service: Gastroenterology;;   Social History   Occupational History   Occupation: Part-time Secondary school teacher  Tobacco Use   Smoking status: Former Smoker   Smokeless tobacco: Never Used   Tobacco comment: Quit January 1980  Substance and Sexual Activity   Alcohol use: Yes    Comment: occasional   Drug use: No   Sexual activity: Not on file

## 2018-12-27 ENCOUNTER — Ambulatory Visit: Payer: Medicare Other | Admitting: Orthopaedic Surgery

## 2019-01-01 ENCOUNTER — Ambulatory Visit (INDEPENDENT_AMBULATORY_CARE_PROVIDER_SITE_OTHER): Payer: Medicare Other | Admitting: Orthopaedic Surgery

## 2019-01-01 ENCOUNTER — Other Ambulatory Visit: Payer: Self-pay

## 2019-01-01 ENCOUNTER — Encounter: Payer: Self-pay | Admitting: Orthopaedic Surgery

## 2019-01-01 DIAGNOSIS — Z96611 Presence of right artificial shoulder joint: Secondary | ICD-10-CM

## 2019-01-01 DIAGNOSIS — M25511 Pain in right shoulder: Secondary | ICD-10-CM

## 2019-01-01 MED ORDER — TRAMADOL HCL 50 MG PO TABS: 50.0000 mg | ORAL_TABLET | Freq: Four times a day (QID) | ORAL | 0 refills | Status: DC | PRN

## 2019-01-01 MED ORDER — METHYLPREDNISOLONE 4 MG PO TABS: ORAL_TABLET | ORAL | 0 refills | Status: DC

## 2019-01-01 NOTE — Progress Notes (Signed)
The patient is continuing to experience significant right shoulder pain.  We saw her back on December 2 and provided a steroid injection in her right shoulder subacromial space.  She said the injection was great when the shoulder was numb but then the pain came back and now she is just miserable by the end of the day.  She is a very active 73 year old female.  She says reaching above and behind her is causing severe pain in the right shoulder.  Her x-rays were unremarkable in terms of a well located shoulder with no acute injury.  I can still easily move her shoulder through range of motion passively on the right side but she has a lot of guarding with this.  She can abduct her shoulder and her shoulder is clinically well located.  At this point we will obtain an MRI due to the severity of her pain with her right shoulder so we can assess the rotator cuff or any other abnormality within the shoulder that is causing her to have the severe pain.  I will start her on a 6-day steroid taper orally and try some Voltaren gel and tramadol.  She knows to call for follow-up appointment when she has the MRI of her right shoulder.  All question concerns were answered and addressed.

## 2019-01-08 ENCOUNTER — Other Ambulatory Visit: Payer: Self-pay | Admitting: Neurology

## 2019-01-08 ENCOUNTER — Telehealth: Payer: Self-pay | Admitting: Neurology

## 2019-01-08 MED ORDER — AMPHETAMINE-DEXTROAMPHETAMINE 10 MG PO TABS: 10.0000 mg | ORAL_TABLET | Freq: Every day | ORAL | 0 refills | Status: DC

## 2019-01-08 NOTE — Telephone Encounter (Signed)
Pt is requesting a refill of amphetamine-dextroamphetamine (ADDERALL) 10 MG tablet, to be sent to [id number] - [place], [place] - [address]

## 2019-01-08 NOTE — Telephone Encounter (Signed)
I have routed this request to Dr Dohmeier for review. The pt is due for the medication and [place] registry was verified.  

## 2019-01-18 ENCOUNTER — Other Ambulatory Visit: Payer: Self-pay

## 2019-01-18 ENCOUNTER — Ambulatory Visit
Admission: RE | Admit: 2019-01-18 | Discharge: 2019-01-18 | Disposition: A | Discharge status: Home / Self Care | Payer: Medicare Other | Source: Ambulatory Visit | Attending: Orthopaedic Surgery | Admitting: Orthopaedic Surgery

## 2019-01-18 DIAGNOSIS — Z96611 Presence of right artificial shoulder joint: Secondary | ICD-10-CM

## 2019-01-18 DIAGNOSIS — M25511 Pain in right shoulder: Secondary | ICD-10-CM | POA: Diagnosis not present

## 2019-01-22 ENCOUNTER — Ambulatory Visit: Payer: Medicare Other | Admitting: Adult Health

## 2019-01-23 ENCOUNTER — Other Ambulatory Visit: Payer: Medicare Other

## 2019-01-25 ENCOUNTER — Other Ambulatory Visit: Payer: Medicare Other

## 2019-01-25 ENCOUNTER — Other Ambulatory Visit: Payer: Self-pay

## 2019-01-25 ENCOUNTER — Encounter: Payer: Self-pay | Admitting: Orthopaedic Surgery

## 2019-01-25 ENCOUNTER — Ambulatory Visit (INDEPENDENT_AMBULATORY_CARE_PROVIDER_SITE_OTHER): Payer: Medicare Other | Admitting: Orthopaedic Surgery

## 2019-01-25 DIAGNOSIS — G8929 Other chronic pain: Secondary | ICD-10-CM | POA: Diagnosis not present

## 2019-01-25 DIAGNOSIS — M25511 Pain in right shoulder: Secondary | ICD-10-CM | POA: Diagnosis not present

## 2019-01-25 NOTE — Progress Notes (Signed)
The patient comes in to go over an MRI of the right shoulder.  She does have the right shoulder pain and sometimes a grinding feeling.  She is 74 years old.  Subacromial injection with a steroid has not helped in the right shoulder.  On exam she does have a lot of the pain with rotation of the shoulder.  There is no blocks to rotation and I do not feel a significant grinding but is definitely painful for her.  The MRI of her right shoulder is reviewed with her.  She does have a moderate effusion of the right shoulder joint.  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  The rotator cuff is grossly intact.  I went over her findings with her and gave her a copy of the MRI report.  I have recommended an appointment with my partner Dr. Junius Roads.  I do feel that he would be able to evaluate her shoulder joint under ultrasound and provide an ultrasound-guided steroid injection in the right glenohumeral joint.  Hopefully he can aspirate any fluid if there is a moderate effusion that he detects under ultrasound as well.  The patient agrees with this treatment plan.  After she sees Dr. Junius Roads and has an intervention hopefully next week, he can then set her up for a follow-up appoint with me in about 2 to 3 weeks later.

## 2019-02-01 ENCOUNTER — Ambulatory Visit: Payer: Self-pay

## 2019-02-01 ENCOUNTER — Ambulatory Visit (INDEPENDENT_AMBULATORY_CARE_PROVIDER_SITE_OTHER): Payer: Medicare Other | Admitting: Family Medicine

## 2019-02-01 ENCOUNTER — Other Ambulatory Visit: Payer: Self-pay

## 2019-02-01 ENCOUNTER — Encounter: Payer: Self-pay | Admitting: Family Medicine

## 2019-02-01 DIAGNOSIS — M25511 Pain in right shoulder: Secondary | ICD-10-CM

## 2019-02-01 DIAGNOSIS — G8929 Other chronic pain: Secondary | ICD-10-CM

## 2019-02-01 DIAGNOSIS — Z96611 Presence of right artificial shoulder joint: Secondary | ICD-10-CM

## 2019-02-01 NOTE — Progress Notes (Signed)
Subjective: Patient is here for ultrasound-guided intra-articular right glenohumeral injection.  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  Subacromial injection unfortunately did not give much relief.  Objective: Full active range of motion with pain at the extremes.  Procedure: Ultrasound-guided right glenohumeral injection: After sterile prep with Betadine, injected 8 cc 1% lidocaine without epinephrine and 40 mg methylprednisolone using a 22-gauge spinal needle, passing the needle through approach into the glenohumeral joint.  She did have a small effusion but not enough to aspirate.  Injectate was seen filling the joint capsule.  She had very good immediate relief.  We will start taking glucosamine and turmeric as well.

## 2019-02-01 NOTE — Patient Instructions (Signed)
   Glucosamine Sulfate:  1,000 mg twice daily  Turmeric:  500 mg twice daily  Avoid sugar.

## 2019-02-06 ENCOUNTER — Telehealth: Payer: Self-pay

## 2019-02-06 ENCOUNTER — Other Ambulatory Visit: Payer: Self-pay | Admitting: Neurology

## 2019-02-06 NOTE — Telephone Encounter (Signed)
I have routed this request to Dr Dohmeier for review. The pt is due for the medication and [place] registry was verified.  

## 2019-02-06 NOTE — Telephone Encounter (Signed)
1) Medication(s) Requested (by name): °amphetamine-dextroamphetamine (ADDERALL) 10 MG tablet ° °2) Pharmacy of Choice: °[id number] - [place], [place] - [address]  °[address], [place] [address]  °

## 2019-02-07 MED ORDER — AMPHETAMINE-DEXTROAMPHETAMINE 10 MG PO TABS: 10.0000 mg | ORAL_TABLET | Freq: Every day | ORAL | 0 refills | Status: DC

## 2019-02-16 DIAGNOSIS — Z23 Encounter for immunization: Secondary | ICD-10-CM | POA: Diagnosis not present

## 2019-03-07 ENCOUNTER — Other Ambulatory Visit: Payer: Self-pay | Admitting: Neurology

## 2019-03-07 ENCOUNTER — Telehealth: Payer: Self-pay | Admitting: Neurology

## 2019-03-07 MED ORDER — AMPHETAMINE-DEXTROAMPHETAMINE 10 MG PO TABS: 10.0000 mg | ORAL_TABLET | Freq: Every day | ORAL | 0 refills | Status: DC

## 2019-03-07 NOTE — Telephone Encounter (Signed)
Pt is needing a refill on her amphetamine-dextroamphetamine (ADDERALL) 10 MG tablet sent to the [facility] on [address]

## 2019-03-07 NOTE — Telephone Encounter (Signed)
I have routed this request to Dr Dohmeier for review. The pt is due for the medication and [place] registry was verified.  

## 2019-03-09 DIAGNOSIS — Z23 Encounter for immunization: Secondary | ICD-10-CM | POA: Diagnosis not present

## 2019-03-26 ENCOUNTER — Encounter: Payer: Self-pay | Admitting: Adult Health

## 2019-03-28 ENCOUNTER — Encounter: Payer: Self-pay | Admitting: Adult Health

## 2019-03-28 ENCOUNTER — Other Ambulatory Visit: Payer: Self-pay

## 2019-03-28 ENCOUNTER — Ambulatory Visit (INDEPENDENT_AMBULATORY_CARE_PROVIDER_SITE_OTHER): Payer: Medicare Other | Admitting: Adult Health

## 2019-03-28 VITALS — BP 130/89 | HR 81 | Ht 66.0 in | Wt 125.0 lb

## 2019-03-28 DIAGNOSIS — G4733 Obstructive sleep apnea (adult) (pediatric): Secondary | ICD-10-CM | POA: Diagnosis not present

## 2019-03-28 DIAGNOSIS — E039 Hypothyroidism, unspecified: Secondary | ICD-10-CM | POA: Diagnosis not present

## 2019-03-28 DIAGNOSIS — R5383 Other fatigue: Secondary | ICD-10-CM | POA: Diagnosis not present

## 2019-03-28 DIAGNOSIS — Z9989 Dependence on other enabling machines and devices: Secondary | ICD-10-CM

## 2019-03-28 LAB — TSH: TSH: 1.4 (ref 0.41–5.90)
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
## 2019-03-28 NOTE — Patient Instructions (Addendum)
Your Plan:  Restart CPAP Continue Adderall If your symptoms worsen or you develop new symptoms please let us know.    Thank you for coming to see [name] at [facility]. I hope we have been able to provide you high quality care today.  You may receive a patient satisfaction survey over the next few weeks. We would appreciate your feedback and comments so that we may continue to improve ourselves and the health of our patients.

## 2019-03-28 NOTE — Progress Notes (Signed)
Order for mask refitting sent to [facility] via community message. Confirmation received that the order transmitted was successful.  

## 2019-03-29 LAB — THYROGLOBULIN ANTIBODIES (REFL): Thyroglobulin Antibody: 2.6

## 2019-03-29 LAB — PROLACTIN: Prolactin: 10.9

## 2019-04-06 ENCOUNTER — Other Ambulatory Visit: Payer: Self-pay | Admitting: Adult Health

## 2019-04-06 NOTE — Telephone Encounter (Signed)
Pt called needing a refill on her amphetamine-dextroamphetamine (ADDERALL) 10 MG tablet sent in to the [facility] on [address]

## 2019-04-09 ENCOUNTER — Telehealth: Payer: Self-pay | Admitting: Adult Health

## 2019-04-09 MED ORDER — AMPHETAMINE-DEXTROAMPHETAMINE 10 MG PO TABS: 10.0000 mg | ORAL_TABLET | Freq: Every day | ORAL | 0 refills | Status: DC

## 2019-04-09 NOTE — Telephone Encounter (Signed)
This was already done

## 2019-04-09 NOTE — Telephone Encounter (Signed)
Pt is needing a refill on her amphetamine-dextroamphetamine (ADDERALL) 10 MG tablet sent in to the [facility] on [address] 

## 2019-04-10 ENCOUNTER — Telehealth: Payer: Self-pay | Admitting: Neurology

## 2019-04-10 NOTE — Telephone Encounter (Signed)
Received a notification from [place] in regards to order placed and sent by Hedwig Morton on 03/28/19 visit,   "called pt to schedule on 03/19, 03/24, and 03/26, no answer lvm each time. I am voiding this order, just wanted to make you all aware." Pt will have to contact [facility] to make this apt to have mask refit completed.

## 2019-04-16 DIAGNOSIS — Z1272 Encounter for screening for malignant neoplasm of vagina: Secondary | ICD-10-CM | POA: Diagnosis not present

## 2019-04-16 DIAGNOSIS — Z779 Other contact with and (suspected) exposures hazardous to health: Secondary | ICD-10-CM | POA: Diagnosis not present

## 2019-04-16 DIAGNOSIS — N950 Postmenopausal bleeding: Secondary | ICD-10-CM | POA: Diagnosis not present

## 2019-04-16 DIAGNOSIS — N760 Acute vaginitis: Secondary | ICD-10-CM | POA: Diagnosis not present

## 2019-04-17 DIAGNOSIS — R197 Diarrhea, unspecified: Secondary | ICD-10-CM | POA: Diagnosis not present

## 2019-04-17 DIAGNOSIS — Z8639 Personal history of other endocrine, nutritional and metabolic disease: Secondary | ICD-10-CM | POA: Diagnosis not present

## 2019-04-17 DIAGNOSIS — L659 Nonscarring hair loss, unspecified: Secondary | ICD-10-CM | POA: Diagnosis not present

## 2019-04-17 DIAGNOSIS — Z808 Family history of malignant neoplasm of other organs or systems: Secondary | ICD-10-CM | POA: Diagnosis not present

## 2019-04-17 DIAGNOSIS — E039 Hypothyroidism, unspecified: Secondary | ICD-10-CM | POA: Diagnosis not present

## 2019-04-17 DIAGNOSIS — Z840 Family history of diseases of the skin and subcutaneous tissue: Secondary | ICD-10-CM | POA: Diagnosis not present

## 2019-04-17 DIAGNOSIS — Z8349 Family history of other endocrine, nutritional and metabolic diseases: Secondary | ICD-10-CM | POA: Diagnosis not present

## 2019-04-17 DIAGNOSIS — Z923 Personal history of irradiation: Secondary | ICD-10-CM | POA: Diagnosis not present

## 2019-04-17 DIAGNOSIS — E063 Autoimmune thyroiditis: Secondary | ICD-10-CM | POA: Diagnosis not present

## 2019-04-17 DIAGNOSIS — Z8261 Family history of arthritis: Secondary | ICD-10-CM | POA: Diagnosis not present

## 2019-04-30 DIAGNOSIS — N952 Postmenopausal atrophic vaginitis: Secondary | ICD-10-CM | POA: Diagnosis not present

## 2019-04-30 DIAGNOSIS — E237 Disorder of pituitary gland, unspecified: Secondary | ICD-10-CM | POA: Diagnosis not present

## 2019-04-30 DIAGNOSIS — N939 Abnormal uterine and vaginal bleeding, unspecified: Secondary | ICD-10-CM | POA: Diagnosis not present

## 2019-04-30 DIAGNOSIS — E221 Hyperprolactinemia: Secondary | ICD-10-CM | POA: Diagnosis not present

## 2019-05-06 ENCOUNTER — Emergency Department (HOSPITAL_COMMUNITY): Payer: Medicare Other

## 2019-05-06 ENCOUNTER — Other Ambulatory Visit: Payer: Self-pay

## 2019-05-06 ENCOUNTER — Encounter (HOSPITAL_COMMUNITY): Payer: Self-pay | Admitting: Emergency Medicine

## 2019-05-06 ENCOUNTER — Emergency Department (HOSPITAL_COMMUNITY)
Admission: EM | Admit: 2019-05-06 | Discharge: 2019-05-06 | Disposition: A | Discharge status: Home / Self Care | Payer: Medicare Other | Attending: Emergency Medicine | Admitting: Emergency Medicine

## 2019-05-06 DIAGNOSIS — S20211A Contusion of right front wall of thorax, initial encounter: Secondary | ICD-10-CM | POA: Insufficient documentation

## 2019-05-06 DIAGNOSIS — S060X1A Concussion with loss of consciousness of 30 minutes or less, initial encounter: Secondary | ICD-10-CM | POA: Diagnosis not present

## 2019-05-06 DIAGNOSIS — Y92012 Bathroom of single-family (private) house as the place of occurrence of the external cause: Secondary | ICD-10-CM | POA: Diagnosis not present

## 2019-05-06 DIAGNOSIS — S0990XA Unspecified injury of head, initial encounter: Secondary | ICD-10-CM

## 2019-05-06 DIAGNOSIS — S79911A Unspecified injury of right hip, initial encounter: Secondary | ICD-10-CM | POA: Diagnosis not present

## 2019-05-06 DIAGNOSIS — Y999 Unspecified external cause status: Secondary | ICD-10-CM | POA: Insufficient documentation

## 2019-05-06 DIAGNOSIS — S0101XA Laceration without foreign body of scalp, initial encounter: Secondary | ICD-10-CM | POA: Diagnosis not present

## 2019-05-06 DIAGNOSIS — Z87891 Personal history of nicotine dependence: Secondary | ICD-10-CM | POA: Diagnosis not present

## 2019-05-06 DIAGNOSIS — I10 Essential (primary) hypertension: Secondary | ICD-10-CM | POA: Diagnosis not present

## 2019-05-06 DIAGNOSIS — W1830XA Fall on same level, unspecified, initial encounter: Secondary | ICD-10-CM | POA: Insufficient documentation

## 2019-05-06 DIAGNOSIS — S7001XA Contusion of right hip, initial encounter: Secondary | ICD-10-CM | POA: Insufficient documentation

## 2019-05-06 DIAGNOSIS — R52 Pain, unspecified: Secondary | ICD-10-CM

## 2019-05-06 DIAGNOSIS — S3992XA Unspecified injury of lower back, initial encounter: Secondary | ICD-10-CM | POA: Diagnosis not present

## 2019-05-06 DIAGNOSIS — Y9389 Activity, other specified: Secondary | ICD-10-CM | POA: Insufficient documentation

## 2019-05-06 DIAGNOSIS — S299XXA Unspecified injury of thorax, initial encounter: Secondary | ICD-10-CM | POA: Diagnosis not present

## 2019-05-06 DIAGNOSIS — Z79899 Other long term (current) drug therapy: Secondary | ICD-10-CM | POA: Insufficient documentation

## 2019-05-06 DIAGNOSIS — R55 Syncope and collapse: Secondary | ICD-10-CM | POA: Diagnosis not present

## 2019-05-06 LAB — BASIC METABOLIC PANEL
Anion gap: 11 (ref 5–15)
BUN: 16 mg/dL (ref 8–23)
CO2: 26 mmol/L (ref 22–32)
Calcium: 8.7 mg/dL — ABNORMAL LOW (ref 8.9–10.3)
Chloride: 100 mmol/L (ref 98–111)
Creatinine, Ser: 0.74 mg/dL (ref 0.44–1.00)
GFR calc Af Amer: >60 mL/min (ref >60)
GFR calc non Af Amer: >60 mL/min (ref >60)
Glucose, Bld: 97 mg/dL (ref 70–99)
Potassium: 4.3 mmol/L (ref 3.5–5.1)
Sodium: 137 mmol/L (ref 135–145)

## 2019-05-06 LAB — URINALYSIS, ROUTINE W REFLEX MICROSCOPIC
Bilirubin Urine: NEGATIVE
Glucose, UA: 50 mg/dL — AB
Hgb urine dipstick: NEGATIVE
Ketones, ur: NEGATIVE mg/dL
Leukocytes,Ua: NEGATIVE
Nitrite: NEGATIVE
Protein, ur: NEGATIVE mg/dL
Specific Gravity, Urine: 1.017 (ref 1.005–1.030)
pH: 5 (ref 5.0–8.0)

## 2019-05-06 LAB — CBC WITH DIFFERENTIAL/PLATELET
Abs Immature Granulocytes: 0.02 10*3/uL (ref 0.00–0.07)
Basophils Absolute: 0 10*3/uL (ref 0.0–0.1)
Basophils Relative: 1 %
Eosinophils Absolute: 0.3 10*3/uL (ref 0.0–0.5)
Eosinophils Relative: 3 %
HCT: 36.5 % (ref 36.0–46.0)
Hemoglobin: 11.9 g/dL — ABNORMAL LOW (ref 12.0–15.0)
Immature Granulocytes: 0 %
Lymphocytes Relative: 12 %
Lymphs Abs: 1 10*3/uL (ref 0.7–4.0)
MCH: 32.5 pg (ref 26.0–34.0)
MCHC: 32.6 g/dL (ref 30.0–36.0)
MCV: 99.7 fL (ref 80.0–100.0)
Monocytes Absolute: 0.6 10*3/uL (ref 0.1–1.0)
Monocytes Relative: 8 %
Neutro Abs: 6.1 10*3/uL (ref 1.7–7.7)
Neutrophils Relative %: 76 %
Platelets: 234 10*3/uL (ref 150–400)
RBC: 3.66 MIL/uL — ABNORMAL LOW (ref 3.87–5.11)
RDW: 13.2 % (ref 11.5–15.5)
WBC: 7.9 10*3/uL (ref 4.0–10.5)
nRBC: 0 % (ref 0.0–0.2)

## 2019-05-06 MED ORDER — HYDROCODONE-ACETAMINOPHEN 5-325 MG PO TABS
2.0000 | ORAL_TABLET | Freq: Once | ORAL | Status: AC
  Administered 2019-05-06: 2 via ORAL
  Filled 2019-05-06: qty 2

## 2019-05-06 NOTE — ED Provider Notes (Signed)
[facility] EMERGENCY DEPARTMENT Provider Note   CSN: [id number] Arrival date & time: 05/06/19  1712     History Chief Complaint  Patient presents with  . Fall    Angela [name] is a 74 y.o. female.  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  Patient Friday evening believes she likely tripped and slipped in the bathroom falling hitting the back of her head with syncope and amnesia as she does not recall events.  Patient eventually able to get herself up and there is blood all over the floor from her scalp laceration.  Patient has had persistent pain in these areas since.  Patient denies any chest or abdominal pain.  No neurologic symptoms.  No blood thinner use.  Patient had urinary incontinence during the night which is new for her.        Past Medical History:  Diagnosis Date  . Chronic pain   . Complication of anesthesia   . Headache   . Hypertension   . Joint disorder   . PONV (postoperative nausea and vomiting)   . Porphyria [facility])     Patient Active Problem List   Diagnosis Date Noted  . Trochanteric bursitis, left hip 06/28/2017  . Chronic, continuous use of opioids 10/12/2016  . 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 Other retention of urine 10/12/2016  . Vaginal atrophy 10/12/2016  . 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 New onset headache 01/19/2016  . Excessive sleepiness 01/19/2016  . HYPERTENSION, BENIGN 03/10/2009  . HYPERTENSION, UNCONTROLLED 01/29/2009  . ARM PAIN, LEFT 01/29/2009    Past Surgical History:  Procedure Laterality Date  . ABDOMINAL HYSTERECTOMY     Age 41  . ANKLE SURGERY     x3  . BIOPSY  10/19/2018   Procedure: BIOPSY;  Surgeon: Ronnette Juniper, MD;  Location: [facility];  Service: Gastroenterology;;  . COLONOSCOPY WITH PROPOFOL N/A 10/19/2018   Procedure: COLONOSCOPY WITH PROPOFOL;  Surgeon: Ronnette Juniper, MD;   Location: [facility];  Service: Gastroenterology;  Laterality: N/A;  . DILATION AND CURETTAGE OF UTERUS     x 4  . ESOPHAGOGASTRODUODENOSCOPY (EGD) WITH PROPOFOL N/A 10/19/2018   Procedure: ESOPHAGOGASTRODUODENOSCOPY (EGD) WITH PROPOFOL;  Surgeon: Ronnette Juniper, MD;  Location: [facility];  Service: Gastroenterology;  Laterality: N/A;  . KNEE SURGERY     x 3  . MANDIBLE FRACTURE SURGERY     x 19  . POLYPECTOMY  10/19/2018   Procedure: POLYPECTOMY;  Surgeon: Ronnette Juniper, MD;  Location: [facility];  Service: Gastroenterology;;     OB History   No obstetric history on file.     Family History  Problem Relation Age of Onset  . Breast cancer Mother   . Rheum arthritis Mother   . Aneurysm Father   . Heart disease Father   . Throat cancer Father   . Melanoma Father   . Heart disease Brother   . Heart disease Brother   . Thyroid disease Brother   . Post-traumatic stress disorder Brother   . Rheum arthritis Brother   . Polymyositis Brother   . Obesity Brother     Social History   Tobacco Use  . Smoking status: Former Research scientist (life sciences)  . Smokeless tobacco: Never Used  . Tobacco comment: Quit January 1980  Substance Use Topics  . Alcohol use: Yes    Comment: occasional  . Drug use: No    Home Medications  Prior to Admission medications   Medication Sig Start Date End Date Taking? Authorizing Provider  amphetamine-dextroamphetamine (ADDERALL) 10 MG tablet Take 1-2 tablets (10-20 mg total) by mouth daily with breakfast. 04/09/19   Ward Givens, NP  buprenorphine (SUBUTEX) 8 MG SUBL Place 4 mg under the tongue 4 (four) times daily.     [provider]  Carboxymethylcellul-Glycerin (LUBRICATING EYE DROPS OP) Place 1 drop into both eyes 4 (four) times daily as needed (dry eyes).    [provider]  escitalopram (LEXAPRO) 20 MG tablet Take 20 mg by mouth every morning. 12/31/18   [provider]  furosemide (LASIX) 20 MG tablet Take 40-60 mg by mouth daily as  needed (fluid retention (feet/legs)).  01/11/16   [provider]  lisinopril-hydrochlorothiazide (PRINZIDE,ZESTORETIC) 10-12.5 MG tablet Take 1 tablet by mouth every evening.  01/17/18   [provider]  Multiple Vitamin (MULTIVITAMIN WITH MINERALS) TABS tablet Take 1 tablet by mouth 3 (three) times a week.     [provider]  thyroid (ARMOUR) 60 MG tablet Take 60 mg by mouth daily before breakfast.     [provider]    Allergies    Nsaids and Doxycycline  Review of Systems   Review of Systems  Constitutional: Negative for chills and fever.  HENT: Negative for congestion.   Eyes: Negative for visual disturbance.  Respiratory: Negative for shortness of breath.   Cardiovascular: Negative for chest pain.  Gastrointestinal: Negative for abdominal pain and vomiting.  Genitourinary: Negative for dysuria and flank pain.  Musculoskeletal: Negative for back pain, neck pain and neck stiffness.  Skin: Positive for wound. Negative for rash.  Neurological: Positive for syncope and headaches. Negative for light-headedness.    Physical Exam Updated Vital Signs BP (!) 133/95 (BP Location: Right Arm)   Pulse 79   Temp 98.6 F (37 C)   Resp 16   SpO2 99%   Physical Exam Vitals and nursing note reviewed.  Constitutional:      Appearance: She is well-developed.  HENT:     Head: Normocephalic.     Comments: Patient has healing with dried blood 1.5 cm laceration posterior scalp.  Patient has no tenderness in lower extremities. Eyes:     General:        Right eye: No discharge.        Left eye: No discharge.     Conjunctiva/sclera: Conjunctivae normal.  Neck:     Trachea: No tracheal deviation.  Cardiovascular:     Rate and Rhythm: Normal rate and regular rhythm.     Heart sounds: No murmur.  Pulmonary:     Effort: Pulmonary effort is normal.     Breath sounds: Normal breath sounds.  Abdominal:     General: There is no distension.     Palpations:  Abdomen is soft.     Tenderness: There is no abdominal tenderness. There is no guarding.  Musculoskeletal:        General: Tenderness and signs of injury present. No deformity.     Cervical back: Normal range of motion and neck supple.     Comments: Patient has mild tenderness right iliac crest, right mid lateral ribs.  No midline cervical thoracic or lumbar tenderness.  Full range of motion head neck.  Skin:    General: Skin is warm.     Capillary Refill: Capillary refill takes less than 2 seconds.     Findings: No rash.  Neurological:     Mental Status:  She is alert and oriented to person, place, and time.     Cranial Nerves: No cranial nerve deficit.     Motor: No weakness.  Psychiatric:        Mood and Affect: Mood normal.     ED Results / Procedures / Treatments   Labs (all labs ordered are listed, but only abnormal results are displayed) Labs Reviewed  CBC WITH DIFFERENTIAL/PLATELET - Abnormal; Notable for the following components:      Result Value   RBC 3.66 (*)    Hemoglobin 11.9 (*)    All other components within normal limits  BASIC METABOLIC PANEL - Abnormal; Notable for the following components:   Calcium 8.7 (*)    All other components within normal limits  URINALYSIS, ROUTINE W REFLEX MICROSCOPIC - Abnormal; Notable for the following components:   Glucose, UA 50 (*)    All other components within normal limits    EKG EKG Interpretation  Date/Time:  Sunday May 06 2019 20:05:41 EDT Ventricular Rate:  70 PR Interval:  124 QRS Duration: 92 QT Interval:  422 QTC Calculation: 455 R Axis:   81 Text Interpretation: Normal sinus rhythm Biatrial enlargement Incomplete right bundle branch block Abnormal ECG Confirmed by Elnora Morrison [phone]) on 05/06/2019 8:32:23 PM   Radiology DG Ribs Unilateral W/Chest Right  Result Date: 05/06/2019 CLINICAL DATA:  Status post recent fall. EXAM: RIGHT RIBS AND CHEST - 3+ VIEW COMPARISON:  None. FINDINGS: No acute fracture  or other bone lesions are seen involving the ribs. Chronic fourth and fifth right rib fractures are noted. There is no evidence of pneumothorax or pleural effusion. Both lungs are clear. Heart size and mediastinal contours are within normal limits. IMPRESSION: Chronic fourth and fifth right rib fractures. Electronically Signed   By: Virgina Norfolk M.D.   On: 05/06/2019 19:29   DG Lumbar Spine Complete  Result Date: 05/06/2019 CLINICAL DATA:  Status post recent fall. EXAM: LUMBAR SPINE - COMPLETE 4+ VIEW COMPARISON:  None. FINDINGS: There is no evidence of acute lumbar spine fracture. A chronic compression fracture deformity of the T12 vertebral body is seen. Alignment is normal. Mild endplate sclerosis is seen at the levels of L4-L5 and L5-S1. Intervertebral disc spaces are maintained. 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 Electronically Signed   By: Virgina Norfolk M.D.   On: 05/06/2019 19:31   CT Head Wo Contrast  Result Date: 05/06/2019 CLINICAL DATA:  Un witnessed syncopal episode, posterior scalp laceration EXAM: CT HEAD WITHOUT CONTRAST TECHNIQUE: Contiguous axial images were obtained from the base of the skull through the vertex without intravenous contrast. COMPARISON:  01/24/2016 FINDINGS: Brain: No acute infarct or hemorrhage. Lateral ventricles and midline structures are unremarkable. No acute extra-axial fluid collections. No mass effect. Vascular: No hyperdense vessel or unexpected calcification. Skull: Normal. Negative for fracture or focal lesion. Sinuses/Orbits: Sinuses are clear. Postsurgical changes are seen within the mandible, with extensive bilateral temporomandibular joint osteoarthritis. Other: None. IMPRESSION: 1. No acute intrathoracic process. Electronically Signed   By: Randa Ngo M.D.   On: 05/06/2019 18:45   DG Hip Unilat W or Wo Pelvis 2-3 Views Right  Result Date: 05/06/2019 CLINICAL DATA:  Status post recent fall. EXAM: DG  HIP (WITH OR WITHOUT PELVIS) 2-3V RIGHT COMPARISON:  None. FINDINGS: There is no evidence of hip fracture or dislocation. There is no evidence of arthropathy or other focal bone abnormality. IMPRESSION: Negative. Electronically Signed   By: Joyce Gross.D.  On: 05/06/2019 19:29    Procedures Procedures (including critical care time)  Medications Ordered in ED Medications  HYDROcodone-acetaminophen (NORCO/VICODIN) 5-325 MG per tablet 2 tablet (2 tablets Oral Given 05/06/19 1818)    ED Course  I have reviewed the triage vital signs and the nursing notes.  Pertinent labs & imaging results that were available during my care of the patient were reviewed by me and considered in my medical decision making (see chart for details).    MDM Rules/Calculators/A&P                      Patient presents for assessment of fall and head injury Friday evening.  Unsure if mechanical or not as she does not recall details.  Discussed she at least has a concussion, scalp laceration healing so no repair needed.  Plan to x-ray to look for signs of rib fracture, hip fracture and ensure no bleeding intracranially.  Blood work and urinalysis ordered due to urinary incontinence and syncope.  EKG pending.  Pain medicines given.  Blood work reviewed normal hemoglobin, normal electrolytes.  Urinalysis no signs of infection.  CT scan no acute fracture or bleeding on the head.  X-rays chronic fractures but no acute fractures. Chronic 4th and 5th rib fx on xray right.  Spirometry for home.  Patient stable for outpatient follow-up. Final Clinical Impression(s) / ED Diagnoses Final diagnoses:  Concussion with loss of consciousness <= 30 min, initial encounter  Acute head injury, initial encounter  Rib contusion, right, initial encounter  Contusion of right hip, initial encounter  Scalp laceration, initial encounter    Rx / DC Orders ED Discharge Orders    None       Elnora Morrison, MD 05/06/19  2037

## 2019-05-06 NOTE — ED Notes (Signed)
Pt verbalized understanding of discharge instructions. Follow up care, pain management, and use of incentive spirometer reviewed. Pt ambulated independently to lobby.

## 2019-05-06 NOTE — Discharge Instructions (Addendum)
Follow-up with your primary doctor this week.  Return for new or worsening symptoms. Tylenol as needed for pain. Use spirometer 2 to 3 hours while awake to help with your breathing and rib injuries. Use ice as needed.

## 2019-05-06 NOTE — ED Triage Notes (Signed)
Pt states she fell in bathroom Friday.  Believes she slipped.  States she hit back of head on tile wall and was bleeding.  C/o R rib pain, R hip pain, and lower back pain.  ? LOC.   States she went to bed and was incontinent of urine.  No blood thinner.

## 2019-05-09 ENCOUNTER — Other Ambulatory Visit: Payer: Self-pay | Admitting: Adult Health

## 2019-05-09 MED ORDER — AMPHETAMINE-DEXTROAMPHETAMINE 10 MG PO TABS: 10.0000 mg | ORAL_TABLET | Freq: Every day | ORAL | 0 refills | Status: DC

## 2019-05-09 NOTE — Telephone Encounter (Signed)
Pt has called for a refill on her amphetamine-dextroamphetamine (ADDERALL) 10 MG tablet [facility] [phone]

## 2019-05-09 NOTE — Addendum Note (Signed)
Addended by: Brandon Melnick on: 05/09/2019 03:38 PM   Modules accepted: Orders

## 2019-05-15 ENCOUNTER — Encounter: Payer: Self-pay | Admitting: Orthopaedic Surgery

## 2019-05-15 ENCOUNTER — Other Ambulatory Visit: Payer: Self-pay

## 2019-05-15 ENCOUNTER — Ambulatory Visit (INDEPENDENT_AMBULATORY_CARE_PROVIDER_SITE_OTHER): Payer: Medicare Other | Admitting: Orthopaedic Surgery

## 2019-05-15 DIAGNOSIS — M545 Low back pain, unspecified: Secondary | ICD-10-CM

## 2019-05-15 DIAGNOSIS — M4807 Spinal stenosis, lumbosacral region: Secondary | ICD-10-CM

## 2019-05-15 NOTE — Progress Notes (Signed)
Office Visit Note   Patient: Angela [name]           Date of Birth: June 25, 1945           MRN: [id number] Visit Date: 05/15/2019              Requested by: Janie Morning, DO [address],  [place] [address] PCP: Janie Morning, DO   Assessment & Plan: Visit Diagnoses:  1. Acute right-sided low back pain without sciatica     Plan: Given the patient's continued low back pain and a recent fall with a T12 compression fracture that may be subacute, a MRI of the lumbar spine is warranted at this point to assess for stenosis and nerve compression.  She will continue activity modification and anti-inflammatories and we will obtain this MRI and see her back in follow-up to go over the MRI of her lumbar spine.  Follow-Up Instructions: Return in about 2 weeks (around 05/29/2019).   Orders:  No orders of the defined types were placed in this encounter.  No orders of the defined types were placed in this encounter.     Procedures: No procedures performed   Clinical Data: No additional findings.   Subjective: Chief Complaint  Patient presents with  . Lower Back - Pain  . Right Hip - Pain  The patient comes in today with acute low back pain mainly to the right side and some hip pain after mechanical fall.  She is 74 years old.  We have seen her in the past.  The back pain has been off and on for years but now has gotten significantly worse after this fall.  She was seen in the emergency room and they x-rayed her lumbar spine and her hip and I can review these.  She is walking without an assistive device.  She is a thin and active individual.  She does have a history of right-sided rib fractures.  She denies any radicular symptoms going down her right leg.  Most of her pain is to the low back to the right side.  She has had no other acute change in medical status.  This was accidentally tripping over a vacuum cleaner at home.  HPI  Review of Systems She currently  denies any headache, chest pain, shortness of breath, fever, chills, nausea, vomiting  Objective: Vital Signs: There were no vitals taken for this visit.  Physical Exam She is alert and oriented x3 and in no acute distress Ortho Exam Examination of the right hip is normal with fluid and full range of motion no pain.  Examination of her low back does show midline tenderness to the upper lumbar spine but also paraspinal tenderness to the right side. Specialty Comments:  No specialty comments available.  Imaging: No results found. Plain films are independently reviewed of the lumbar spine.  There is a T12 compression fracture that is seen.  It is read as a chronic fracture.  I did look at films from 2019 of her lumbar spine.  It is difficult to tell whether there was a small compression deformity then.  It does appear to be slightly worse and it is hard to moderate to tell if this is acute versus subacute.  PMFS History: Patient Active Problem List   Diagnosis Date Noted  . Trochanteric bursitis, left hip 06/28/2017  . Chronic, continuous use of opioids 10/12/2016  . 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 Other retention of urine 10/12/2016  .  Vaginal atrophy 10/12/2016  . 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 New onset headache 01/19/2016  . Excessive sleepiness 01/19/2016  . HYPERTENSION, BENIGN 03/10/2009  . HYPERTENSION, UNCONTROLLED 01/29/2009  . ARM PAIN, LEFT 01/29/2009   Past Medical History:  Diagnosis Date  . Chronic pain   . Complication of anesthesia   . Headache   . Hypertension   . Joint disorder   . PONV (postoperative nausea and vomiting)   . Porphyria ([place])     Family History  Problem Relation Age of Onset  . Breast cancer Mother   . Rheum arthritis Mother   . Aneurysm Father   . Heart disease Father   . Throat cancer Father   . Melanoma Father   . Heart disease Brother   . Heart disease Brother   . Thyroid disease Brother   . Post-traumatic stress  disorder Brother   . Rheum arthritis Brother   . Polymyositis Brother   . Obesity Brother     Past Surgical History:  Procedure Laterality Date  . ABDOMINAL HYSTERECTOMY     Age 28  . ANKLE SURGERY     x3  . BIOPSY  10/19/2018   Procedure: BIOPSY;  Surgeon: Ronnette Juniper, MD;  Location: [facility];  Service: Gastroenterology;;  . COLONOSCOPY WITH PROPOFOL N/A 10/19/2018   Procedure: COLONOSCOPY WITH PROPOFOL;  Surgeon: Ronnette Juniper, MD;  Location: [facility];  Service: Gastroenterology;  Laterality: N/A;  . DILATION AND CURETTAGE OF UTERUS     x 4  . ESOPHAGOGASTRODUODENOSCOPY (EGD) WITH PROPOFOL N/A 10/19/2018   Procedure: ESOPHAGOGASTRODUODENOSCOPY (EGD) WITH PROPOFOL;  Surgeon: Ronnette Juniper, MD;  Location: [facility];  Service: Gastroenterology;  Laterality: N/A;  . KNEE SURGERY     x 3  . MANDIBLE FRACTURE SURGERY     x 19  . POLYPECTOMY  10/19/2018   Procedure: POLYPECTOMY;  Surgeon: Ronnette Juniper, MD;  Location: [facility];  Service: Gastroenterology;;   Social History   Occupational History  . Occupation: Part-time Secondary school teacher  Tobacco Use  . Smoking status: Former Research scientist (life sciences)  . Smokeless tobacco: Never Used  . Tobacco comment: Quit January 1980  Substance and Sexual Activity  . Alcohol use: Yes    Comment: occasional  . Drug use: No  . Sexual activity: Not on file

## 2019-05-24 ENCOUNTER — Ambulatory Visit
Admission: RE | Admit: 2019-05-24 | Discharge: 2019-05-24 | Disposition: A | Discharge status: Home / Self Care | Payer: Medicare Other | Source: Ambulatory Visit | Attending: Orthopaedic Surgery | Admitting: Orthopaedic Surgery

## 2019-05-24 DIAGNOSIS — M4807 Spinal stenosis, lumbosacral region: Secondary | ICD-10-CM

## 2019-05-24 DIAGNOSIS — M48061 Spinal stenosis, lumbar region without neurogenic claudication: Secondary | ICD-10-CM | POA: Diagnosis not present

## 2019-05-25 ENCOUNTER — Telehealth: Payer: Self-pay

## 2019-05-25 NOTE — Telephone Encounter (Signed)
Received a call from Angela [name] states she has a STAT call report for MRI. She will be faxing over report to  [phone]. Report should be in Epic soon.   "Acute burst fx of T12"

## 2019-05-29 ENCOUNTER — Ambulatory Visit: Payer: Medicare Other | Admitting: Orthopaedic Surgery

## 2019-06-03 ENCOUNTER — Other Ambulatory Visit: Payer: Medicare Other

## 2019-06-05 ENCOUNTER — Other Ambulatory Visit: Payer: Self-pay

## 2019-06-05 ENCOUNTER — Ambulatory Visit (INDEPENDENT_AMBULATORY_CARE_PROVIDER_SITE_OTHER): Payer: Medicare Other | Admitting: Orthopaedic Surgery

## 2019-06-05 ENCOUNTER — Encounter: Payer: Self-pay | Admitting: Orthopaedic Surgery

## 2019-06-05 DIAGNOSIS — S22081A Stable burst fracture of T11-T12 vertebra, initial encounter for closed fracture: Secondary | ICD-10-CM | POA: Diagnosis not present

## 2019-06-05 NOTE — Progress Notes (Signed)
The patient is an active 74 year old female who is following up after having a MRI of her lumbar spine.  She fell around April 23 or 24 and went to the emergency room with significant back pain.  When we saw her in the office we did recognize it was at least a T12 fracture that look like a compression fracture.  We sent her for an MRI for better evaluation of this.  The MRI did show burst fracture with some retropulsion of bony fragments.  She comes in today stating that she is only having some mild back pain at this standpoint and denies any radicular symptoms other than some slight numbness in the toes on both feet.  She denies any weakness in her legs.  She is not walking with any type of assistive device either.  On examination there is pain to palpation over the lower thoracic and upper lumbar spine.  She has actually pretty good flexion and extension of her spine with minimal discomfort.  She has 5 out of 5 strength of the bilateral lower extremities and no decrease sensation in any dermatomal my exam today.  I did go over her plain films and again the MRI of her lumbar spine with her looking at the images.  Right now she would prefer no other intervention given the fact that clinically she is doing well.  I would like to see her back in just 2 weeks.  I would like a repeat AP and lateral of the lumbar spine and then I would like to have flexion and extension views of lumbar spine.  All questions and concerns were answered and addressed.  At some point we will set her up for another intra-articular injection in her right shoulder joint under ultrasound by Dr. Junius Roads given her moderate arthritis of the glenohumeral joint and continued pain.  He last injected this in early January of this year.

## 2019-06-13 ENCOUNTER — Other Ambulatory Visit: Payer: Self-pay | Admitting: Adult Health

## 2019-06-13 DIAGNOSIS — L821 Other seborrheic keratosis: Secondary | ICD-10-CM | POA: Diagnosis not present

## 2019-06-13 DIAGNOSIS — N9089 Other specified noninflammatory disorders of vulva and perineum: Secondary | ICD-10-CM | POA: Diagnosis not present

## 2019-06-13 DIAGNOSIS — N958 Other specified menopausal and perimenopausal disorders: Secondary | ICD-10-CM | POA: Diagnosis not present

## 2019-06-13 DIAGNOSIS — M816 Localized osteoporosis [Lequesne]: Secondary | ICD-10-CM | POA: Diagnosis not present

## 2019-06-13 MED ORDER — AMPHETAMINE-DEXTROAMPHETAMINE 10 MG PO TABS: 10.0000 mg | ORAL_TABLET | Freq: Every day | ORAL | 0 refills | Status: DC

## 2019-06-13 NOTE — Telephone Encounter (Signed)
1) Medication(s) Requested (by name): amphetamine-dextroamphetamine (ADDERALL) 10 MG tablet   2) Pharmacy of Choice:  [facility] [id number] - [place], [place]  [place],

## 2019-06-19 ENCOUNTER — Other Ambulatory Visit: Payer: Self-pay

## 2019-06-19 ENCOUNTER — Ambulatory Visit (INDEPENDENT_AMBULATORY_CARE_PROVIDER_SITE_OTHER): Payer: Medicare Other

## 2019-06-19 ENCOUNTER — Ambulatory Visit (INDEPENDENT_AMBULATORY_CARE_PROVIDER_SITE_OTHER): Payer: Medicare Other | Admitting: Orthopaedic Surgery

## 2019-06-19 ENCOUNTER — Encounter: Payer: Self-pay | Admitting: Orthopaedic Surgery

## 2019-06-19 DIAGNOSIS — S22081A Stable burst fracture of T11-T12 vertebra, initial encounter for closed fracture: Secondary | ICD-10-CM

## 2019-06-19 DIAGNOSIS — M4807 Spinal stenosis, lumbosacral region: Secondary | ICD-10-CM

## 2019-06-19 NOTE — Progress Notes (Signed)
HPI: Mrs. Mctigue returns today follow-up of her her back pain.  She states that her back pain is actually becoming worse.  Again she sustained a T12 burst fracture it on April 23 April 24th.  States her back pains became worse over the last week.  She is having trouble sleeping trouble getting up and down due to the pain.  She taken Tylenol without any relief.  She is in pain management due to chronic jaw pain.  She does note some numbness in the feet new since the injury.  She has had no saddle anesthesia like symptoms.  When asked about bowel or bladder incontinence.  She states that initially for the first couple nights up to a week she had some urinary and bowel incontinence but this is now resolved.  Review of systems: Please see HPI.  Physical exam: General well-developed well-nourished female in no acute distress mood and affect appropriate.  Lower extremities.  She has bilateral 5 strength throughout the lower extremities against resistance.  Negative straight leg raise bilaterally.  Tenderness over the lower thoracic and upper lumbar spinal column.  Tenderness right paraspinous region upper lumbar region.  Subjective decreased sensation right greater than left foot over the dorsal aspect of both feet and the plantar aspect of the metatarsal head region of both feet.  Radiographs: Flexion-extension lateral AP view of the lumbar spine shows compression fracture of T12 obscured by over lying lung tissue.  No sclerotic activity.  No other acute findings.  Impression: T12 burst fracture  Plan: At this point time given patient's worsening back pain will refer to neurosurgery.  Have her follow-up with Dr. Ninfa Linden as needed.  In regards to her right shoulder which she has known glenoid arthritic changes moderate, we will refer her to Dr. Junius Roads for a intra-articular injection in the future she will call our office when she is ready to proceed with this.  Questions were encouraged and answered at length.   Explained to her about the pain medication she will talk to pain management physician about possibly changing her medication regimen.

## 2019-07-06 ENCOUNTER — Other Ambulatory Visit: Payer: Self-pay | Admitting: Neurosurgery

## 2019-07-06 DIAGNOSIS — S22081A Stable burst fracture of T11-T12 vertebra, initial encounter for closed fracture: Secondary | ICD-10-CM

## 2019-07-06 LAB — CORTISOL: Cortisol: 5.8

## 2019-07-06 LAB — ACTH: ACTH: 7.9

## 2019-07-06 LAB — PROLACTIN: Prolactin: 27.8

## 2019-07-06 LAB — THYROID PEROXIDASE ANTIBODY: Thyroid Peroxidase Ab: 141

## 2019-07-09 ENCOUNTER — Ambulatory Visit (INDEPENDENT_AMBULATORY_CARE_PROVIDER_SITE_OTHER): Payer: Medicare Other | Admitting: Endocrinology

## 2019-07-09 ENCOUNTER — Other Ambulatory Visit: Payer: Self-pay

## 2019-07-09 ENCOUNTER — Encounter: Payer: Self-pay | Admitting: Endocrinology

## 2019-07-09 DIAGNOSIS — E221 Hyperprolactinemia: Secondary | ICD-10-CM

## 2019-07-09 DIAGNOSIS — M810 Age-related osteoporosis without current pathological fracture: Secondary | ICD-10-CM

## 2019-07-09 HISTORY — DX: Age-related osteoporosis without current pathological fracture: M81.0

## 2019-07-09 HISTORY — DX: Hyperprolactinemia: E22.1

## 2019-07-09 LAB — LUTEINIZING HORMONE: LH: 0.21 m[IU]/mL

## 2019-07-09 LAB — FOLLICLE STIMULATING HORMONE: FSH: 1.8 m[IU]/mL

## 2019-07-09 NOTE — Patient Instructions (Addendum)
Blood tests are requested for you today.  We'll let you know about the results.  If the blood tests are the same,I'll prescribe for you a pill to lower the prolactin.  Please come back for a follow-up appointment in 2 months.

## 2019-07-09 NOTE — Progress Notes (Signed)
Subjective:    Patient ID: Angela [name], female    DOB: 1945/10/04, 74 y.o.   MRN: [id number]  HPI Pt is referred by Dr Cletis Media, for hyperprolactinemia.  Pt had menarche at age 77.  She has always had irregular menses. She is G4P1.  She took oral contraceptives age [age]. She had TSH at age 63.  was noted to have an elevated prolactin in 2020.  She denies the following: excessive exercise, antipsychotics, brain XRT, brain surgery, cirrhosis,  seizures, hirsutism, renal dz, zoster, brain injury, or chest wall injury.  She intermittently takes opiates.  She was recently dx'ed with osteoporosis.   Past Medical History:  Diagnosis Date  . Chronic pain   . Complication of anesthesia   . Headache   . Hypertension   . Joint disorder   . PONV (postoperative nausea and vomiting)   . 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 ABDOMINAL HYSTERECTOMY     Age 41  . ANKLE SURGERY     x3  . BIOPSY  10/19/2018   Procedure: BIOPSY;  Surgeon: Ronnette Juniper, MD;  [facility];  Service: Gastroenterology;;  . COLONOSCOPY WITH PROPOFOL N/A 10/19/2018   Procedure: COLONOSCOPY WITH PROPOFOL;  Surgeon: Ronnette Juniper, MD;  [facility];  Service: Gastroenterology;  Laterality: N/A;  . DILATION AND CURETTAGE OF UTERUS     x 4  . ESOPHAGOGASTRODUODENOSCOPY (EGD) WITH PROPOFOL N/A 10/19/2018   Procedure: ESOPHAGOGASTRODUODENOSCOPY (EGD) WITH PROPOFOL;  Surgeon: Ronnette Juniper, MD;  [facility];  Service: Gastroenterology;  Laterality: N/A;  . KNEE SURGERY     x 3  . MANDIBLE FRACTURE SURGERY     x 19  . POLYPECTOMY  10/19/2018   Procedure: POLYPECTOMY;  Surgeon: Ronnette Juniper, MD;  Location: [facility];  Service: Gastroenterology;;    Social History   Socioeconomic History  . Marital status: Single    Spouse name: Not on file  . Number of children: 1  . Years of education: Bachelors  . Highest education level: Not on file  Occupational History    . Occupation: Part-time Secondary school teacher  Tobacco Use  . Smoking status: Former Research scientist (life sciences)  . Smokeless tobacco: Never Used  . Tobacco comment: Quit January 1980  Vaping Use  . Vaping Use: Never used  Substance and Sexual Activity  . Alcohol use: Yes    Comment: occasional  . Drug use: No  . Sexual activity: Not on file  Other Topics Concern  . Not on file  Social History Narrative   Lives at home alone.   Right-handed.   2 cups caffeine per day.   Social Determinants of Health   Financial Resource Strain:   . Difficulty of Paying Living Expenses:   Food Insecurity:   . Worried About Charity fundraiser in the Last Year:   . Arboriculturist in the Last Year:   Transportation Needs:   . Film/video editor (Medical):   Marland Kitchen Lack of Transportation (Non-Medical):   Physical Activity:   . Days of Exercise per Week:   . Minutes of Exercise per Session:   Stress:   . Feeling of Stress :   Social Connections:   . Frequency of Communication with Friends and Family:   . Frequency of Social Gatherings with Friends and Family:   . Attends Religious Services:   . Active Member of Clubs or Organizations:   . Attends Archivist Meetings:   .  Marital Status:   Intimate Partner Violence:   . Fear of Current or Ex-Partner:   . Emotionally Abused:   Marland Kitchen Physically Abused:   . Sexually Abused:     Current Outpatient Medications on File Prior to Visit  Medication Sig Dispense Refill  . amphetamine-dextroamphetamine (ADDERALL) 10 MG tablet Take 1-2 tablets (10-20 mg total) by mouth daily with breakfast. 60 tablet 0  . bisacodyl (DULCOLAX) 5 MG EC tablet Take 5 mg by mouth daily as needed for moderate constipation.    . buprenorphine (SUBUTEX) 8 MG SUBL Place 4 mg under the tongue See admin instructions. Take 6 times daily as needed    . Estradiol 10 MCG TABS vaginal tablet Place 1 tablet vaginally 3 (three) times a week.     . furosemide (LASIX) 20 MG tablet Take 40-60 mg by  mouth daily as needed (fluid retention (feet/legs)).   0  . lisinopril-hydrochlorothiazide (PRINZIDE,ZESTORETIC) 10-12.5 MG tablet Take 1 tablet by mouth every evening.     . potassium chloride (MICRO-K) 10 MEQ CR capsule Take 10 mEq by mouth daily.    Marland Kitchen thyroid (ARMOUR) 60 MG tablet Take 60 mg by mouth daily before breakfast.     No current facility-administered medications on file prior to visit.    Allergies  Allergen Reactions  . 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 Aspirin   . Doxycycline Nausea And Vomiting and Other (See Comments)    "blisters on feet"    Family History  Problem Relation Age of Onset  . Breast cancer Mother   . Rheum arthritis Mother   . Aneurysm Father   . Heart disease Father   . Throat cancer Father   . Melanoma Father   . Heart disease Brother   . Heart disease Brother   . Thyroid disease Brother   . Post-traumatic stress disorder Brother   . Rheum arthritis Brother   . Polymyositis Brother   . Obesity Brother     BP 100/70   Pulse 85   Ht 5' 5.5" (1.664 m)   Wt 116 lb 6.4 oz (52.8 kg)   SpO2 98%   BMI 19.08 kg/m    Review of Systems denies polyuria, loss of smell, visual loss, and change in facial appearance.  She has chronic headache.    Objective:   Physical Exam VS: see vs page GEN: no distress HEAD: head: no deformity eyes: no periorbital swelling, no proptosis external nose and ears are normal NECK: supple, thyroid is not enlarged CHEST WALL: no deformity LUNGS: clear to auscultation CV: reg rate and rhythm, no murmur.  MUSCULOSKELETAL: muscle bulk and strength are grossly normal.  no obvious joint swelling.  gait is normal and steady EXTEMITIES: no deformity.  no edema PULSES: no carotid bruit NEURO:  cn 2-12 grossly intact.   readily moves all 4's.  sensation is intact to touch on all 4's SKIN:  Normal texture  and temperature.  No rash or suspicious lesion is visible.   NODES:  None palpable at the neck PSYCH: alert, well-oriented.  Does not appear anxious nor depressed.     MRI (2018): The pituitary gland and optic chiasm appear normal.     Lab Results  Component Value Date   TSH 1.40 03/28/2019   THYROIDAB 141 03/28/2019   Lab Results  Component Value Date   CREATININE 0.74 05/06/2019  BUN 16 05/06/2019   NA 137 05/06/2019   K 4.3 05/06/2019   CL 100 05/06/2019   CO2 26 05/06/2019   I have reviewed outside records, and summarized: Pt was noted to have elevated prolactin, and referred here.  She was rx'ed E2 for sxs  Prolactin=28 FSH=4.4 LH=0.3 ACTH=8 Cortisol=5    Assessment & Plan:  Hyperprolactinemia, new to me: poss due to meds. Low gonadotropins: prob due to meds.  Osteoporosis.  This would be this indication for rx of elev prolactin.   Patient Instructions  Blood tests are requested for you today.  We'll let you know about the results.  If the blood tests are the same,I'll prescribe for you a pill to lower the prolactin.  Please come back for a follow-up appointment in 2 months.

## 2019-07-10 ENCOUNTER — Ambulatory Visit: Payer: Self-pay

## 2019-07-10 ENCOUNTER — Encounter: Payer: Self-pay | Admitting: Family Medicine

## 2019-07-10 ENCOUNTER — Ambulatory Visit (INDEPENDENT_AMBULATORY_CARE_PROVIDER_SITE_OTHER): Payer: Medicare Other | Admitting: Family Medicine

## 2019-07-10 DIAGNOSIS — G8929 Other chronic pain: Secondary | ICD-10-CM

## 2019-07-10 DIAGNOSIS — M25511 Pain in right shoulder: Secondary | ICD-10-CM

## 2019-07-10 LAB — PROLACTIN: Prolactin: 8.8 ng/mL

## 2019-07-10 NOTE — Progress Notes (Signed)
Subjective: Patient is here for ultrasound-guided intra-articular right glenohumeral injection.  Last injection in January gave excellent relief lasting several months.  This pain seems similar, but it radiates all the way down to her hand.  She has a deep aching pain.  She has pain in the trapezius area as well  Objective:  Full active ROM.  Procedure: Ultrasound-guided right glenohumeral injection: After sterile prep with Betadine, injected 8 cc 1% lidocaine without epinephrine and 40 mg methylprednisolone using a 22-gauge spinal needle, passing the needle from posterior approach into the glenohumeral joint.  If this doesn't give lasting relief, we will order nerve conduction studies.

## 2019-07-11 ENCOUNTER — Other Ambulatory Visit: Payer: Self-pay | Admitting: Adult Health

## 2019-07-11 MED ORDER — AMPHETAMINE-DEXTROAMPHETAMINE 10 MG PO TABS: 10.0000 mg | ORAL_TABLET | Freq: Every day | ORAL | 0 refills | Status: DC

## 2019-07-11 NOTE — Addendum Note (Signed)
Addended by: Brandon Melnick on: 07/11/2019 11:32 AM   Modules accepted: Orders

## 2019-07-11 NOTE — Telephone Encounter (Signed)
Pt is calling for a refill on her amphetamine-dextroamphetamine (ADDERALL) 10 MG tablet to [facility] #[id number]

## 2019-07-23 ENCOUNTER — Other Ambulatory Visit: Payer: Self-pay

## 2019-07-23 ENCOUNTER — Ambulatory Visit
Admission: RE | Admit: 2019-07-23 | Discharge: 2019-07-23 | Disposition: A | Discharge status: Home / Self Care | Payer: Medicare Other | Source: Ambulatory Visit | Attending: Neurosurgery | Admitting: Neurosurgery

## 2019-07-23 DIAGNOSIS — S22081A Stable burst fracture of T11-T12 vertebra, initial encounter for closed fracture: Secondary | ICD-10-CM

## 2019-07-25 ENCOUNTER — Encounter: Payer: Self-pay | Admitting: Family Medicine

## 2019-07-25 ENCOUNTER — Other Ambulatory Visit: Payer: Self-pay

## 2019-07-25 ENCOUNTER — Ambulatory Visit (INDEPENDENT_AMBULATORY_CARE_PROVIDER_SITE_OTHER): Payer: Medicare Other | Admitting: Family Medicine

## 2019-07-25 DIAGNOSIS — K589 Irritable bowel syndrome without diarrhea: Secondary | ICD-10-CM

## 2019-07-25 DIAGNOSIS — E079 Disorder of thyroid, unspecified: Secondary | ICD-10-CM | POA: Insufficient documentation

## 2019-07-25 DIAGNOSIS — N362 Urethral caruncle: Secondary | ICD-10-CM

## 2019-07-25 DIAGNOSIS — R200 Anesthesia of skin: Secondary | ICD-10-CM | POA: Diagnosis not present

## 2019-07-25 DIAGNOSIS — M79601 Pain in right arm: Secondary | ICD-10-CM | POA: Diagnosis not present

## 2019-07-25 DIAGNOSIS — G894 Chronic pain syndrome: Secondary | ICD-10-CM | POA: Insufficient documentation

## 2019-07-25 DIAGNOSIS — F900 Attention-deficit hyperactivity disorder, predominantly inattentive type: Secondary | ICD-10-CM | POA: Insufficient documentation

## 2019-07-25 HISTORY — DX: Attention-deficit hyperactivity disorder, predominantly inattentive type: F90.0

## 2019-07-25 HISTORY — DX: Irritable bowel syndrome, unspecified: K58.9

## 2019-07-25 HISTORY — DX: Disorder of thyroid, unspecified: E07.9

## 2019-07-25 HISTORY — DX: Urethral caruncle: N36.2

## 2019-07-25 NOTE — Progress Notes (Signed)
Office Visit Note   Patient: Angela [name]           Date of Birth: 07/02/1945           MRN: [id number] Visit Date: 07/25/2019 Requested by: Janie Morning, DO [address],  [place] [address] PCP: Janie Morning, DO  Subjective: Chief Complaint  Patient presents with  . Right Arm - Pain, Follow-up    The cortisone injection 07/10/19 eased the pain some in the arm, but continues to hurt down to her wrist    HPI: She is here with persistent right arm pain.  Glenohumeral injection are not helping.  Her pain starts in the triceps area and radiates down the arm into the hand.  Is a constant severe pain, like "torture".  She is not having any significant neck pain.  She gets occasional numbness and tingling in her hand.  Medications are not helping.               ROS:   All other systems were reviewed and are negative.  Objective: Vital Signs: There were no vitals taken for this visit.  Physical Exam:  General:  Alert and oriented, in no acute distress. Pulm:  Breathing unlabored. Psy:  Normal mood, congruent affect. Skin: No rash Right arm: Full range of motion of her elbow and wrist.  Good range of motion of the shoulder.  Biceps, triceps, wrist and intrinsic hand strength are normal.  She has a positive Tinel's at the carpal tunnel and a positive Tinel's at the ulnar groove.  Negative ulnar compression at the elbow, equivocal Phalen's test.  Imaging: No results found.  Assessment & Plan: 1.  Persistent right arm pain, question entrapment neuropathy -We will proceed with nerve studies.  Could contemplate MRI scanning depending on the results.     Procedures: No procedures performed  No notes on file     PMFS History: Patient Active Problem List   Diagnosis Date Noted  . Attention deficit hyperactivity disorder, predominantly inattentive type 07/25/2019  . Chronic pain syndrome 07/25/2019  . Irritable bowel syndrome 07/25/2019  . Urethral  caruncle 07/25/2019  . Thyroid dysfunction 07/25/2019  . Osteoporosis 07/09/2019  . Hyperprolactinemia ([place]) 07/09/2019  . Trochanteric bursitis, left hip 06/28/2017  . Chronic, continuous use of opioids 10/12/2016  . 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 Other retention of urine 10/12/2016  . Vaginal atrophy 10/12/2016  . 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 New onset headache 01/19/2016  . Excessive sleepiness 01/19/2016  . HYPERTENSION, BENIGN 03/10/2009  . HYPERTENSION, UNCONTROLLED 01/29/2009  . ARM PAIN, LEFT 01/29/2009   Past Medical History:  Diagnosis Date  . Chronic pain   . Complication of anesthesia   . Headache   . Hypertension   . Joint disorder   . PONV (postoperative nausea and vomiting)   . Porphyria ([place])     Family History  Problem Relation Age of Onset  . Breast cancer Mother   . Rheum arthritis Mother   . Aneurysm Father   . Heart disease Father   . Throat cancer Father   . Melanoma Father   . Heart disease Brother   . Heart disease Brother   . Thyroid disease Brother   . Post-traumatic stress disorder Brother   . Rheum arthritis Brother   . Polymyositis Brother   . Obesity Brother     Past Surgical History:  Procedure Laterality Date  . ABDOMINAL HYSTERECTOMY     Age 47  .  ANKLE SURGERY     x3  . BIOPSY  10/19/2018   Procedure: BIOPSY;  Surgeon: Ronnette Juniper, MD;  [facility];  Service: Gastroenterology;;  . COLONOSCOPY WITH PROPOFOL N/A 10/19/2018   Procedure: COLONOSCOPY WITH PROPOFOL;  Surgeon: Ronnette Juniper, MD;  [facility];  Service: Gastroenterology;  Laterality: N/A;  . DILATION AND CURETTAGE OF UTERUS     x 4  . ESOPHAGOGASTRODUODENOSCOPY (EGD) WITH PROPOFOL N/A 10/19/2018   Procedure: ESOPHAGOGASTRODUODENOSCOPY (EGD) WITH PROPOFOL;  Surgeon: Ronnette Juniper, MD;  [facility];  Service: Gastroenterology;  Laterality: N/A;  . KNEE SURGERY     x 3  . MANDIBLE FRACTURE SURGERY     x 19  . POLYPECTOMY   10/19/2018   Procedure: POLYPECTOMY;  Surgeon: Ronnette Juniper, MD;  Location: [facility];  Service: Gastroenterology;;   Social History   Occupational History  . Occupation: Part-time Secondary school teacher  Tobacco Use  . Smoking status: Former Research scientist (life sciences)  . Smokeless tobacco: Never Used  . Tobacco comment: Quit January 1980  Vaping Use  . Vaping Use: Never used  Substance and Sexual Activity  . Alcohol use: Yes    Comment: occasional  . Drug use: No  . Sexual activity: Not on file

## 2019-07-27 ENCOUNTER — Other Ambulatory Visit: Payer: Self-pay | Admitting: Neurosurgery

## 2019-07-31 ENCOUNTER — Other Ambulatory Visit (HOSPITAL_COMMUNITY): Payer: Medicare Other

## 2019-07-31 ENCOUNTER — Other Ambulatory Visit: Payer: Self-pay | Admitting: Neurosurgery

## 2019-08-02 NOTE — Pre-Procedure Instructions (Signed)
Your procedure is scheduled on Tuesday, July 27 from 07:30 AM- 08:30 AM.  Report to [facility] Main Entrance "A" at 05:30 A.M., and check in at the Admitting office.  Call this number if you have problems the morning of surgery:  [phone]  Call [phone] if you have any questions prior to your surgery date Monday-Friday 8am-4pm.    Remember:  Do not eat after midnight the night before your surgery.  You may drink clear liquids until 04:30 AM the morning of your surgery.   Clear liquids allowed are: Water, Non-Citrus Juices (without pulp), Carbonated Beverages, Clear Tea, Black Coffee Only, and Gatorade.    Take these medicines the morning of surgery with A SIP OF WATER: thyroid (ARMOUR)  IF NEEDED: ondansetron (ZOFRAN) Carboxymethylcellul-Glycerin (LUBRICATING EYE DROPS OP) eye drops  *Stop buprenorphine (SUBUTEX) 3 days prior to surgery. Contact your Primary Care Provider to further discuss plan/ options if unable to Hold medication as suggested.  As of today, STOP taking any Aspirin (unless otherwise instructed by your surgeon) Aleve, Naproxen, Ibuprofen, Motrin, Advil, Goody's, BC's, all herbal medications, fish oil, and all vitamins.       The Morning of Surgery:               Do not wear jewelry, make up, or nail polish.            Do not wear lotions, powders, perfumes, or deodorant.            Do not shave 48 hours prior to surgery.              Do not bring valuables to the hospital.            [facility] is not responsible for any belongings or valuables.  Do NOT Smoke (Tobacco/Vaping) or drink Alcohol 24 hours prior to your procedure.  If you use a CPAP at night, you may bring all equipment for your overnight stay.   Contacts, glasses, dentures or bridgework may not be worn into surgery.      For patients admitted to the hospital, discharge time will be determined by your treatment team.   Patients discharged the day of surgery will not be allowed to drive  home, and someone needs to stay with them for 24 hours.    Special instructions:   [place]- Preparing For Surgery  Before surgery, you can play an important role. Because skin is not sterile, your skin needs to be as free of germs as possible. You can reduce the number of germs on your skin by washing with CHG (chlorahexidine gluconate) Soap before surgery.  CHG is an antiseptic cleaner which kills germs and bonds with the skin to continue killing germs even after washing.    Oral Hygiene is also important to reduce your risk of infection.  Remember - BRUSH YOUR TEETH THE MORNING OF SURGERY WITH YOUR REGULAR TOOTHPASTE  Please do not use if you have an allergy to CHG or antibacterial soaps. If your skin becomes reddened/irritated stop using the CHG.  Do not shave (including legs and underarms) for at least 48 hours prior to first CHG shower. It is OK to shave your face.  Please follow these instructions carefully.   1. Shower the NIGHT BEFORE SURGERY and the MORNING OF SURGERY with CHG Soap.   2. If you chose to wash your hair, wash your hair first as usual with your normal shampoo.  3. After you shampoo, rinse your hair  and body thoroughly to remove the shampoo.  4. Use CHG as you would any other liquid soap. You can apply CHG directly to the skin and wash gently with a scrungie or a clean washcloth.   5. Apply the CHG Soap to your body ONLY FROM THE NECK DOWN.  Do not use on open wounds or open sores. Avoid contact with your eyes, ears, mouth and genitals (private parts). Wash Face and genitals (private parts)  with your normal soap.   6. Wash thoroughly, paying special attention to the area where your surgery will be performed.  7. Thoroughly rinse your body with warm water from the neck down.  8. DO NOT shower/wash with your normal soap after using and rinsing off the CHG Soap.  9. Pat yourself dry with a CLEAN TOWEL.  10. Wear CLEAN PAJAMAS to bed the night before  surgery  11. Place CLEAN SHEETS on your bed the night of your first shower and DO NOT SLEEP WITH PETS.   Day of Surgery: Wear Clean/Comfortable clothing the morning of surgery Do not apply any deodorants/lotions.   Remember to brush your teeth WITH YOUR REGULAR TOOTHPASTE.   Please read over the following fact sheets that you were given.

## 2019-08-02 NOTE — Progress Notes (Signed)
Orders in 2nd sign. [employer] Dr. Marcello Moores.

## 2019-08-03 ENCOUNTER — Other Ambulatory Visit: Payer: Self-pay

## 2019-08-03 ENCOUNTER — Encounter (HOSPITAL_COMMUNITY): Payer: Self-pay

## 2019-08-03 ENCOUNTER — Other Ambulatory Visit (HOSPITAL_COMMUNITY)
Admission: RE | Admit: 2019-08-03 | Discharge: 2019-08-03 | Disposition: A | Discharge status: Home / Self Care | Payer: Medicare Other | Source: Ambulatory Visit | Attending: Neurosurgery | Admitting: Neurosurgery

## 2019-08-03 ENCOUNTER — Encounter (HOSPITAL_COMMUNITY)
Admission: RE | Admit: 2019-08-03 | Discharge: 2019-08-03 | Disposition: A | Discharge status: Home / Self Care | Payer: Medicare Other | Source: Ambulatory Visit | Attending: Neurosurgery | Admitting: Neurosurgery

## 2019-08-03 DIAGNOSIS — Z20822 Contact with and (suspected) exposure to covid-19: Secondary | ICD-10-CM | POA: Insufficient documentation

## 2019-08-03 DIAGNOSIS — Z01812 Encounter for preprocedural laboratory examination: Secondary | ICD-10-CM | POA: Diagnosis not present

## 2019-08-03 HISTORY — DX: Sleep apnea, unspecified: G47.30

## 2019-08-03 HISTORY — DX: Nonrheumatic mitral (valve) prolapse: I34.1

## 2019-08-03 HISTORY — DX: Failed or difficult intubation, initial encounter: T88.4XXA

## 2019-08-03 HISTORY — DX: Pneumonia, unspecified organism: J18.9

## 2019-08-03 HISTORY — DX: Hypothyroidism, unspecified: E03.9

## 2019-08-03 HISTORY — DX: Malignant (primary) neoplasm, unspecified: C80.1

## 2019-08-03 LAB — CBC
HCT: 39.4 % (ref 36.0–46.0)
Hemoglobin: 12.6 g/dL (ref 12.0–15.0)
MCH: 31.4 pg (ref 26.0–34.0)
MCHC: 32 g/dL (ref 30.0–36.0)
MCV: 98.3 fL (ref 80.0–100.0)
Platelets: 288 10*3/uL (ref 150–400)
RBC: 4.01 MIL/uL (ref 3.87–5.11)
RDW: 12.4 % (ref 11.5–15.5)
WBC: 4.4 10*3/uL (ref 4.0–10.5)
nRBC: 0 % (ref 0.0–0.2)

## 2019-08-03 LAB — COMPREHENSIVE METABOLIC PANEL
ALT: 15 U/L (ref 0–44)
AST: 21 U/L (ref 15–41)
Albumin: 4.2 g/dL (ref 3.5–5.0)
Alkaline Phosphatase: 57 U/L (ref 38–126)
Anion gap: 10 (ref 5–15)
BUN: 14 mg/dL (ref 8–23)
CO2: 30 mmol/L (ref 22–32)
Calcium: 9.7 mg/dL (ref 8.9–10.3)
Chloride: 97 mmol/L — ABNORMAL LOW (ref 98–111)
Creatinine, Ser: 0.8 mg/dL (ref 0.44–1.00)
GFR calc Af Amer: >60 mL/min (ref >60)
GFR calc non Af Amer: >60 mL/min (ref >60)
Glucose, Bld: 94 mg/dL (ref 70–99)
Potassium: 3.1 mmol/L — ABNORMAL LOW (ref 3.5–5.1)
Sodium: 137 mmol/L (ref 135–145)
Total Bilirubin: 1 mg/dL (ref 0.3–1.2)
Total Protein: 7.5 g/dL (ref 6.5–8.1)

## 2019-08-03 LAB — SURGICAL PCR SCREEN
MRSA, PCR: NEGATIVE
Staphylococcus aureus: NEGATIVE

## 2019-08-03 LAB — SARS CORONAVIRUS 2 (TAT 6-24 HRS): SARS Coronavirus 2: NEGATIVE

## 2019-08-03 NOTE — Progress Notes (Signed)
Anesthesia PAT Evaluation:  Case: [id number] Date/Time: 08/07/19 0715   Procedure: KYPHOPLASTY THORACIC 12 (N/A )   Anesthesia type: General   Pre-op diagnosis: THORACIC 12 BURST FRACTURE   Location: [facility] OR ROOM 19 / [facility]   Surgeons: Vallarie Mare, MD      DISCUSSION: Patient is a 74 year old female scheduled for the above procedure.  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 She denied CHF history.  She denied chest pain and SOB. She fell and injured her back in the [place]. Prior to that she and her daughter would hike often to places like [place] without CV symptoms.   Updated anesthesiologist Lillia Abed, MD regarding anticipated difficult airway due to limited mouth opening.  Anesthesiologist to evaluate on the day of surgery.  Anticipate use of glidescope or advanced airway equipment.  Patient is comfortable talking further with her anesthesiologist on the day of surgery.  08/03/2019 presurgical COVID-19 test is in process.  Anesthesia team to evaluate on the day of surgery.   VS: BP 117/75   Pulse 85   Temp 36.9 C (Oral)   Resp 18   Ht 5' 5.5" (1.664 m)   Wt 52.6 kg   SpO2 96%   BMI 19.02 kg/m Heart regular rate and rhythm.  No murmur noted.  Lungs clear.  No ankle edema.  She is limited mouth opening (< 2 fingerbreadths) with prominent teeth.   PROVIDERS: Janie Morning, DO his PCP - Adrian Prows, MD is cardiologist. Last visit 01/17/18.palpitations improved on verapamil.  Continue to observe MVP.  He noted a very soft murmur.  1 year follow-up recommended for  palpitations and MVP.  Rachael Fee, MD is Pain Specialist. Advised hold Subutex 3 days prior to surgery unless otherwise instructed.  Ronnette Juniper, MD is GI. She was also seen on 04/29/16 by Delight Stare, MD at [facility] ([place]). 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 Keys are avoidance of triggering factors and sunlight."  - She is not currently seeing a oral and maxillofacial surgeon, but reports last evaluation with Burke Keels, DDS, MD with [facility], but she did not want to pursue additional jaw surgery at that time.    LABS: Labs reviewed: Acceptable for surgery. (all labs ordered are listed, but only abnormal results are displayed)  Labs Reviewed  COMPREHENSIVE METABOLIC PANEL - Abnormal; Notable for the following components:      Result Value   Potassium 3.1 (*)    Chloride 97 (*)    All other components within normal limits  SURGICAL PCR SCREEN  CBC     IMAGES: CT Maxillofacial 11/07/13 [facility] CE): IMPRESSION:  Postoperative appearance of the bilateral mandibular condyle with bony overgrowth involving the TMJ joints.   EKG: 05/06/19: Normal sinus rhythm Biatrial enlargement Incomplete right bundle branch block Abnormal ECG Confirmed by Elnora Morrison [id number]) on 05/06/2019 8:32:23 PM   CV: Nuclear stress test 01/06/2018 [facility] CV): 1.  The patient performed treadmill exercise using Bruce protocol, completing 6: 44 minutes.  The patient completed an estimated workload of 8.2 METS, reaching 95% of  the maximum predicted heart rate.  Resting hypertension with normal exercise response was seen.  Exercise capacity low.  Stress symptoms included back pain.  No ischemic changes seen on stress electrocardiogram. 2.  The overall quality of the study is good.  There is no evidence of abnormal lung activity.  Stress and rest SPECT images demonstrate homogeneous tracer distribution throughout the  myocardium.  Gated SPECT images reveal normal myocardial thickening wall motion.  The left ventricular ejection fraction was normal at 79%. 3.  Low risk study.   Echocardiogram 12/26/2017 [facility] CV): Left ventricle cavity is normal in size.  Normal global wall motion.  Normal diastolic filling pattern, normal LAP.  Calculated EF 55%. Mild prolapse of the anterior mitral valve leaflet with posteriorly directed mild to at most moderate MR.  No significant myxomatous degeneration. Mild tricuspid regurgitation.  No evidence of pulmonary hypertension.   Event monitor Zio patch 11/23/2017 - 12/02/2017 [facility] CV):  Minimum heart rate 55, maximum heart rate 114 bpm, predominant rhythm sinus rhythm.  29 supraventricular tachycardia (atrial tachycardia) runs occurred, fastest 214 bpm, longest 14.8 seconds.  These were symptomatic.  Occasional PVCs and PACs were noted.   Past Medical History:  Diagnosis Date  . Cancer ([place])    skin  . CHF (congestive heart failure) ([place])   . Chronic pain   . Complication of anesthesia   . Difficult intubation   . Headache   . Hypertension   . Hypothyroidism   . Joint disorder   . Mitral valve prolapse   . Pneumonia   . PONV (postoperative nausea and vomiting)   . Porphyria ([place])   . Sleep apnea     Past Surgical History:  Procedure Laterality Date  . ABDOMINAL HYSTERECTOMY     Age 52  . ANKLE SURGERY     x3  . APPENDECTOMY    . BIOPSY  10/19/2018   Procedure: BIOPSY;  Surgeon: Ronnette Juniper, MD;  [facility];  Service: Gastroenterology;;  . COLONOSCOPY WITH PROPOFOL N/A 10/19/2018   Procedure: COLONOSCOPY WITH PROPOFOL;  Surgeon: Ronnette Juniper, MD;  [facility];  Service: Gastroenterology;  Laterality: N/A;  . DILATION AND CURETTAGE OF UTERUS     x 4  . ESOPHAGOGASTRODUODENOSCOPY (EGD) WITH PROPOFOL N/A 10/19/2018   Procedure: ESOPHAGOGASTRODUODENOSCOPY (EGD) WITH PROPOFOL;  Surgeon: Ronnette Juniper, MD;  [facility];   Service: Gastroenterology;  Laterality: N/A;  . FRACTURE SURGERY     jaw  . KNEE SURGERY     x 3  . MANDIBLE FRACTURE SURGERY     x 19  . POLYPECTOMY  10/19/2018   Procedure: POLYPECTOMY;  Surgeon: Ronnette Juniper, MD;  [facility];  Service: Gastroenterology;;    MEDICATIONS: . amphetamine-dextroamphetamine (ADDERALL) 10 MG tablet  . bisacodyl (DULCOLAX) 5 MG EC tablet  . buprenorphine (SUBUTEX) 8 MG SUBL  . Carboxymethylcellul-Glycerin (LUBRICATING EYE DROPS OP)  . Estradiol 10 MCG TABS vaginal tablet  . furosemide (LASIX) 20 MG tablet  . Glucosamine HCl 1000 MG TABS  . lisinopril-hydrochlorothiazide (PRINZIDE,ZESTORETIC) 10-12.5 MG tablet  . ondansetron (ZOFRAN) 8 MG tablet  . potassium chloride (MICRO-K) 10 MEQ CR capsule  . thyroid (ARMOUR) 60 MG tablet  . Turmeric 500 MG CAPS   No current facility-administered medications for this encounter.    Myra Gianotti, PA-C Surgical Short Stay/Anesthesiology [facility] Phone [phone] [facility] Phone [phone] 08/03/2019 6:39 PM

## 2019-08-03 NOTE — Progress Notes (Signed)
PCP - Janie Morning, DO Cardiologist - Denies  PPM/ICD - Denies  Chest x-ray - N/A EKG - 05/14/19 Stress Test - 03/03/09 ECHO - 03/03/09 Cardiac Cath - Denies  Sleep Study - Yes, positive for OSA CPAP - Noncompliant  Patient denies being diabetic.  Blood Thinner Instructions: N/A Aspirin Instructions: N/A  ERAS Protcol - Yes PRE-SURGERY Ensure or G2- Not ordered  COVID TEST- 08/03/19   Anesthesia review: Yes, hx. Difficulty intubation (anesthesia records requested from [facility]); review ECHO and Stress Test.  Patient denies shortness of breath, fever, cough and chest pain at PAT appointment   All instructions explained to the patient, with a verbal understanding of the material. Patient agrees to go over the instructions while at home for a better understanding. Patient also instructed to self quarantine after being tested for COVID-19. The opportunity to ask questions was provided.

## 2019-08-03 NOTE — Progress Notes (Signed)
Called and left VM for Nicky, Dr. Marcello Moores' scheduler to notify of Potassium of 3.1.

## 2019-08-03 NOTE — Anesthesia Preprocedure Evaluation (Addendum)
Anesthesia Evaluation  Patient identified by MRN, date of birth, ID band Patient awake    Reviewed: Allergy & Precautions, NPO status , Patient's Chart, lab work & pertinent test results  History of Anesthesia Complications (+) PONV, DIFFICULT AIRWAY and history of anesthetic complications  Airway Mallampati: III  TM Distance: >3 FB Neck ROM: Limited    Dental  (+) Teeth Intact, Dental Advisory Given   Pulmonary sleep apnea , former smoker,    Pulmonary exam normal breath sounds clear to auscultation       Cardiovascular hypertension, Pt. 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)   Abdominal   Peds  (+) ADHD Hematology negative hematology ROS (+)   Anesthesia Other Findings   Reproductive/Obstetrics                          Anesthesia Physical Anesthesia Plan  ASA: II  Anesthesia Plan: General   Post-op Pain Management:    Induction: Intravenous  PONV Risk Score and Plan: 4 or greater and Dexamethasone, Ondansetron and Treatment may vary due to age or medical condition  Airway Management Planned: Oral ETT, Video Laryngoscope Planned and Fiberoptic Intubation Planned  Additional Equipment:   Intra-op Plan:   Post-operative Plan: Extubation in OR  Informed Consent: I have reviewed the patients History and Physical, chart, labs and discussed the procedure including the risks, benefits and alternatives for the proposed anesthesia with the patient or authorized representative who has indicated his/her understanding and acceptance.     Dental  advisory given  Plan Discussed with: CRNA  Anesthesia Plan Comments: (See PAT note written 08/03/2019 by Myra Gianotti, PA-C.  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 Potential difficult intubation.  )      Anesthesia Quick Evaluation

## 2019-08-07 ENCOUNTER — Ambulatory Visit (HOSPITAL_COMMUNITY): Payer: Medicare Other

## 2019-08-07 ENCOUNTER — Ambulatory Visit (HOSPITAL_COMMUNITY): Payer: Medicare Other | Admitting: Anesthesiology

## 2019-08-07 ENCOUNTER — Ambulatory Visit (HOSPITAL_COMMUNITY): Payer: Medicare Other | Admitting: Vascular Surgery

## 2019-08-07 ENCOUNTER — Encounter (HOSPITAL_COMMUNITY)
Admission: RE | Disposition: A | Discharge status: Home / Self Care | Payer: Self-pay | Source: Home / Self Care | Attending: Neurosurgery

## 2019-08-07 ENCOUNTER — Ambulatory Visit (HOSPITAL_COMMUNITY)
Admission: RE | Admit: 2019-08-07 | Discharge: 2019-08-07 | Disposition: A | Discharge status: Home / Self Care | Payer: Medicare Other | Attending: Neurosurgery | Admitting: Neurosurgery

## 2019-08-07 ENCOUNTER — Encounter (HOSPITAL_COMMUNITY): Payer: Self-pay

## 2019-08-07 ENCOUNTER — Other Ambulatory Visit: Payer: Self-pay

## 2019-08-07 DIAGNOSIS — E221 Hyperprolactinemia: Secondary | ICD-10-CM | POA: Diagnosis not present

## 2019-08-07 DIAGNOSIS — G4730 Sleep apnea, unspecified: Secondary | ICD-10-CM | POA: Diagnosis not present

## 2019-08-07 DIAGNOSIS — Z87891 Personal history of nicotine dependence: Secondary | ICD-10-CM | POA: Diagnosis not present

## 2019-08-07 DIAGNOSIS — Z79890 Hormone replacement therapy (postmenopausal): Secondary | ICD-10-CM | POA: Diagnosis not present

## 2019-08-07 DIAGNOSIS — I10 Essential (primary) hypertension: Secondary | ICD-10-CM | POA: Diagnosis not present

## 2019-08-07 DIAGNOSIS — M8008XA Age-related osteoporosis with current pathological fracture, vertebra(e), initial encounter for fracture: Secondary | ICD-10-CM | POA: Diagnosis not present

## 2019-08-07 DIAGNOSIS — Z79899 Other long term (current) drug therapy: Secondary | ICD-10-CM | POA: Diagnosis not present

## 2019-08-07 DIAGNOSIS — Z981 Arthrodesis status: Secondary | ICD-10-CM | POA: Diagnosis not present

## 2019-08-07 DIAGNOSIS — Z85828 Personal history of other malignant neoplasm of skin: Secondary | ICD-10-CM | POA: Diagnosis not present

## 2019-08-07 DIAGNOSIS — E039 Hypothyroidism, unspecified: Secondary | ICD-10-CM | POA: Insufficient documentation

## 2019-08-07 DIAGNOSIS — F909 Attention-deficit hyperactivity disorder, unspecified type: Secondary | ICD-10-CM | POA: Diagnosis not present

## 2019-08-07 DIAGNOSIS — Z419 Encounter for procedure for purposes other than remedying health state, unspecified: Secondary | ICD-10-CM

## 2019-08-07 DIAGNOSIS — S22080A Wedge compression fracture of T11-T12 vertebra, initial encounter for closed fracture: Secondary | ICD-10-CM | POA: Diagnosis not present

## 2019-08-07 HISTORY — PX: KYPHOPLASTY: SHX5884

## 2019-08-07 SURGERY — KYPHOPLASTY
Anesthesia: General | Site: Back

## 2019-08-07 MED ORDER — ONDANSETRON HCL 4 MG/2ML IJ SOLN
INTRAMUSCULAR | Status: DC | PRN
  Administered 2019-08-07: 4 mg via INTRAVENOUS

## 2019-08-07 MED ORDER — ROCURONIUM BROMIDE 10 MG/ML (PF) SYRINGE
PREFILLED_SYRINGE | INTRAVENOUS | Status: AC
  Filled 2019-08-07: qty 10

## 2019-08-07 MED ORDER — HYDROMORPHONE HCL 1 MG/ML IJ SOLN
INTRAMUSCULAR | Status: AC
  Filled 2019-08-07: qty 0.5

## 2019-08-07 MED ORDER — PROPOFOL 10 MG/ML IV BOLUS
INTRAVENOUS | Status: AC
  Filled 2019-08-07: qty 40

## 2019-08-07 MED ORDER — CHLORHEXIDINE GLUCONATE 0.12 % MT SOLN
15.0000 mL | Freq: Once | OROMUCOSAL | Status: AC
  Administered 2019-08-07: 15 mL via OROMUCOSAL

## 2019-08-07 MED ORDER — CEFAZOLIN SODIUM-DEXTROSE 2-3 GM-%(50ML) IV SOLR
INTRAVENOUS | Status: DC | PRN
  Administered 2019-08-07: 2 g via INTRAVENOUS

## 2019-08-07 MED ORDER — ROCURONIUM BROMIDE 10 MG/ML (PF) SYRINGE
PREFILLED_SYRINGE | INTRAVENOUS | Status: DC | PRN
  Administered 2019-08-07: 50 mg via INTRAVENOUS

## 2019-08-07 MED ORDER — LIDOCAINE-EPINEPHRINE 1 %-1:100000 IJ SOLN
INTRAMUSCULAR | Status: AC
  Filled 2019-08-07: qty 1

## 2019-08-07 MED ORDER — ORAL CARE MOUTH RINSE: 15.0000 mL | Freq: Once | OROMUCOSAL | Status: AC

## 2019-08-07 MED ORDER — ONDANSETRON HCL 4 MG/2ML IJ SOLN: 4.0000 mg | Freq: Once | INTRAMUSCULAR | Status: DC | PRN

## 2019-08-07 MED ORDER — ONDANSETRON HCL 4 MG/2ML IJ SOLN
INTRAMUSCULAR | Status: AC
  Filled 2019-08-07: qty 2

## 2019-08-07 MED ORDER — 0.9 % SODIUM CHLORIDE (POUR BTL) OPTIME
TOPICAL | Status: DC | PRN
  Administered 2019-08-07: 1000 mL

## 2019-08-07 MED ORDER — FENTANYL CITRATE (PF) 250 MCG/5ML IJ SOLN
INTRAMUSCULAR | Status: DC | PRN
  Administered 2019-08-07 (×2): 50 ug via INTRAVENOUS

## 2019-08-07 MED ORDER — LIDOCAINE 2% (20 MG/ML) 5 ML SYRINGE
INTRAMUSCULAR | Status: AC
  Filled 2019-08-07: qty 10

## 2019-08-07 MED ORDER — PHENYLEPHRINE 40 MCG/ML (10ML) SYRINGE FOR IV PUSH (FOR BLOOD PRESSURE SUPPORT)
PREFILLED_SYRINGE | INTRAVENOUS | Status: AC
  Filled 2019-08-07: qty 20

## 2019-08-07 MED ORDER — ROCURONIUM BROMIDE 10 MG/ML (PF) SYRINGE
PREFILLED_SYRINGE | INTRAVENOUS | Status: AC
  Filled 2019-08-07: qty 20

## 2019-08-07 MED ORDER — CEFAZOLIN SODIUM-DEXTROSE 2-4 GM/100ML-% IV SOLN
INTRAVENOUS | Status: AC
  Filled 2019-08-07: qty 100

## 2019-08-07 MED ORDER — HYDROCODONE-ACETAMINOPHEN 5-325 MG PO TABS: 1.0000 | ORAL_TABLET | Freq: Four times a day (QID) | ORAL | 0 refills | Status: DC | PRN

## 2019-08-07 MED ORDER — EPHEDRINE SULFATE 50 MG/ML IJ SOLN
INTRAMUSCULAR | Status: DC | PRN
  Administered 2019-08-07: 5 mg via INTRAVENOUS
  Administered 2019-08-07: 10 mg via INTRAVENOUS

## 2019-08-07 MED ORDER — FENTANYL CITRATE (PF) 100 MCG/2ML IJ SOLN
25.0000 ug | INTRAMUSCULAR | Status: DC | PRN
  Administered 2019-08-07 (×2): 25 ug via INTRAVENOUS

## 2019-08-07 MED ORDER — PHENYLEPHRINE HCL (PRESSORS) 10 MG/ML IV SOLN
INTRAVENOUS | Status: DC | PRN
Start: 2019-08-07 — End: 2019-08-07
  Administered 2019-08-07: 120 ug via INTRAVENOUS
  Administered 2019-08-07: 80 ug via INTRAVENOUS
  Administered 2019-08-07: 120 ug via INTRAVENOUS
  Administered 2019-08-07: 80 ug via INTRAVENOUS

## 2019-08-07 MED ORDER — FENTANYL CITRATE (PF) 100 MCG/2ML IJ SOLN
INTRAMUSCULAR | Status: AC
  Filled 2019-08-07: qty 2

## 2019-08-07 MED ORDER — BUPIVACAINE HCL (PF) 0.5 % IJ SOLN
INTRAMUSCULAR | Status: AC
  Filled 2019-08-07: qty 30

## 2019-08-07 MED ORDER — LACTATED RINGERS IV SOLN: INTRAVENOUS | Status: DC

## 2019-08-07 MED ORDER — FENTANYL CITRATE (PF) 250 MCG/5ML IJ SOLN
INTRAMUSCULAR | Status: AC
  Filled 2019-08-07: qty 5

## 2019-08-07 MED ORDER — EPHEDRINE 5 MG/ML INJ
INTRAVENOUS | Status: AC
  Filled 2019-08-07: qty 10

## 2019-08-07 MED ORDER — PROPOFOL 10 MG/ML IV BOLUS
INTRAVENOUS | Status: DC | PRN
  Administered 2019-08-07: 130 mg via INTRAVENOUS

## 2019-08-07 MED ORDER — BUPIVACAINE HCL (PF) 0.5 % IJ SOLN
INTRAMUSCULAR | Status: DC | PRN
  Administered 2019-08-07: 10 mL

## 2019-08-07 MED ORDER — LACTATED RINGERS IV SOLN: INTRAVENOUS | Status: DC | PRN

## 2019-08-07 MED ORDER — LIDOCAINE 2% (20 MG/ML) 5 ML SYRINGE
INTRAMUSCULAR | Status: DC | PRN
  Administered 2019-08-07: 60 mg via INTRAVENOUS

## 2019-08-07 MED ORDER — LIDOCAINE-EPINEPHRINE 1 %-1:100000 IJ SOLN
INTRAMUSCULAR | Status: DC | PRN
  Administered 2019-08-07: 6 mL

## 2019-08-07 MED ORDER — DEXAMETHASONE SODIUM PHOSPHATE 10 MG/ML IJ SOLN
INTRAMUSCULAR | Status: DC | PRN
  Administered 2019-08-07: 5 mg via INTRAVENOUS

## 2019-08-07 MED ORDER — DEXAMETHASONE SODIUM PHOSPHATE 10 MG/ML IJ SOLN
INTRAMUSCULAR | Status: AC
  Filled 2019-08-07: qty 2

## 2019-08-07 SURGICAL SUPPLY — 44 items
BENZOIN TINCTURE PRP APPL 2/3 (GAUZE/BANDAGES/DRESSINGS) ×3 IMPLANT
BLADE CLIPPER SURG (BLADE) IMPLANT
CANISTER SUCT 3000ML PPV (MISCELLANEOUS) IMPLANT
CANNULA IVAS ACCESS 11G (CANNULA) ×6 IMPLANT
CEMENT HV W/AUTOPLEX MIXER (KITS) ×3 IMPLANT
CLOSURE WOUND 1/2 X4 (GAUZE/BANDAGES/DRESSINGS)
CLOSURE WOUND 1/4X4 (GAUZE/BANDAGES/DRESSINGS) ×1
COVER WAND RF STERILE (DRAPES) IMPLANT
DECANTER SPIKE VIAL GLASS SM (MISCELLANEOUS) ×3 IMPLANT
DERMABOND ADVANCED (GAUZE/BANDAGES/DRESSINGS)
DERMABOND ADVANCED .7 DNX12 (GAUZE/BANDAGES/DRESSINGS) IMPLANT
DRAPE C-ARM 42X72 X-RAY (DRAPES) ×3 IMPLANT
DRAPE LAPAROTOMY 100X72X124 (DRAPES) ×3 IMPLANT
DRAPE SURG 17X23 STRL (DRAPES) ×3 IMPLANT
DRAPE WARM FLUID 44X44 (DRAPES) IMPLANT
DRSG OPSITE POSTOP 3X4 (GAUZE/BANDAGES/DRESSINGS) ×6 IMPLANT
DURAPREP 26ML APPLICATOR (WOUND CARE) ×3 IMPLANT
GAUZE 4X4 16PLY RFD (DISPOSABLE) IMPLANT
GLOVE BIOGEL PI IND STRL 7.5 (GLOVE) ×1 IMPLANT
GLOVE BIOGEL PI IND STRL 8 (GLOVE) ×4 IMPLANT
GLOVE BIOGEL PI INDICATOR 7.5 (GLOVE) ×2
GLOVE BIOGEL PI INDICATOR 8 (GLOVE) ×8
GLOVE ECLIPSE 7.5 STRL STRAW (GLOVE) ×6 IMPLANT
GOWN STRL REUS W/ TWL LRG LVL3 (GOWN DISPOSABLE) IMPLANT
GOWN STRL REUS W/ TWL XL LVL3 (GOWN DISPOSABLE) ×1 IMPLANT
GOWN STRL REUS W/TWL 2XL LVL3 (GOWN DISPOSABLE) ×6 IMPLANT
GOWN STRL REUS W/TWL LRG LVL3 (GOWN DISPOSABLE)
GOWN STRL REUS W/TWL XL LVL3 (GOWN DISPOSABLE) ×3
KIT BASIN OR (CUSTOM PROCEDURE TRAY) ×3 IMPLANT
KIT SPINEJACK CASE 5.0 (KITS) ×3 IMPLANT
KIT TURNOVER KIT B (KITS) ×3 IMPLANT
NEEDLE HYPO 22GX1.5 SAFETY (NEEDLE) ×3 IMPLANT
NS IRRIG 1000ML POUR BTL (IV SOLUTION) ×3 IMPLANT
PACK LAMINECTOMY NEURO (CUSTOM PROCEDURE TRAY) ×3 IMPLANT
PAD ARMBOARD 7.5X6 YLW CONV (MISCELLANEOUS) ×9 IMPLANT
PUSHER CEMENT 5.0-5.8 (MISCELLANEOUS) ×6 IMPLANT
STRIP CLOSURE SKIN 1/2X4 (GAUZE/BANDAGES/DRESSINGS) IMPLANT
STRIP CLOSURE SKIN 1/4X4 (GAUZE/BANDAGES/DRESSINGS) ×2 IMPLANT
SUT MNCRL AB 4-0 PS2 18 (SUTURE) IMPLANT
SUT VICRYL 3-0 RB1 18 ABS (SUTURE) IMPLANT
SUT VICRYL RAPIDE 4/0 PS 2 (SUTURE) ×9 IMPLANT
TOWEL GREEN STERILE (TOWEL DISPOSABLE) ×3 IMPLANT
TOWEL GREEN STERILE FF (TOWEL DISPOSABLE) ×3 IMPLANT
WATER STERILE IRR 1000ML POUR (IV SOLUTION) ×3 IMPLANT

## 2019-08-07 NOTE — Anesthesia Postprocedure Evaluation (Addendum)
Anesthesia Post Note  Patient: Angela [name]  Procedure(s) Performed: Thoracic twelve Vertebral Augmentation with Stenting/SpineJack (N/A Back)     Patient location during evaluation: PACU Anesthesia Type: General Level of consciousness: awake and alert Vital Signs Assessment: post-procedure vital signs reviewed and stable Respiratory status: spontaneous breathing, nonlabored ventilation and respiratory function stable Cardiovascular status: blood pressure returned to baseline and stable Postop Assessment: no apparent nausea or vomiting Anesthetic complications: no   No complications documented.  Last Vitals:  Vitals:   08/07/19 1030 08/07/19 1035  BP: 113/69 110/67  Pulse: 71 71  Resp: 13 13  Temp:  36.7 C  SpO2: 99% 100%    Last Pain:  Vitals:   08/07/19 1015  TempSrc:   PainSc: 10-Worst pain ever                 Catalina Gravel

## 2019-08-07 NOTE — Anesthesia Procedure Notes (Signed)
Procedure Name: Intubation Date/Time: 08/07/2019 7:46 AM Performed by: Glynda Jaeger, CRNA Pre-anesthesia Checklist: Patient identified, Patient being monitored, Timeout performed, Emergency Drugs available and Suction available Patient Re-evaluated:Patient Re-evaluated prior to induction Oxygen Delivery Method: Circle System Utilized Preoxygenation: Pre-oxygenation with 100% oxygen Induction Type: IV induction Ventilation: Mask ventilation without difficulty Laryngoscope Size: 3 and Glidescope Grade View: Grade I Tube type: Oral Tube size: 7.5 mm Number of attempts: 1 Airway Equipment and Method: Stylet Placement Confirmation: ETT inserted through vocal cords under direct vision,  positive ETCO2 and breath sounds checked- equal and bilateral Secured at: 22 cm Tube secured with: Tape Dental Injury: Teeth and Oropharynx as per pre-operative assessment

## 2019-08-07 NOTE — Transfer of Care (Signed)
Immediate Anesthesia Transfer of Care Note  Patient: Angela [name]  Procedure(s) Performed: Thoracic twelve Vertebral Augmentation with Stenting/SpineJack (N/A Back)  Patient Location: PACU  Anesthesia Type:General  Level of Consciousness: awake, alert , oriented, patient cooperative and responds to stimulation  Airway & Oxygen Therapy: Patient Spontanous Breathing and Patient connected to face mask oxygen  Post-op Assessment: Report given to RN, Post -op Vital signs reviewed and stable and Patient moving all extremities X 4  Post vital signs: Reviewed and stable  Last Vitals:  Vitals Value Taken Time  BP    Temp    Pulse    Resp    SpO2      Last Pain:  Vitals:   08/07/19 0614  TempSrc:   PainSc: 4       Patients Stated Pain Goal: 2 (52/48/18 5909)  Complications: No complications documented.

## 2019-08-07 NOTE — H&P (Signed)
Subjective:   Patient is a 74 y.o. female presents with a acute T12 compression fracture, with mechanical pain that has increased over the past 2 months despite bracing therapy, activity restriction, and pain medication.    Patient Active Problem List   Diagnosis Date Noted  . Attention deficit hyperactivity disorder, predominantly inattentive type 07/25/2019  . Chronic pain syndrome 07/25/2019  . Irritable bowel syndrome 07/25/2019  . Urethral caruncle 07/25/2019  . Thyroid dysfunction 07/25/2019  . Osteoporosis 07/09/2019  . Hyperprolactinemia ([place]) 07/09/2019  . Trochanteric bursitis, left hip 06/28/2017  . Chronic, continuous use of opioids 10/12/2016  . 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 Other retention of urine 10/12/2016  . Vaginal atrophy 10/12/2016  . 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 New onset headache 01/19/2016  . Excessive sleepiness 01/19/2016  . HYPERTENSION, BENIGN 03/10/2009  . HYPERTENSION, UNCONTROLLED 01/29/2009  . ARM PAIN, LEFT 01/29/2009   Past Medical History:  Diagnosis Date  . Cancer ([place])    skin  . Chronic pain   . Complication of anesthesia   . 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 Headache   . Hypertension   . Hypothyroidism   . Joint disorder   . Mitral valve prolapse   . Pneumonia   . PONV (postoperative nausea and vomiting)   . 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 Sleep apnea     Past Surgical History:  Procedure Laterality Date  . ABDOMINAL HYSTERECTOMY     Age 76  . ANKLE SURGERY     x3  . APPENDECTOMY    . BIOPSY  10/19/2018   Procedure: BIOPSY;  Surgeon: Ronnette Juniper, MD;  Location: [facility];  Service: Gastroenterology;;  . COLONOSCOPY WITH PROPOFOL N/A 10/19/2018   Procedure: COLONOSCOPY WITH PROPOFOL;  Surgeon: Ronnette Juniper, MD;  Location: [facility];  Service: Gastroenterology;   Laterality: N/A;  . DILATION AND CURETTAGE OF UTERUS     x 4  . ESOPHAGOGASTRODUODENOSCOPY (EGD) WITH PROPOFOL N/A 10/19/2018   Procedure: ESOPHAGOGASTRODUODENOSCOPY (EGD) WITH PROPOFOL;  Surgeon: Ronnette Juniper, MD;  Location: [facility];  Service: Gastroenterology;  Laterality: N/A;  . FRACTURE SURGERY     jaw  . KNEE SURGERY     x 3  . MANDIBLE FRACTURE SURGERY     x 19  . POLYPECTOMY  10/19/2018   Procedure: POLYPECTOMY;  Surgeon: Ronnette Juniper, MD;  Location: [facility];  Service: Gastroenterology;;    Medications Prior to Admission  Medication Sig Dispense Refill Last Dose  . amphetamine-dextroamphetamine (ADDERALL) 10 MG tablet Take 1-2 tablets (10-20 mg total) by mouth daily with breakfast. (Patient taking differently: Take 10 mg by mouth 2 (two) times daily. ) 60 tablet 0 08/06/2019 at Unknown time  . bisacodyl (DULCOLAX) 5 MG EC tablet Take 5 mg by mouth daily as needed for moderate constipation.   Past Week at Unknown time  . buprenorphine (SUBUTEX) 8 MG SUBL Place 4 mg under the tongue 6 (six) times daily.    08/06/2019 at Unknown time  . Carboxymethylcellul-Glycerin (LUBRICATING EYE DROPS OP) Place 1 drop into both eyes 5 (five) times daily as needed (dry eyes).   08/07/2019 at 0430  . Estradiol 10 MCG TABS vaginal tablet Place 1 tablet vaginally every Monday, Wednesday, and Friday.    Past Week at Unknown time  . furosemide (LASIX) 20 MG tablet Take 40-60 mg by mouth daily as needed (  fluid retention (feet/legs)).   0 Past Week at Unknown time  . Glucosamine HCl 1000 MG TABS Take 1,000 mg by mouth daily.   Past Month at Unknown time  . lisinopril-hydrochlorothiazide (PRINZIDE,ZESTORETIC) 10-12.5 MG tablet Take 1 tablet by mouth every evening.    08/06/2019 at Unknown time  . ondansetron (ZOFRAN) 8 MG tablet Take 8 mg by mouth 2 (two) times daily as needed.   08/07/2019 at 0430  . potassium chloride (MICRO-K) 10 MEQ CR capsule Take 10 mEq by mouth 3 (three) times a week.    Past Month at  Unknown time  . thyroid (ARMOUR) 60 MG tablet Take 60 mg by mouth daily before breakfast.   08/07/2019 at 0430  . Turmeric 500 MG CAPS Take 500 mg by mouth daily.   Past Month at Unknown time   Allergies  Allergen Reactions  . 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 Doxycycline Nausea And Vomiting and Other (See Comments)    "blisters on feet"    Social History   Tobacco Use  . Smoking status: Former Research scientist (life sciences)  . Smokeless tobacco: Never Used  . Tobacco comment: Quit January 1980  Substance Use Topics  . Alcohol use: Yes    Comment: wine 4-5 days per week    Family History  Problem Relation Age of Onset  . Breast cancer Mother   . Rheum arthritis Mother   . Aneurysm Father   . Heart disease Father   . Throat cancer Father   . Melanoma Father   . Heart disease Brother   . Heart disease Brother   . Thyroid disease Brother   . Post-traumatic stress disorder Brother   . Rheum arthritis Brother   . Polymyositis Brother   . Obesity Brother     Review of Systems Pertinent items are noted in HPI.  Objective:   Patient Vitals for the past 8 hrs:  BP Temp Temp src Pulse Resp SpO2 Height Weight  08/07/19 0554 (!) 95/59 98.1 F (36.7 C) Oral 72 17 98 % 5' 5.5" (1.664 m) 51.7 kg   No intake/output data recorded. No intake/output data recorded.  NAD A+Ox3 Breathing comfortably Ext wwp 5/5 strength in LEs TTP in midline back  Data Review CBC:  Lab Results  Component Value Date   WBC 4.4 08/03/2019   RBC 4.01 08/03/2019   BMP:  Lab Results  Component Value Date   GLUCOSE 94 08/03/2019   CO2 30 08/03/2019   BUN 14 08/03/2019   CREATININE 0.80 08/03/2019   CALCIUM 9.7 08/03/2019   Coagulation:  Lab Results  Component Value Date   INR 1.43 07/21/2010   APTT 34 07/17/2010    Assessment:   T12 compression fracture  Plan:   - given severe loss of  height and persistent/worsening pain, patient would benefit from vertebral augmentation, with SpineJack allowing for greater height restoration and potentially better biomechanical stability longterm compared with balloon kyphoplasty

## 2019-08-07 NOTE — Op Note (Signed)
Procedure(s): Thoracic twelve Vertebral Augmentation with Stenting/SpineJack Procedure Note  Brittanya Winburn female 74 y.o. 08/07/2019  Procedure(s) and Anesthesia Type:    * Thoracic twelve Vertebral Augmentation with Stenting/SpineJack - General  Surgeon(s) and Role:    Marcello Moores, Dorcas Carrow, MD - Primary   Indications: This is a 74 year old woman who suffered a severe osteopenic T12 compression fracture after a fall with greater than 75% loss of height.  She was initially treated with medical therapy and brace therapy with a TLSO.  However, her mechanical back pain persisted despite maximal nonsurgical measures.  Therefore, I discussed with her the option of vertebral augmentation to help with pain palliation.  In her case, given the severe loss of height as well as kyphosis, kyphoplasty with vertebral body stenting would provide more optimal restoration of height and long-term biomechanical stability of her spine.  Risks, benefits, alternatives, expected convalescence were discussed with her.  Risks discussed included, but were not limited to, bleeding, pain, infection, fracture at other vertebra, progression of fracture, neurologic deficit, and death.  She wished to proceed.  Informed consent was obtained.     Surgeon: Vallarie Mare    Procedure Detail The patient was brought to the operating room.  After appropriate lines and monitors were placed, general anesthesia was induced and patient was intubated by the anesthesia service.  Her back was preprepped with alcohol and prepped and draped in sterile fashion.. A timeout was performed.  AP and lateral C arm x-rays were brought into the field and localize the fracture over the skin.  A stab incision was made with a 15 blade on both sides and Jamshidi needles was placed into the fractured vertebral body via bilateral transpedicular approach under C arm guidance.  The trocar was removed and a guidewire was placed. The small cannula was  removed and the reamer and the larger cannula were passed over the guidewire up to the anterior third of the vertebra bilaterally.  The reamer was withdrawn and the tamp was used to help prepare  the cavity for implant placement  Placed. mm  Millimeter implants were then implants were then used and then expanded placed and expanded local direction under x-ray guidance.  There was good restoration of endplate and body height.  Bone cement was then mixed and passed into the cavities and the fractured body under C-arm guidance.  There was good flow of cement across the midline with good containment within the fractured vertebra.  The cannulas were then withdrawn and a final AP and lateral C-arm x-rays confirmed good position of the implants and cement and good restoration of height.  The stab wounds were irrigated thoroughly with irrigation.  Marcaine was injected in to the paraspinous musculature bilaterally.  Wounds were closed with 4-0 Vicryl in interrupted fashion followed by Steri-Strips and sterile dressings.  Patient was then flipped supine and extubated by the anesthesia service.  She was following commands in all 4 extremities with full strength.  All counts were correct at the end of surgery.  No complications were noted.   Thoracic twelve Vertebral Augmentation with Stenting/SpineJack  Findings: Successful vertebral augmentation with some restoration of height  Estimated Blood Loss: Less than 5 mL         Specimens: None         Implants: [employer] Spine Jack 5.0 mm x 2 and cement        Complications:  * No complications entered in OR log *  Disposition: PACU - hemodynamically stable.         Condition: stable

## 2019-08-08 ENCOUNTER — Other Ambulatory Visit: Payer: Self-pay | Admitting: Adult Health

## 2019-08-08 NOTE — Telephone Encounter (Signed)
Pt is requesting a refill for amphetamine-dextroamphetamine (ADDERALL) 10 MG tablet .  Pharmacy: [id number]  

## 2019-08-09 ENCOUNTER — Encounter (HOSPITAL_COMMUNITY): Payer: Self-pay | Admitting: Neurosurgery

## 2019-08-09 NOTE — Addendum Note (Signed)
Addended by: Oliver Hum S on: 08/09/2019 11:04 AM   Modules accepted: Orders

## 2019-08-10 ENCOUNTER — Telehealth: Payer: Self-pay | Admitting: Adult Health

## 2019-08-10 MED ORDER — AMPHETAMINE-DEXTROAMPHETAMINE 10 MG PO TABS: 10.0000 mg | ORAL_TABLET | Freq: Every day | ORAL | 0 refills | Status: DC

## 2019-08-10 NOTE — Telephone Encounter (Signed)
Pt is needing a refill on her amphetamine-dextroamphetamine (ADDERALL) 10 MG tablet sent in to the [facility] on [address]

## 2019-08-13 NOTE — Telephone Encounter (Signed)
Done 08-10-19 by Dr. Carolynn Comment.

## 2019-08-14 DIAGNOSIS — N939 Abnormal uterine and vaginal bleeding, unspecified: Secondary | ICD-10-CM | POA: Diagnosis not present

## 2019-08-14 DIAGNOSIS — N952 Postmenopausal atrophic vaginitis: Secondary | ICD-10-CM | POA: Diagnosis not present

## 2019-08-31 ENCOUNTER — Emergency Department (HOSPITAL_COMMUNITY)
Admission: EM | Admit: 2019-08-31 | Discharge: 2019-08-31 | Disposition: A | Discharge status: Home / Self Care | Payer: No Typology Code available for payment source | Attending: Emergency Medicine | Admitting: Emergency Medicine

## 2019-08-31 ENCOUNTER — Emergency Department (HOSPITAL_COMMUNITY): Payer: No Typology Code available for payment source

## 2019-08-31 ENCOUNTER — Encounter (HOSPITAL_COMMUNITY): Payer: Self-pay | Admitting: Pediatrics

## 2019-08-31 ENCOUNTER — Other Ambulatory Visit: Payer: Self-pay

## 2019-08-31 DIAGNOSIS — J984 Other disorders of lung: Secondary | ICD-10-CM | POA: Diagnosis not present

## 2019-08-31 DIAGNOSIS — M25561 Pain in right knee: Secondary | ICD-10-CM | POA: Diagnosis not present

## 2019-08-31 DIAGNOSIS — Z5321 Procedure and treatment not carried out due to patient leaving prior to being seen by health care provider: Secondary | ICD-10-CM | POA: Insufficient documentation

## 2019-08-31 DIAGNOSIS — S199XXA Unspecified injury of neck, initial encounter: Secondary | ICD-10-CM | POA: Diagnosis not present

## 2019-08-31 DIAGNOSIS — M25552 Pain in left hip: Secondary | ICD-10-CM | POA: Diagnosis present

## 2019-08-31 DIAGNOSIS — M25562 Pain in left knee: Secondary | ICD-10-CM | POA: Diagnosis not present

## 2019-08-31 DIAGNOSIS — Y92410 Unspecified street and highway as the place of occurrence of the external cause: Secondary | ICD-10-CM | POA: Insufficient documentation

## 2019-08-31 DIAGNOSIS — Y999 Unspecified external cause status: Secondary | ICD-10-CM | POA: Diagnosis not present

## 2019-08-31 DIAGNOSIS — M47812 Spondylosis without myelopathy or radiculopathy, cervical region: Secondary | ICD-10-CM | POA: Diagnosis not present

## 2019-08-31 DIAGNOSIS — Y9389 Activity, other specified: Secondary | ICD-10-CM | POA: Diagnosis not present

## 2019-08-31 DIAGNOSIS — R200 Anesthesia of skin: Secondary | ICD-10-CM | POA: Diagnosis not present

## 2019-08-31 DIAGNOSIS — M542 Cervicalgia: Secondary | ICD-10-CM | POA: Diagnosis not present

## 2019-08-31 DIAGNOSIS — E041 Nontoxic single thyroid nodule: Secondary | ICD-10-CM | POA: Diagnosis not present

## 2019-08-31 DIAGNOSIS — R52 Pain, unspecified: Secondary | ICD-10-CM | POA: Diagnosis not present

## 2019-08-31 MED ORDER — ONDANSETRON 4 MG PO TBDP
4.0000 mg | ORAL_TABLET | Freq: Once | ORAL | Status: AC | PRN
  Administered 2019-08-31: 4 mg via ORAL
  Filled 2019-08-31: qty 1

## 2019-08-31 MED ORDER — OXYCODONE-ACETAMINOPHEN 5-325 MG PO TABS
1.0000 | ORAL_TABLET | ORAL | Status: DC | PRN
  Administered 2019-08-31: 1 via ORAL
  Filled 2019-08-31: qty 1
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
## 2019-08-31 NOTE — ED Notes (Signed)
Pt LWBS. Pt stated she couldn't wait any longer. Pt was encouraged to stay and be seen by a provider but decided to leave.

## 2019-09-04 ENCOUNTER — Telehealth: Payer: Self-pay | Admitting: Physical Medicine and Rehabilitation

## 2019-09-04 NOTE — Telephone Encounter (Signed)
Patient called. She would like to cancel the appointment with Dr. Ernestina Patches.

## 2019-09-04 NOTE — Telephone Encounter (Signed)
Appointment cancelled per patient request

## 2019-09-05 ENCOUNTER — Ambulatory Visit (INDEPENDENT_AMBULATORY_CARE_PROVIDER_SITE_OTHER): Payer: Medicare Other | Admitting: Orthopaedic Surgery

## 2019-09-05 ENCOUNTER — Ambulatory Visit (INDEPENDENT_AMBULATORY_CARE_PROVIDER_SITE_OTHER): Payer: Medicare Other

## 2019-09-05 ENCOUNTER — Encounter: Payer: Self-pay | Admitting: Orthopaedic Surgery

## 2019-09-05 DIAGNOSIS — M545 Low back pain, unspecified: Secondary | ICD-10-CM

## 2019-09-05 DIAGNOSIS — M25551 Pain in right hip: Secondary | ICD-10-CM
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
## 2019-09-07 ENCOUNTER — Encounter: Payer: Medicare Other | Admitting: Physical Medicine and Rehabilitation

## 2019-09-10 ENCOUNTER — Other Ambulatory Visit: Payer: Self-pay | Admitting: Adult Health

## 2019-09-10 ENCOUNTER — Ambulatory Visit: Payer: Medicare Other | Admitting: Endocrinology

## 2019-09-10 DIAGNOSIS — S22081A Stable burst fracture of T11-T12 vertebra, initial encounter for closed fracture: Secondary | ICD-10-CM | POA: Diagnosis not present

## 2019-09-10 DIAGNOSIS — M4712 Other spondylosis with myelopathy, cervical region: Secondary | ICD-10-CM | POA: Diagnosis not present

## 2019-09-10 NOTE — Telephone Encounter (Signed)
Last seen 03-28-19 MM.  Next appt 03-27-2020.

## 2019-09-10 NOTE — Telephone Encounter (Signed)
Pt request refill amphetamine-dextroamphetamine (ADDERALL) 10 MG tablet at [id number] 

## 2019-09-10 NOTE — Addendum Note (Signed)
Addended by: Brandon Melnick on: 09/10/2019 11:37 AM   Modules accepted: Orders

## 2019-09-11 NOTE — Telephone Encounter (Signed)
Pt has called to check on the status of her phetamine-dextroamphetamine (ADDERALL) 10 MG tablet being called in becasuse she is completely out.

## 2019-09-13 ENCOUNTER — Telehealth: Payer: Self-pay | Admitting: Adult Health

## 2019-09-13 DIAGNOSIS — Z23 Encounter for immunization: Secondary | ICD-10-CM | POA: Diagnosis not present

## 2019-09-13 MED ORDER — AMPHETAMINE-DEXTROAMPHETAMINE 10 MG PO TABS: 10.0000 mg | ORAL_TABLET | Freq: Every day | ORAL | 0 refills | Status: DC

## 2019-09-13 NOTE — Telephone Encounter (Signed)
Called pt, informed her rx called into pharmacy. She verbalized understanding.

## 2019-09-13 NOTE — Telephone Encounter (Signed)
This was already e-scribe by MM,NP today.

## 2019-09-13 NOTE — Addendum Note (Signed)
Addended by: Wyvonnia Lora on: 09/13/2019 08:14 AM   Modules accepted: Orders

## 2019-09-13 NOTE — Telephone Encounter (Signed)
Pt is requesting a refill for amphetamine-dextroamphetamine (ADDERALL) 10 MG tablet .  Pharmacy: [id number]  

## 2019-09-19 DIAGNOSIS — M4712 Other spondylosis with myelopathy, cervical region: Secondary | ICD-10-CM | POA: Diagnosis not present

## 2019-09-19 DIAGNOSIS — M542 Cervicalgia: Secondary | ICD-10-CM | POA: Diagnosis not present

## 2019-10-07 ENCOUNTER — Emergency Department (HOSPITAL_COMMUNITY): Payer: Medicare Other

## 2019-10-07 ENCOUNTER — Emergency Department (HOSPITAL_COMMUNITY)
Admission: EM | Admit: 2019-10-07 | Discharge: 2019-10-08 | Disposition: A | Discharge status: Home / Self Care | Payer: Medicare Other | Attending: Emergency Medicine | Admitting: Emergency Medicine

## 2019-10-07 DIAGNOSIS — Z23 Encounter for immunization: Secondary | ICD-10-CM | POA: Diagnosis not present

## 2019-10-07 DIAGNOSIS — R110 Nausea: Secondary | ICD-10-CM | POA: Diagnosis not present

## 2019-10-07 DIAGNOSIS — R42 Dizziness and giddiness: Secondary | ICD-10-CM | POA: Diagnosis not present

## 2019-10-07 DIAGNOSIS — R1110 Vomiting, unspecified: Secondary | ICD-10-CM | POA: Insufficient documentation

## 2019-10-07 DIAGNOSIS — R079 Chest pain, unspecified: Secondary | ICD-10-CM | POA: Diagnosis not present

## 2019-10-07 DIAGNOSIS — Z5321 Procedure and treatment not carried out due to patient leaving prior to being seen by health care provider: Secondary | ICD-10-CM | POA: Insufficient documentation

## 2019-10-07 DIAGNOSIS — R0789 Other chest pain: Secondary | ICD-10-CM | POA: Diagnosis not present

## 2019-10-07 DIAGNOSIS — R112 Nausea with vomiting, unspecified: Secondary | ICD-10-CM | POA: Diagnosis not present

## 2019-10-07 DIAGNOSIS — J439 Emphysema, unspecified: Secondary | ICD-10-CM | POA: Diagnosis not present

## 2019-10-07 LAB — CBC
HCT: 42.3 % (ref 36.0–46.0)
Hemoglobin: 13.7 g/dL (ref 12.0–15.0)
MCH: 32.6 pg (ref 26.0–34.0)
MCHC: 32.4 g/dL (ref 30.0–36.0)
MCV: 100.7 fL — ABNORMAL HIGH (ref 80.0–100.0)
Platelets: 312 10*3/uL (ref 150–400)
RBC: 4.2 MIL/uL (ref 3.87–5.11)
RDW: 13.1 % (ref 11.5–15.5)
WBC: 16.6 10*3/uL — ABNORMAL HIGH (ref 4.0–10.5)
nRBC: 0 % (ref 0.0–0.2)

## 2019-10-07 LAB — TROPONIN I (HIGH SENSITIVITY): Troponin I (High Sensitivity): 4 ng/L (ref <18)

## 2019-10-07 LAB — BASIC METABOLIC PANEL
Anion gap: 12 (ref 5–15)
BUN: 15 mg/dL (ref 8–23)
CO2: 31 mmol/L (ref 22–32)
Calcium: 9.5 mg/dL (ref 8.9–10.3)
Chloride: 97 mmol/L — ABNORMAL LOW (ref 98–111)
Creatinine, Ser: 0.76 mg/dL (ref 0.44–1.00)
GFR calc Af Amer: >60 mL/min (ref >60)
GFR calc non Af Amer: >60 mL/min (ref >60)
Glucose, Bld: 114 mg/dL — ABNORMAL HIGH (ref 70–99)
Potassium: 3.8 mmol/L (ref 3.5–5.1)
Sodium: 140 mmol/L (ref 135–145)

## 2019-10-07 NOTE — ED Triage Notes (Signed)
Pt reports that she had her covid booster shot today and began to have vomiting after. PTA received 4mg  zofran IV, pt also having CP

## 2019-10-07 NOTE — ED Notes (Signed)
Patient asked to have IV removed, she also stated that she would like to leave. Spoke with Janett Billow triage RN. About same. IN removed.

## 2019-10-07 NOTE — ED Notes (Signed)
Lwbs. 

## 2019-10-10 ENCOUNTER — Other Ambulatory Visit: Payer: Self-pay | Admitting: Adult Health

## 2019-10-10 MED ORDER — AMPHETAMINE-DEXTROAMPHETAMINE 10 MG PO TABS: 10.0000 mg | ORAL_TABLET | Freq: Every day | ORAL | 0 refills | Status: DC

## 2019-10-10 NOTE — Telephone Encounter (Signed)
Pt request refill amphetamine-dextroamphetamine (ADDERALL) 10 MG tablet at [id number] 

## 2019-10-12 ENCOUNTER — Ambulatory Visit: Payer: Medicare Other | Admitting: Endocrinology

## 2019-10-16 ENCOUNTER — Ambulatory Visit: Payer: Medicare Other | Admitting: Endocrinology

## 2019-10-23 DIAGNOSIS — Z1231 Encounter for screening mammogram for malignant neoplasm of breast: Secondary | ICD-10-CM | POA: Diagnosis not present

## 2019-11-05 ENCOUNTER — Other Ambulatory Visit: Payer: Self-pay

## 2019-11-05 ENCOUNTER — Encounter: Payer: Self-pay | Admitting: Orthopaedic Surgery

## 2019-11-05 ENCOUNTER — Ambulatory Visit (INDEPENDENT_AMBULATORY_CARE_PROVIDER_SITE_OTHER): Payer: Medicare Other | Admitting: Orthopaedic Surgery

## 2019-11-05 DIAGNOSIS — M545 Low back pain, unspecified: Secondary | ICD-10-CM

## 2019-11-05 DIAGNOSIS — M25551 Pain in right hip: Secondary | ICD-10-CM
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
## 2019-11-08 ENCOUNTER — Other Ambulatory Visit: Payer: Self-pay | Admitting: Adult Health

## 2019-11-08 MED ORDER — AMPHETAMINE-DEXTROAMPHETAMINE 10 MG PO TABS: 10.0000 mg | ORAL_TABLET | Freq: Every day | ORAL | 0 refills | Status: DC

## 2019-11-08 NOTE — Telephone Encounter (Signed)
Pt is needing a refill on her amphetamine-dextroamphetamine (ADDERALL) 10 MG tablet sent to the [facility] on [address]

## 2019-11-09 DIAGNOSIS — S22081A Stable burst fracture of T11-T12 vertebra, initial encounter for closed fracture: Secondary | ICD-10-CM | POA: Diagnosis not present

## 2019-11-09 DIAGNOSIS — I10 Essential (primary) hypertension: Secondary | ICD-10-CM | POA: Diagnosis not present

## 2019-11-14 ENCOUNTER — Other Ambulatory Visit: Payer: Self-pay

## 2019-11-14 ENCOUNTER — Encounter: Payer: Self-pay | Admitting: Physical Therapy

## 2019-11-14 ENCOUNTER — Ambulatory Visit (INDEPENDENT_AMBULATORY_CARE_PROVIDER_SITE_OTHER): Payer: Medicare Other | Admitting: Physical Therapy

## 2019-11-14 DIAGNOSIS — R262 Difficulty in walking, not elsewhere classified: Secondary | ICD-10-CM | POA: Diagnosis not present

## 2019-11-14 DIAGNOSIS — M545 Low back pain, unspecified: Secondary | ICD-10-CM | POA: Diagnosis not present

## 2019-11-14 DIAGNOSIS — M6281 Muscle weakness (generalized): Secondary | ICD-10-CM

## 2019-11-14 DIAGNOSIS — G8929 Other chronic pain: Secondary | ICD-10-CM | POA: Diagnosis not present

## 2019-11-14 NOTE — Patient Instructions (Signed)
Access Code: [email or web address] URL: [email or web address] Date: 11/14/2019 Prepared by: Kearney Hard  Exercises Static Prone on Elbows - 2 x daily - 7 x weekly - 3-5 minutes hold Supine Bridge - 2 x daily - 7 x weekly - 2 sets - 10 reps - 5 seconds hold Hooklying Single Knee to Chest Stretch - 2 x daily - 7 x weekly - 5 reps - 10 seconds hold Supine Lower Trunk Rotation - 2 x daily - 7 x weekly - 3 reps - 10 seconds hold
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
## 2019-11-21 ENCOUNTER — Other Ambulatory Visit: Payer: Self-pay

## 2019-11-21 ENCOUNTER — Ambulatory Visit (INDEPENDENT_AMBULATORY_CARE_PROVIDER_SITE_OTHER): Payer: Medicare Other | Admitting: Physical Therapy

## 2019-11-21 ENCOUNTER — Encounter: Payer: Self-pay | Admitting: Physical Therapy

## 2019-11-21 DIAGNOSIS — M6281 Muscle weakness (generalized): Secondary | ICD-10-CM | POA: Diagnosis not present

## 2019-11-21 DIAGNOSIS — G8929 Other chronic pain: Secondary | ICD-10-CM

## 2019-11-21 DIAGNOSIS — M545 Low back pain, unspecified: Secondary | ICD-10-CM | POA: Diagnosis not present

## 2019-11-21 DIAGNOSIS — R262 Difficulty in walking, not elsewhere classified: Secondary | ICD-10-CM

## 2019-11-21 NOTE — Therapy (Signed)
[facility] [address] [place], [place], [address] Phone: [phone]   Fax:  [phone]  Physical Therapy Treatment  Patient Details  Name: Angela [name] MRN: [id number] Date of Birth: 07-Dec-1945 Referring Provider (PT): Doneen Poisson MD   Encounter Date: 11/21/2019   PT End of Session - 11/21/19 0926    Visit Number 2    Number of Visits 13    Date for PT Re-Evaluation 01/11/20    PT Start Time 0846    PT Stop Time 0925    PT Time Calculation (min) 39 min    Activity Tolerance Patient tolerated treatment well    Behavior During Therapy [facility] for tasks assessed/performed           Past Medical History:  Diagnosis Date  . Cancer ([facility])    skin  . Chronic pain   . Complication of anesthesia   . 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 Headache   . Hypertension   . Hypothyroidism   . Joint disorder   . Mitral valve prolapse   . Pneumonia   . PONV (postoperative nausea and vomiting)   . 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 Sleep apnea     Past Surgical History:  Procedure Laterality Date  . ABDOMINAL HYSTERECTOMY     Age 30  . ANKLE SURGERY     x3  . APPENDECTOMY    . BIOPSY  10/19/2018   Procedure: BIOPSY;  Surgeon: Kerin Salen, MD;  Location: [facility];  Service: Gastroenterology;;  . COLONOSCOPY WITH PROPOFOL N/A 10/19/2018   Procedure: COLONOSCOPY WITH PROPOFOL;  Surgeon: Kerin Salen, MD;  Location: [facility];  Service: Gastroenterology;  Laterality: N/A;  . DILATION AND CURETTAGE OF UTERUS     x 4  . ESOPHAGOGASTRODUODENOSCOPY (EGD) WITH PROPOFOL N/A 10/19/2018   Procedure: ESOPHAGOGASTRODUODENOSCOPY (EGD) WITH PROPOFOL;  Surgeon: Kerin Salen, MD;  [facility]: [facility];  Service: Gastroenterology;  Laterality: N/A;  . FRACTURE SURGERY     jaw  . KNEE SURGERY     x 3  .  KYPHOPLASTY N/A 08/07/2019   Procedure: Thoracic twelve Vertebral Augmentation with Stenting/SpineJack;  Surgeon: Bedelia Person, MD;  Location: [facility] OR;  Service: Neurosurgery;  Laterality: N/A;  . MANDIBLE FRACTURE SURGERY     x 19  . POLYPECTOMY  10/19/2018   Procedure: POLYPECTOMY;  Surgeon: Kerin Salen, MD;  Location: [facility];  Service: Gastroenterology;;    There were no vitals filed for this visit.   Subjective Assessment - 11/21/19 0848    Subjective needs to leave by 9:30am today as she has to go into work.  back pain has been better over the past couple of days, has some good days/bad days.  minimally compliant with exercises - does a few, then a couple days off then will do them again.    Pertinent History back surgery 08/07/2019, 19 jaw surgeries, HTN, h/o skin cancer, ankle surgery    Diagnostic tests x-ray    Patient Stated Goals I want the pain to stop    Currently in Pain? No/denies                             [facility] Adult PT Treatment/Exercise - 11/21/19 0850      Exercises   Exercises Lumbar      Lumbar Exercises:  Stretches   Single Knee to Chest Stretch 5 reps;10 seconds;Right;Left    Lower Trunk Rotation 5 reps;10 seconds   bil   Prone on Elbows Stretch 3 reps;60 seconds   continuous     Lumbar Exercises: Aerobic   Nustep L6 x 8 min      Lumbar Exercises: Supine   Bridge 20 reps;5 seconds                       PT Long Term Goals - 11/14/19 0913      PT LONG TERM GOAL #1   Title Pt will be independent in her HEP and progression.    Time 6    Period Weeks    Status New    [place] Date 01/11/20      PT LONG TERM GOAL #2   Title Pt will be able to amb 20 minutes on community level surfaces with no pain.    Baseline pain with walking into [employer]    Time 6    Period Weeks    Status New    [place] Date 01/11/20      PT LONG TERM GOAL #3   Title Pt will improve her hip strength to 5/5 in order to improve funciton and  gait.    Time 6    Period Weeks    Status New    Target Date 01/11/20      PT LONG TERM GOAL #4   Title Be able to stand in kitchen for cooking with pain </= 2/10.    Time 6    Period Weeks    Status New    [place] Date 01/11/20      PT LONG TERM GOAL #5   Title Pt will improve her FOTO score from 71% limitiation to </= 60% limitaiton.    Time 6    Period Weeks    Status New                 Plan - 11/21/19 9115    Clinical Impression Statement Session today focusing on HEP review needing mod cues for technique.  No goals met as only 2nd visit.  Will continue to benefit from PT to maximize function.    Personal Factors and Comorbidities Comorbidity 3+    Comorbidities HTN, kyphoplasty, skin CA, 19 jaw surgeries    Examination-Activity Limitations Transfers;Sit;Stairs;Squat;Dressing;Lift    Examination-Participation Restrictions Community Activity;Other    Stability/Clinical Decision Making Stable/Uncomplicated    Rehab Potential Good    PT Frequency 2x / week    PT Duration 6 weeks    PT Treatment/Interventions ADLs/Self Care Home Management;Cryotherapy;Physicist, medical;Therapeutic exercise;Therapeutic activities;Functional mobility training;Stair training;Gait training;Neuromuscular re-education;Patient/family education;Passive range of motion;Manual techniques;Dry needling;Taping    PT Next Visit Plan core strengthening, lumbar stretching, hip strengthening, dynamic balance    PT Home Exercise Plan [email or web address]    Consulted and Agree with Plan of Care Patient           Patient will benefit from skilled therapeutic intervention in order to improve the following deficits and impairments:  Pain, Decreased strength, Postural dysfunction, Decreased balance, Difficulty walking, Impaired flexibility  Visit Diagnosis: Chronic bilateral low back pain without sciatica  Muscle weakness (generalized)  Difficulty in walking, not  elsewhere classified     Problem List Patient Active Problem List   Diagnosis Date Noted  . Attention deficit hyperactivity disorder, predominantly inattentive type 07/25/2019  . Chronic pain syndrome 07/25/2019  . Irritable bowel  syndrome 07/25/2019  . Urethral caruncle 07/25/2019  . Thyroid dysfunction 07/25/2019  . Osteoporosis 07/09/2019  . Hyperprolactinemia ([place]) 07/09/2019  . Trochanteric bursitis, left hip 06/28/2017  . Chronic, continuous use of opioids 10/12/2016  . 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 Other retention of urine 10/12/2016  . Vaginal atrophy 10/12/2016  . 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 New onset headache 01/19/2016  . Excessive sleepiness 01/19/2016  . HYPERTENSION, BENIGN 03/10/2009  . HYPERTENSION, UNCONTROLLED 01/29/2009  . ARM PAIN, LEFT 01/29/2009      Laureen Abrahams, PT, DPT 11/21/19 9:28 AM     [facility] [address] [place], [place], [address] Phone: [phone]   Fax:  [phone]  Name: Angela [name] MRN: [id number] Date of Birth: [birth year]

## 2019-11-23 ENCOUNTER — Encounter: Payer: Medicare Other | Admitting: Physical Therapy

## 2019-11-27 ENCOUNTER — Ambulatory Visit (INDEPENDENT_AMBULATORY_CARE_PROVIDER_SITE_OTHER): Payer: Medicare Other | Admitting: Physical Therapy

## 2019-11-27 ENCOUNTER — Encounter: Payer: Self-pay | Admitting: Physical Therapy

## 2019-11-27 ENCOUNTER — Other Ambulatory Visit: Payer: Self-pay

## 2019-11-27 DIAGNOSIS — M545 Low back pain, unspecified: Secondary | ICD-10-CM | POA: Diagnosis not present

## 2019-11-27 DIAGNOSIS — M6281 Muscle weakness (generalized): Secondary | ICD-10-CM

## 2019-11-27 DIAGNOSIS — R262 Difficulty in walking, not elsewhere classified: Secondary | ICD-10-CM

## 2019-11-27 DIAGNOSIS — G8929 Other chronic pain: Secondary | ICD-10-CM | POA: Diagnosis not present

## 2019-11-27 NOTE — Patient Instructions (Addendum)
Access Code: [email or web address] URL: [email or web address] Date: 11/27/2019 Prepared by: Kearney Hard  Exercises Static Prone on Elbows - 2 x daily - 7 x weekly - 3-5 minutes hold Supine Bridge - 2 x daily - 7 x weekly - 2 sets - 10 reps - 5 seconds hold Hooklying Single Knee to Chest Stretch - 2 x daily - 7 x weekly - 5 reps - 10 seconds hold Supine Lower Trunk Rotation - 2 x daily - 7 x weekly - 3 reps - 10 seconds hold Supine Piriformis Stretch with Foot on Ground - 2 x daily - 7 x weekly - 3 reps - 30 seconds hold Supine Cervical Retraction with Towel - 2 x daily - 7 x weekly - 5 reps - 5 seconds hold Gentle Levator Scapulae Stretch - 2 x daily - 7 x weekly - 5 reps - 5 seconds hold Hip flexor stretch: 2x day, 3 reps holding 60 seconds

## 2019-11-27 NOTE — Therapy (Signed)
[facility] Physical Therapy [address] [place], [place], [address] Phone: [phone]   Fax:  [phone]  Physical Therapy Treatment  Patient Details  Name: Angela [name] MRN: [id number] Date of Birth: 1945/03/04 Referring Provider (PT): Jean Rosenthal MD   Encounter Date: 11/27/2019   PT End of Session - 11/27/19 1024    Visit Number 3    Number of Visits 13    Date for PT Re-Evaluation 01/11/20    PT Start Time 5397    PT Stop Time 1055    PT Time Calculation (min) 40 min    Activity Tolerance Patient tolerated treatment well    Behavior During Therapy [facility] for tasks assessed/performed           Past Medical History:  Diagnosis Date  . Cancer ([place])    skin  . Chronic pain   . Complication of anesthesia   . 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 Headache   . Hypertension   . Hypothyroidism   . Joint disorder   . Mitral valve prolapse   . Pneumonia   . PONV (postoperative nausea and vomiting)   . 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 Sleep apnea     Past Surgical History:  Procedure Laterality Date  . ABDOMINAL HYSTERECTOMY     Age 82  . ANKLE SURGERY     x3  . APPENDECTOMY    . BIOPSY  10/19/2018   Procedure: BIOPSY;  Surgeon: Ronnette Juniper, MD;  [facility];  Service: Gastroenterology;;  . COLONOSCOPY WITH PROPOFOL N/A 10/19/2018   Procedure: COLONOSCOPY WITH PROPOFOL;  Surgeon: Ronnette Juniper, MD;  [facility];  Service: Gastroenterology;  Laterality: N/A;  . DILATION AND CURETTAGE OF UTERUS     x 4  . ESOPHAGOGASTRODUODENOSCOPY (EGD) WITH PROPOFOL N/A 10/19/2018   Procedure: ESOPHAGOGASTRODUODENOSCOPY (EGD) WITH PROPOFOL;  Surgeon: Ronnette Juniper, MD;  [facility];  Service: Gastroenterology;  Laterality: N/A;  . FRACTURE SURGERY     jaw  . KNEE SURGERY     x 3  .  KYPHOPLASTY N/A 08/07/2019   Procedure: Thoracic twelve Vertebral Augmentation with Stenting/SpineJack;  Surgeon: Vallarie Mare, MD;  Location: [place];  Service: Neurosurgery;  Laterality: N/A;  . MANDIBLE FRACTURE SURGERY     x 19  . POLYPECTOMY  10/19/2018   Procedure: POLYPECTOMY;  Surgeon: Ronnette Juniper, MD;  [facility];  Service: Gastroenterology;;    There were no vitals filed for this visit.   Subjective Assessment - 11/27/19 1021    Subjective Pt arriving to therapy reporting back pain first thing this morning when waking up. Pt also reporting neck pain.    Pertinent History back surgery 08/07/2019, 19 jaw surgeries, HTN, h/o skin cancer, ankle surgery    Diagnostic tests x-ray    Patient Stated Goals I want the pain to stop    Currently in Pain? Yes    Pain Score 6     Pain Location Back    Pain Orientation Lower;Right    Pain Descriptors / Indicators Aching;Sore    Pain Type Chronic pain    Pain Onset More than a month ago    Multiple Pain Sites Yes    Pain Score 8    Pain Location Neck    Pain Orientation Posterior    Pain Descriptors / Indicators Aching    Pain Type  Chronic pain    Pain Onset More than a month ago    Pain Frequency Intermittent                             [facility] Adult PT Treatment/Exercise - 11/27/19 0001      Exercises   Exercises Other Exercises    Other Exercises  cervical retraction with chin tuck x 10 holding 5 seconds, levator stretch x 5 holding 5 seconds      Lumbar Exercises: Stretches   Single Knee to Chest Stretch 5 reps;10 seconds;Right;Left    Lower Trunk Rotation 5 reps;10 seconds   bil   Prone on Elbows Stretch 3 reps;60 seconds   continuous     Lumbar Exercises: Aerobic   Nustep L6 x 8 min      Lumbar Exercises: Supine   Bridge 20 reps;5 seconds                       PT Long Term Goals - 11/27/19 1027      PT LONG TERM GOAL #1   Title Pt will be independent in her HEP and  progression.    Status On-going      PT LONG TERM GOAL #2   Title Pt will be able to amb 20 minutes on community level surfaces with no pain.    Baseline pain with walking into [employer]    Status On-going      PT LONG TERM GOAL #3   Title Pt will improve her hip strength to 5/5 in order to improve funciton and gait.    Status On-going      PT LONG TERM GOAL #4   Title Be able to stand in kitchen for cooking with pain </= 2/10.    Status On-going      PT LONG TERM GOAL #5   Title Pt will improve her FOTO score from 71% limitiation to </= 60% limitaiton.    Status On-going                 Plan - 11/27/19 1025    Clinical Impression Statement Pt tolerating exericses well today reproting less pain after moving around. No goals met this session. We added hip flexor stretch to pt's HEP. Continue skilled PT.    Personal Factors and Comorbidities Comorbidity 3+    Comorbidities HTN, kyphoplasty, skin CA, 19 jaw surgeries    Examination-Participation Restrictions Community Activity;Other    Stability/Clinical Decision Making Stable/Uncomplicated    Rehab Potential Good    PT Frequency 2x / week    PT Duration 6 weeks    PT Treatment/Interventions ADLs/Self Care Home Management;Cryotherapy;Presenter, broadcasting;Therapeutic exercise;Therapeutic activities;Functional mobility training;Stair training;Gait training;Neuromuscular re-education;Patient/family education;Passive range of motion;Manual techniques;Dry needling;Taping    PT Next Visit Plan core strengthening, lumbar stretching, hip strengthening, dynamic balance    PT Home Exercise Plan [email or web address]    Consulted and Agree with Plan of Care Patient           Patient will benefit from skilled therapeutic intervention in order to improve the following deficits and impairments:  Pain, Decreased strength, Postural dysfunction, Decreased balance, Difficulty walking, Impaired flexibility  Visit  Diagnosis: Chronic bilateral low back pain without sciatica  Muscle weakness (generalized)  Difficulty in walking, not elsewhere classified     Problem List Patient Active Problem List   Diagnosis Date Noted  . Attention deficit hyperactivity disorder, predominantly inattentive  type 07/25/2019  . Chronic pain syndrome 07/25/2019  . Irritable bowel syndrome 07/25/2019  . Urethral caruncle 07/25/2019  . Thyroid dysfunction 07/25/2019  . Osteoporosis 07/09/2019  . Hyperprolactinemia ([place]) 07/09/2019  . Trochanteric bursitis, left hip 06/28/2017  . Chronic, continuous use of opioids 10/12/2016  . 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 Other retention of urine 10/12/2016  . Vaginal atrophy 10/12/2016  . 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 New onset headache 01/19/2016  . Excessive sleepiness 01/19/2016  . HYPERTENSION, BENIGN 03/10/2009  . HYPERTENSION, UNCONTROLLED 01/29/2009  . ARM PAIN, LEFT 01/29/2009    Oretha Caprice, PT, MPT 11/27/2019, 10:43 AM  [facility] Physical Therapy [address] [place], [place], [address] Phone: [phone]   Fax:  [phone]  Name: Angela [name] MRN: [id number] Date of Birth: 1945-04-16

## 2019-11-29 ENCOUNTER — Telehealth: Payer: Self-pay | Admitting: Physical Therapy

## 2019-11-29 ENCOUNTER — Encounter: Payer: Medicare Other | Admitting: Physical Therapy

## 2019-11-29 NOTE — Telephone Encounter (Signed)
Spoke to pt who had PT appt days mixed up.  Confirmed next appt with pt.  Laureen Abrahams, PT, DPT 11/29/19 12:06 PM

## 2019-12-03 ENCOUNTER — Encounter: Payer: Self-pay | Admitting: Orthopaedic Surgery

## 2019-12-03 ENCOUNTER — Ambulatory Visit (INDEPENDENT_AMBULATORY_CARE_PROVIDER_SITE_OTHER): Payer: Medicare Other | Admitting: Orthopaedic Surgery

## 2019-12-03 DIAGNOSIS — M545 Low back pain, unspecified: Secondary | ICD-10-CM

## 2019-12-03 NOTE — Progress Notes (Signed)
The patient continues to follow-up for her lumbar spine after having a kyphoplasty at the lower thoracic spine.  She is doing much better with therapy.  She is only been to 3 visits.  She has more good days than bad days is related to her back.  She denies any radicular symptoms.  She is very thin individual.  She does have some pain to palpation along the midline of her lower thoracic and lumbar spine.  She has a negative straight leg raise to the right side.  This used to be positive.  She has excellent strength in the bilateral lower extremities.  She will continue therapy and home exercise program.  Since she is doing so well I can release her for follow-up as needed unless things worsen in any way.  She knows to let us know.  All questions and concerns were answered and addressed.

## 2019-12-04 ENCOUNTER — Ambulatory Visit (INDEPENDENT_AMBULATORY_CARE_PROVIDER_SITE_OTHER): Payer: Medicare Other | Admitting: Physical Therapy

## 2019-12-04 ENCOUNTER — Encounter: Payer: Self-pay | Admitting: Physical Therapy

## 2019-12-04 ENCOUNTER — Other Ambulatory Visit: Payer: Self-pay

## 2019-12-04 DIAGNOSIS — M6281 Muscle weakness (generalized): Secondary | ICD-10-CM | POA: Diagnosis not present

## 2019-12-04 DIAGNOSIS — R262 Difficulty in walking, not elsewhere classified: Secondary | ICD-10-CM | POA: Diagnosis not present

## 2019-12-04 DIAGNOSIS — M545 Low back pain, unspecified: Secondary | ICD-10-CM

## 2019-12-04 DIAGNOSIS — G8929 Other chronic pain: Secondary | ICD-10-CM

## 2019-12-04 NOTE — Therapy (Signed)
[facility] Physical Therapy [address] [place], [place], [address] Phone: [phone]   Fax:  [phone]  Physical Therapy Treatment  Patient Details  Name: Angela [name] MRN: [id number] Date of Birth: 12/28/1945 Referring Provider (PT): Jean Rosenthal MD   Encounter Date: 12/04/2019   PT End of Session - 12/04/19 0810    Visit Number 4    Number of Visits 13    Date for PT Re-Evaluation 01/11/20    PT Start Time 0802    PT Stop Time 1157    PT Time Calculation (min) 41 min    Activity Tolerance Patient tolerated treatment well    Behavior During Therapy [facility] for tasks assessed/performed           Past Medical History:  Diagnosis Date   Cancer ([place])    skin   Chronic pain    Complication of anesthesia    Difficult intubation    19 jaw/TMJ surgeries with "bone fused to skull" leading to very limited mouth opening   Headache    Hypertension    Hypothyroidism    Joint disorder    Mitral valve prolapse    Pneumonia    PONV (postoperative nausea and vomiting)    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)   Sleep apnea     Past Surgical History:  Procedure Laterality Date   ABDOMINAL HYSTERECTOMY     Age 74   ANKLE SURGERY     x3   APPENDECTOMY     BIOPSY  10/19/2018   Procedure: BIOPSY;  Surgeon: Ronnette Juniper, MD;  [facility];  Service: Gastroenterology;;   COLONOSCOPY WITH PROPOFOL N/A 10/19/2018   Procedure: COLONOSCOPY WITH PROPOFOL;  Surgeon: Ronnette Juniper, MD;  [facility];  Service: Gastroenterology;  Laterality: N/A;   DILATION AND CURETTAGE OF UTERUS     x 4   ESOPHAGOGASTRODUODENOSCOPY (EGD) WITH PROPOFOL N/A 10/19/2018   Procedure: ESOPHAGOGASTRODUODENOSCOPY (EGD) WITH PROPOFOL;  Surgeon: Ronnette Juniper, MD;  [facility];  Service: Gastroenterology;  Laterality: N/A;   FRACTURE SURGERY     jaw   KNEE SURGERY     x 3    KYPHOPLASTY N/A 08/07/2019   Procedure: Thoracic twelve Vertebral Augmentation with Stenting/SpineJack;  Surgeon: Vallarie Mare, MD;  Location: [place];  Service: Neurosurgery;  Laterality: N/A;   MANDIBLE FRACTURE SURGERY     x 19   POLYPECTOMY  10/19/2018   Procedure: POLYPECTOMY;  Surgeon: Ronnette Juniper, MD;  [facility];  Service: Gastroenterology;;    There were no vitals filed for this visit.   Subjective Assessment - 12/04/19 0806    Subjective Pt arriving to therapy reporting 4/10 LBP.    Pertinent History back surgery 08/07/2019, 19 jaw surgeries, HTN, h/o skin cancer, ankle surgery    Diagnostic tests x-ray    Patient Stated Goals I want the pain to stop    Currently in Pain? Yes    Pain Score 4     Pain Location Back    Pain Orientation Right;Lower    Pain Descriptors / Indicators Aching;Sore    Pain Type Chronic pain    Pain Onset More than a month ago                             [facility] Adult PT Treatment/Exercise - 12/04/19 0001      Exercises   Exercises Other Exercises    Other  Exercises  cervical retraction with chin tuck x 10 holding 5 seconds, levator stretch x 5 holding 5 seconds, wall angles x 5       Lumbar Exercises: Stretches   Single Knee to Chest Stretch 5 reps;10 seconds;Right;Left    Lower Trunk Rotation Limitations seated using green physioball rotating side to side    Prone on Elbows Stretch 3 reps;60 seconds   continuous     Lumbar Exercises: Aerobic   Nustep L6 x 8 min      Lumbar Exercises: Seated   Other Seated Lumbar Exercises flexion over physioball x 5 holding 5 seconds each      Lumbar Exercises: Supine   Bridge 20 reps;5 seconds                       PT Long Term Goals - 12/04/19 0824      PT LONG TERM GOAL #1   Title Pt will be independent in her HEP and progression.    Status On-going      PT LONG TERM GOAL #2   Title Pt will be able to amb 20 minutes on community level surfaces with  no pain.    Status On-going      PT LONG TERM GOAL #3   Title Pt will improve her hip strength to 5/5 in order to improve funciton and gait.    Status On-going      PT LONG TERM GOAL #4   Title Be able to stand in kitchen for cooking with pain </= 2/10.    Status On-going      PT LONG TERM GOAL #5   Title Pt will improve her FOTO score from 71% limitiation to </= 60% limitaiton.    Status On-going                 Plan - 12/04/19 8588    Clinical Impression Statement Pt arriving reporting 4/10 pain upon arrival. Pt tolerating exericses well. Pt reporting,"I like the exercises I'm doing". No goals met this session. Continue skilled PT.    Personal Factors and Comorbidities Comorbidity 3+    Comorbidities HTN, kyphoplasty, skin CA, 19 jaw surgeries    Examination-Activity Limitations Transfers;Sit;Stairs;Squat;Dressing;Lift    Examination-Participation Restrictions Community Activity;Other    Stability/Clinical Decision Making Stable/Uncomplicated    Rehab Potential Good    PT Frequency 2x / week    PT Duration 6 weeks    PT Treatment/Interventions ADLs/Self Care Home Management;Cryotherapy;Presenter, broadcasting;Therapeutic exercise;Therapeutic activities;Functional mobility training;Stair training;Gait training;Neuromuscular re-education;Patient/family education;Passive range of motion;Manual techniques;Dry needling;Taping    PT Next Visit Plan core strengthening, lumbar stretching, hip strengthening, dynamic balance    PT Home Exercise Plan [email or web address]    Consulted and Agree with Plan of Care Patient           Patient will benefit from skilled therapeutic intervention in order to improve the following deficits and impairments:  Pain, Decreased strength, Postural dysfunction, Decreased balance, Difficulty walking, Impaired flexibility  Visit Diagnosis: Chronic bilateral low back pain without sciatica  Muscle weakness  (generalized)  Difficulty in walking, not elsewhere classified     Problem List Patient Active Problem List   Diagnosis Date Noted   Attention deficit hyperactivity disorder, predominantly inattentive type 07/25/2019   Chronic pain syndrome 07/25/2019   Irritable bowel syndrome 07/25/2019   Urethral caruncle 07/25/2019   Thyroid dysfunction 07/25/2019   Osteoporosis 07/09/2019   Hyperprolactinemia ([place]) 07/09/2019   Trochanteric bursitis, left hip 06/28/2017  Chronic, continuous use of opioids 10/12/2016   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/12/2016   Other retention of urine 10/12/2016   Vaginal atrophy 10/12/2016   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 [place], [place], [address] Phone: [phone]   Fax:  [phone]  Name: Angela [name] MRN: [id number] Date of Birth: July 13, 1945

## 2019-12-05 ENCOUNTER — Encounter: Payer: Medicare Other | Admitting: Physical Therapy

## 2019-12-11 ENCOUNTER — Encounter: Payer: Self-pay | Admitting: Physical Therapy

## 2019-12-11 ENCOUNTER — Other Ambulatory Visit: Payer: Self-pay

## 2019-12-11 ENCOUNTER — Ambulatory Visit (INDEPENDENT_AMBULATORY_CARE_PROVIDER_SITE_OTHER): Payer: Medicare Other | Admitting: Physical Therapy

## 2019-12-11 DIAGNOSIS — G8929 Other chronic pain: Secondary | ICD-10-CM

## 2019-12-11 DIAGNOSIS — R262 Difficulty in walking, not elsewhere classified: Secondary | ICD-10-CM

## 2019-12-11 DIAGNOSIS — M6281 Muscle weakness (generalized): Secondary | ICD-10-CM

## 2019-12-11 DIAGNOSIS — M545 Low back pain, unspecified: Secondary | ICD-10-CM | POA: Diagnosis not present

## 2019-12-11 NOTE — Therapy (Signed)
[facility] Physical Therapy [address] [place], [place], [address] Phone: [phone]   Fax:  [phone]  Physical Therapy Treatment  Patient Details  Name: Angela [name] MRN: [id number] Date of Birth: 02/22/1945 Referring Provider (PT): Jean Rosenthal MD   Encounter Date: 12/11/2019   PT End of Session - 12/11/19 0812    Visit Number 5    Number of Visits 13    Date for PT Re-Evaluation 01/11/20    PT Start Time 0805    PT Stop Time 0845    PT Time Calculation (min) 40 min    Activity Tolerance Patient tolerated treatment well    Behavior During Therapy [facility] for tasks assessed/performed           Past Medical History:  Diagnosis Date  . Cancer ([place])    skin  . Chronic pain   . Complication of anesthesia   . 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 Headache   . Hypertension   . Hypothyroidism   . Joint disorder   . Mitral valve prolapse   . Pneumonia   . PONV (postoperative nausea and vomiting)   . 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 Sleep apnea     Past Surgical History:  Procedure Laterality Date  . ABDOMINAL HYSTERECTOMY     Age 75  . ANKLE SURGERY     x3  . APPENDECTOMY    . BIOPSY  10/19/2018   Procedure: BIOPSY;  Surgeon: Ronnette Juniper, MD;  [facility];  Service: Gastroenterology;;  . COLONOSCOPY WITH PROPOFOL N/A 10/19/2018   Procedure: COLONOSCOPY WITH PROPOFOL;  Surgeon: Ronnette Juniper, MD;  [facility];  Service: Gastroenterology;  Laterality: N/A;  . DILATION AND CURETTAGE OF UTERUS     x 4  . ESOPHAGOGASTRODUODENOSCOPY (EGD) WITH PROPOFOL N/A 10/19/2018   Procedure: ESOPHAGOGASTRODUODENOSCOPY (EGD) WITH PROPOFOL;  Surgeon: Ronnette Juniper, MD;  [facility];  Service: Gastroenterology;  Laterality: N/A;  . FRACTURE SURGERY     jaw  . KNEE SURGERY     x 3  .  KYPHOPLASTY N/A 08/07/2019   Procedure: Thoracic twelve Vertebral Augmentation with Stenting/SpineJack;  Surgeon: Vallarie Mare, MD;  Location: [place];  Service: Neurosurgery;  Laterality: N/A;  . MANDIBLE FRACTURE SURGERY     x 19  . POLYPECTOMY  10/19/2018   Procedure: POLYPECTOMY;  Surgeon: Ronnette Juniper, MD;  [facility];  Service: Gastroenterology;;    There were no vitals filed for this visit.   Subjective Assessment - 12/11/19 0809    Subjective Pt arriving to therapy with 2/10 pain. Pt reports she has been working on her HEP.    Pertinent History back surgery 08/07/2019, 19 jaw surgeries, HTN, h/o skin cancer, ankle surgery    Diagnostic tests x-ray    Patient Stated Goals I want the pain to stop    Currently in Pain? Yes    Pain Score 2     Pain Location Back    Pain Orientation Lower;Right    Pain Descriptors / Indicators Sore    Pain Type Chronic pain    Pain Onset More than a month ago    Multiple Pain Sites No                             [facility] Adult PT Treatment/Exercise - 12/11/19  0001      Exercises   Other Exercises  cervical retraction with chin tuck x 10 holding 5 seconds, levator stretch x 5 holding 5 seconds, wall angles x 5       Lumbar Exercises: Stretches   Prone on Elbows Stretch 3 reps;60 seconds   continuous   Press Ups 5 reps;5 seconds      Lumbar Exercises: Supine   Bridge 20 reps;5 seconds    Other Supine Lumbar Exercises lying on 1/2 bolster: opposite arm to knee x 20 reps   more difficult in the beginning, cued abdominal activation   Other Supine Lumbar Exercises modified plank on knees holding 10 seconds x 5 reps      Lumbar Exercises: Quadruped   [place] 10 reps    Opposite Arm/Leg Raise Right arm/Left leg;Left arm/Right leg;15 reps;5 seconds    Opposite Arm/Leg Raise Limitations instructions to keep back in neutral position                  PT Education - 12/11/19 0848    Education Details  added rows and shoulder extension to HEP, issued pt red and green theraband    Person(s) Educated Patient    Methods Explanation;Demonstration    Comprehension Verbalized understanding;Returned demonstration               PT Long Term Goals - 12/04/19 1610      PT LONG TERM GOAL #1   Title Pt will be independent in her HEP and progression.    Status On-going      PT LONG TERM GOAL #2   Title Pt will be able to amb 20 minutes on community level surfaces with no pain.    Status On-going      PT LONG TERM GOAL #3   Title Pt will improve her hip strength to 5/5 in order to improve funciton and gait.    Status On-going      PT LONG TERM GOAL #4   Title Be able to stand in kitchen for cooking with pain </= 2/10.    Status On-going      PT LONG TERM GOAL #5   Title Pt will improve her FOTO score from 71% limitiation to </= 60% limitaiton.    Status On-going                 Plan - 12/11/19 0829    Clinical Impression Statement Pt arriving to therapy reporting mild pain of 2/10 today. Pt progressing with core strength and stability. Continue skilled PT.    Personal Factors and Comorbidities Comorbidity 3+    Comorbidities HTN, kyphoplasty, skin CA, 19 jaw surgeries    Examination-Activity Limitations Transfers;Sit;Stairs;Squat;Dressing;Lift    Examination-Participation Restrictions Community Activity;Other    Stability/Clinical Decision Making Stable/Uncomplicated    PT Frequency 2x / week    PT Duration 6 weeks    PT Treatment/Interventions ADLs/Self Care Home Management;Cryotherapy;Presenter, broadcasting;Therapeutic exercise;Therapeutic activities;Functional mobility training;Stair training;Gait training;Neuromuscular re-education;Patient/family education;Passive range of motion;Manual techniques;Dry needling;Taping    PT Next Visit Plan core strengthening, lumbar stretching, hip strengthening, dynamic balance    PT Home Exercise  Plan [email or web address]    Consulted and Agree with Plan of Care Patient           Patient will benefit from skilled therapeutic intervention in order to improve the following deficits and impairments:  Pain, Decreased strength, Postural dysfunction, Decreased balance, Difficulty walking, Impaired flexibility  Visit Diagnosis: Chronic bilateral low back  pain without sciatica  Muscle weakness (generalized)  Difficulty in walking, not elsewhere classified     Problem List Patient Active Problem List   Diagnosis Date Noted  . Attention deficit hyperactivity disorder, predominantly inattentive type 07/25/2019  . Chronic pain syndrome 07/25/2019  . Irritable bowel syndrome 07/25/2019  . Urethral caruncle 07/25/2019  . Thyroid dysfunction 07/25/2019  . Osteoporosis 07/09/2019  . Hyperprolactinemia ([place]) 07/09/2019  . Trochanteric bursitis, left hip 06/28/2017  . Chronic, continuous use of opioids 10/12/2016  . 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 Other retention of urine 10/12/2016  . Vaginal atrophy 10/12/2016  . 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 New onset headache 01/19/2016  . Excessive sleepiness 01/19/2016  . HYPERTENSION, BENIGN 03/10/2009  . HYPERTENSION, UNCONTROLLED 01/29/2009  . ARM PAIN, LEFT 01/29/2009    Oretha Caprice, PT, MPT 12/11/2019, 8:57 AM  [facility] Physical Therapy [address] [place], [place], [address] Phone: [phone]   Fax:  [phone]  Name: Angela [name] MRN: [id number] Date of Birth: 05-Sep-1945

## 2019-12-11 NOTE — Patient Instructions (Signed)
Access Code: [email or web address] URL: [email or web address] Date: 12/11/2019 Prepared by: Kearney Hard  Exercises Static Prone on Elbows - 2 x daily - 7 x weekly - 3-5 minutes hold Supine Bridge - 2 x daily - 7 x weekly - 2 sets - 10 reps - 5 seconds hold Hooklying Single Knee to Chest Stretch - 2 x daily - 7 x weekly - 5 reps - 10 seconds hold Supine Lower Trunk Rotation - 2 x daily - 7 x weekly - 3 reps - 10 seconds hold Supine Piriformis Stretch with Foot on Ground - 2 x daily - 7 x weekly - 3 reps - 30 seconds hold Supine Cervical Retraction with Towel - 2 x daily - 7 x weekly - 5 reps - 5 seconds hold Gentle Levator Scapulae Stretch - 2 x daily - 7 x weekly - 5 reps - 5 seconds hold Modified Thomas Stretch - 2 x daily - 7 x weekly - 3 reps - 60 seconds hold Standing Shoulder Row with Anchored Resistance - 1 x daily - 7 x weekly - 3 sets - 10 reps Shoulder extension with resistance - Neutral - 1 x daily - 7 x weekly - 3 sets - 10 reps

## 2019-12-14 ENCOUNTER — Ambulatory Visit (INDEPENDENT_AMBULATORY_CARE_PROVIDER_SITE_OTHER): Payer: Medicare Other | Admitting: Physical Therapy

## 2019-12-14 ENCOUNTER — Encounter: Payer: Self-pay | Admitting: Physical Therapy

## 2019-12-14 ENCOUNTER — Other Ambulatory Visit: Payer: Self-pay

## 2019-12-14 DIAGNOSIS — M545 Low back pain, unspecified: Secondary | ICD-10-CM | POA: Diagnosis not present

## 2019-12-14 DIAGNOSIS — M6281 Muscle weakness (generalized): Secondary | ICD-10-CM

## 2019-12-14 DIAGNOSIS — G8929 Other chronic pain: Secondary | ICD-10-CM

## 2019-12-14 DIAGNOSIS — R262 Difficulty in walking, not elsewhere classified: Secondary | ICD-10-CM

## 2019-12-14 NOTE — Therapy (Signed)
[facility] Physical Therapy [address] [place], [place], [address] Phone: [phone]   Fax:  [phone]  Physical Therapy Treatment  Patient Details  Name: Angela [name] MRN: [id number] Date of Birth: 04/08/1945 Referring Provider (PT): Jean Rosenthal MD   Encounter Date: 12/14/2019   PT End of Session - 12/14/19 0925    Visit Number 6    Number of Visits 13    Date for PT Re-Evaluation 01/11/20    PT Start Time 0845    PT Stop Time 0925    PT Time Calculation (min) 40 min    Activity Tolerance Patient tolerated treatment well    Behavior During Therapy [facility] for tasks assessed/performed           Past Medical History:  Diagnosis Date  . Cancer ([place])    skin  . Chronic pain   . Complication of anesthesia   . 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 Headache   . Hypertension   . Hypothyroidism   . Joint disorder   . Mitral valve prolapse   . Pneumonia   . PONV (postoperative nausea and vomiting)   . 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 Sleep apnea     Past Surgical History:  Procedure Laterality Date  . ABDOMINAL HYSTERECTOMY     Age 62  . ANKLE SURGERY     x3  . APPENDECTOMY    . BIOPSY  10/19/2018   Procedure: BIOPSY;  Surgeon: Ronnette Juniper, MD;  [facility]: [facility];  Service: Gastroenterology;;  . COLONOSCOPY WITH PROPOFOL N/A 10/19/2018   Procedure: COLONOSCOPY WITH PROPOFOL;  Surgeon: Ronnette Juniper, MD;  [facility]: [facility];  Service: Gastroenterology;  Laterality: N/A;  . DILATION AND CURETTAGE OF UTERUS     x 4  . ESOPHAGOGASTRODUODENOSCOPY (EGD) WITH PROPOFOL N/A 10/19/2018   Procedure: ESOPHAGOGASTRODUODENOSCOPY (EGD) WITH PROPOFOL;  Surgeon: Ronnette Juniper, MD;  [facility];  Service: Gastroenterology;  Laterality: N/A;  . FRACTURE SURGERY     jaw  . KNEE SURGERY     x 3  .  KYPHOPLASTY N/A 08/07/2019   Procedure: Thoracic twelve Vertebral Augmentation with Stenting/SpineJack;  Surgeon: Vallarie Mare, MD;  Location: [facility];  Service: Neurosurgery;  Laterality: N/A;  . MANDIBLE FRACTURE SURGERY     x 19  . POLYPECTOMY  10/19/2018   Procedure: POLYPECTOMY;  Surgeon: Ronnette Juniper, MD;  [facility];  Service: Gastroenterology;;    There were no vitals filed for this visit.   Subjective Assessment - 12/14/19 0847    Subjective back pain has been a little increased since earlier this week.  has some good days/bad days, and has been working more on her feet    Pertinent History back surgery 08/07/2019, 19 jaw surgeries, HTN, h/o skin cancer, ankle surgery    Diagnostic tests x-ray    Patient Stated Goals I want the pain to stop    Currently in Pain? Yes    Pain Score 8     Pain Location Back    Pain Orientation Right;Lower    Pain Descriptors / Indicators Sore;Aching;Throbbing    Pain Type Chronic pain    Pain Onset More than a month ago    Pain Frequency Constant    Aggravating Factors  walking/standing, difficulty finding comfortable positions    Pain Relieving Factors medication, stretches  [place] Adult PT Treatment/Exercise - 12/14/19 0850      Lumbar Exercises: Stretches   Single Knee to Chest Stretch Right;Left;3 reps;30 seconds    Prone on Elbows Stretch 3 reps;60 seconds   continuous   Piriformis Stretch Right;Left;3 reps;30 seconds      Lumbar Exercises: Aerobic   Nustep L6 x 8 min      Lumbar Exercises: Supine   Bridge 20 reps;5 seconds                       PT Long Term Goals - 12/04/19 6962      PT LONG TERM GOAL #1   Title Pt will be independent in her HEP and progression.    Status On-going      PT LONG TERM GOAL #2   Title Pt will be able to amb 20 minutes on community level surfaces with no pain.    Status On-going      PT LONG TERM GOAL #3   Title Pt will  improve her hip strength to 5/5 in order to improve funciton and gait.    Status On-going      PT LONG TERM GOAL #4   Title Be able to stand in kitchen for cooking with pain </= 2/10.    Status On-going      PT LONG TERM GOAL #5   Title Pt will improve her FOTO score from 71% limitiation to </= 60% limitaiton.    Status On-going                 Plan - 12/14/19 9528    Clinical Impression Statement Pt with improved pain following session and tolerated well despite elevated pain initially.  Will continue to benefit from PT to maximize function.    Personal Factors and Comorbidities Comorbidity 3+    Comorbidities HTN, kyphoplasty, skin CA, 19 jaw surgeries    Examination-Activity Limitations Transfers;Sit;Stairs;Squat;Dressing;Lift    Examination-Participation Restrictions Community Activity;Other    Stability/Clinical Decision Making Stable/Uncomplicated    PT Frequency 2x / week    PT Duration 6 weeks    PT Treatment/Interventions ADLs/Self Care Home Management;Cryotherapy;Presenter, broadcasting;Therapeutic exercise;Therapeutic activities;Functional mobility training;Stair training;Gait training;Neuromuscular re-education;Patient/family education;Passive range of motion;Manual techniques;Dry needling;Taping    PT Next Visit Plan core strengthening, lumbar stretching, hip strengthening, dynamic balance    PT Home Exercise Plan [id number]    Consulted and Agree with Plan of Care Patient           Patient will benefit from skilled therapeutic intervention in order to improve the following deficits and impairments:  Pain, Decreased strength, Postural dysfunction, Decreased balance, Difficulty walking, Impaired flexibility  Visit Diagnosis: Muscle weakness (generalized)  Chronic bilateral low back pain without sciatica  Difficulty in walking, not elsewhere classified     Problem List Patient Active Problem List   Diagnosis Date Noted   . Attention deficit hyperactivity disorder, predominantly inattentive type 07/25/2019  . Chronic pain syndrome 07/25/2019  . Irritable bowel syndrome 07/25/2019  . Urethral caruncle 07/25/2019  . Thyroid dysfunction 07/25/2019  . Osteoporosis 07/09/2019  . Hyperprolactinemia ([place]) 07/09/2019  . Trochanteric bursitis, left hip 06/28/2017  . Chronic, continuous use of opioids 10/12/2016  . 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 Other retention of urine 10/12/2016  . Vaginal atrophy 10/12/2016  . 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 New onset headache 01/19/2016  . Excessive sleepiness 01/19/2016  . HYPERTENSION, BENIGN 03/10/2009  . HYPERTENSION, UNCONTROLLED 01/29/2009  . ARM PAIN,  LEFT 01/29/2009      Laureen Abrahams, PT, DPT 12/14/19 9:27 AM    [facility] Physical Therapy [address] [place], [place], [address] Phone: [phone]   Fax:  [phone]  Name: Angela [name] MRN: [id number] Date of Birth: March 19, 1945

## 2019-12-18 DIAGNOSIS — R339 Retention of urine, unspecified: Secondary | ICD-10-CM | POA: Diagnosis not present

## 2019-12-18 DIAGNOSIS — Z0000 Encounter for general adult medical examination without abnormal findings: Secondary | ICD-10-CM | POA: Diagnosis not present

## 2019-12-18 DIAGNOSIS — R5383 Other fatigue: Secondary | ICD-10-CM | POA: Diagnosis not present

## 2019-12-18 DIAGNOSIS — E039 Hypothyroidism, unspecified: Secondary | ICD-10-CM | POA: Diagnosis not present

## 2019-12-18 DIAGNOSIS — E559 Vitamin D deficiency, unspecified: Secondary | ICD-10-CM | POA: Diagnosis not present

## 2019-12-18 DIAGNOSIS — R7989 Other specified abnormal findings of blood chemistry: Secondary | ICD-10-CM | POA: Diagnosis not present

## 2019-12-18 DIAGNOSIS — I129 Hypertensive chronic kidney disease with stage 1 through stage 4 chronic kidney disease, or unspecified chronic kidney disease: Secondary | ICD-10-CM | POA: Diagnosis not present

## 2019-12-18 DIAGNOSIS — Z13220 Encounter for screening for lipoid disorders: Secondary | ICD-10-CM | POA: Diagnosis not present

## 2019-12-18 DIAGNOSIS — I5032 Chronic diastolic (congestive) heart failure: Secondary | ICD-10-CM | POA: Diagnosis not present

## 2019-12-19 ENCOUNTER — Other Ambulatory Visit: Payer: Self-pay | Admitting: Adult Health

## 2019-12-19 NOTE — Telephone Encounter (Signed)
[place] Drug registry Verified LR:11/08/19 Qty: 85 for 30 days  Last OV:03/28/19 Pending appointment: 03/27/2020

## 2019-12-19 NOTE — Telephone Encounter (Signed)
Pt is needing a refill on her amphetamine-dextroamphetamine (ADDERALL) 10 MG tablet sent in to the [facility] on [address]

## 2019-12-20 MED ORDER — AMPHETAMINE-DEXTROAMPHETAMINE 10 MG PO TABS: 10.0000 mg | ORAL_TABLET | Freq: Every day | ORAL | 0 refills | Status: DC

## 2019-12-25 ENCOUNTER — Encounter: Payer: Medicare Other | Admitting: Physical Therapy

## 2019-12-28 ENCOUNTER — Encounter: Payer: Self-pay | Admitting: Physical Therapy

## 2019-12-28 ENCOUNTER — Ambulatory Visit (INDEPENDENT_AMBULATORY_CARE_PROVIDER_SITE_OTHER): Payer: Medicare Other | Admitting: Physical Therapy

## 2019-12-28 ENCOUNTER — Other Ambulatory Visit: Payer: Self-pay

## 2019-12-28 DIAGNOSIS — R262 Difficulty in walking, not elsewhere classified: Secondary | ICD-10-CM | POA: Diagnosis not present

## 2019-12-28 DIAGNOSIS — M545 Low back pain, unspecified: Secondary | ICD-10-CM | POA: Diagnosis not present

## 2019-12-28 DIAGNOSIS — G8929 Other chronic pain: Secondary | ICD-10-CM

## 2019-12-28 DIAGNOSIS — M6281 Muscle weakness (generalized): Secondary | ICD-10-CM | POA: Diagnosis not present

## 2019-12-28 NOTE — Therapy (Addendum)
[facility] Physical Therapy [address] [place], [place], [address] Phone: [phone]   Fax:  [phone]  Physical Therapy Treatment/Discharge Summary  Patient Details  Name: Angela [name] MRN: [id number] Date of Birth: Jan 02, 1946 Referring Provider (PT): Jean Rosenthal MD   Encounter Date: 12/28/2019   PT End of Session - 12/28/19 0928    Visit Number 7    Number of Visits 13    Date for PT Re-Evaluation 01/11/20    PT Start Time 0849    PT Stop Time 0927    PT Time Calculation (min) 38 min    Activity Tolerance Patient tolerated treatment well    Behavior During Therapy [facility] for tasks assessed/performed           Past Medical History:  Diagnosis Date  . Cancer ([place])    skin  . Chronic pain   . Complication of anesthesia   . 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 Headache   . Hypertension   . Hypothyroidism   . Joint disorder   . Mitral valve prolapse   . Pneumonia   . PONV (postoperative nausea and vomiting)   . 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 Sleep apnea     Past Surgical History:  Procedure Laterality Date  . ABDOMINAL HYSTERECTOMY     Age 14  . ANKLE SURGERY     x3  . APPENDECTOMY    . BIOPSY  10/19/2018   Procedure: BIOPSY;  Surgeon: Ronnette Juniper, MD;  [facility];  Service: Gastroenterology;;  . COLONOSCOPY WITH PROPOFOL N/A 10/19/2018   Procedure: COLONOSCOPY WITH PROPOFOL;  Surgeon: Ronnette Juniper, MD;  [facility];  Service: Gastroenterology;  Laterality: N/A;  . DILATION AND CURETTAGE OF UTERUS     x 4  . ESOPHAGOGASTRODUODENOSCOPY (EGD) WITH PROPOFOL N/A 10/19/2018   Procedure: ESOPHAGOGASTRODUODENOSCOPY (EGD) WITH PROPOFOL;  Surgeon: Ronnette Juniper, MD;  [facility];  Service: Gastroenterology;  Laterality: N/A;  . FRACTURE SURGERY     jaw  . KNEE SURGERY      x 3  . KYPHOPLASTY N/A 08/07/2019   Procedure: Thoracic twelve Vertebral Augmentation with Stenting/SpineJack;  Surgeon: Vallarie Mare, MD;  Location: [place];  Service: Neurosurgery;  Laterality: N/A;  . MANDIBLE FRACTURE SURGERY     x 19  . POLYPECTOMY  10/19/2018   Procedure: POLYPECTOMY;  Surgeon: Ronnette Juniper, MD;  [facility];  Service: Gastroenterology;;    There were no vitals filed for this visit.   Subjective Assessment - 12/28/19 0851    Subjective Rt shoulder is hurting more, sees Hilts next week so plan for another injection - she thinks PT has aggravated her shoulder. back is okay    Pertinent History back surgery 08/07/2019, 19 jaw surgeries, HTN, h/o skin cancer, ankle surgery    Diagnostic tests x-ray    Patient Stated Goals I want the pain to stop    Currently in Pain? Yes    Pain Score 6     Pain Location Back    Pain Orientation Lower;Right    Pain Descriptors / Indicators Aching;Sore;Throbbing    Pain Type Chronic pain    Pain Onset More than a month ago    Pain Frequency Constant    Aggravating Factors  walking/standing, difficulty finding comfortable positions    Pain Relieving Factors meds, stretching  [place] Adult PT Treatment/Exercise - 12/28/19 0854      Lumbar Exercises: Stretches   Single Knee to Chest Stretch Right;Left;3 reps;30 seconds    Piriformis Stretch Right;Left;3 reps;30 seconds      Lumbar Exercises: Aerobic   Nustep L6 x 8 min      Lumbar Exercises: Supine   Bridge 20 reps;5 seconds                       PT Long Term Goals - 12/04/19 1610      PT LONG TERM GOAL #1   Title Pt will be independent in her HEP and progression.    Status On-going      PT LONG TERM GOAL #2   Title Pt will be able to amb 20 minutes on community level surfaces with no pain.    Status On-going      PT LONG TERM GOAL #3   Title Pt will improve her hip strength to 5/5 in order to  improve funciton and gait.    Status On-going      PT LONG TERM GOAL #4   Title Be able to stand in kitchen for cooking with pain </= 2/10.    Status On-going      PT LONG TERM GOAL #5   Title Pt will improve her FOTO score from 71% limitiation to </= 60% limitaiton.    Status On-going                 Plan - 12/28/19 9604    Clinical Impression Statement Pt tolerated session well reporting decreased pain after session.  Will discuss d/c v/s continuation at next visit.    Personal Factors and Comorbidities Comorbidity 3+    Comorbidities HTN, kyphoplasty, skin CA, 19 jaw surgeries    Examination-Activity Limitations Transfers;Sit;Stairs;Squat;Dressing;Lift    Examination-Participation Restrictions Community Activity;Other    Stability/Clinical Decision Making Stable/Uncomplicated    PT Frequency 2x / week    PT Duration 6 weeks    PT Treatment/Interventions ADLs/Self Care Home Management;Cryotherapy;Presenter, broadcasting;Therapeutic exercise;Therapeutic activities;Functional mobility training;Stair training;Gait training;Neuromuscular re-education;Patient/family education;Passive range of motion;Manual techniques;Dry needling;Taping    PT Next Visit Plan core strengthening, lumbar stretching, hip strengthening, dynamic balance, discuss d/c or recert    PT Home Exercise Plan [id number]    Consulted and Agree with Plan of Care Patient           Patient will benefit from skilled therapeutic intervention in order to improve the following deficits and impairments:  Pain,Decreased strength,Postural dysfunction,Decreased balance,Difficulty walking,Impaired flexibility  Visit Diagnosis: Muscle weakness (generalized)  Chronic bilateral low back pain without sciatica  Difficulty in walking, not elsewhere classified     Problem List Patient Active Problem List   Diagnosis Date Noted  . Attention deficit hyperactivity disorder,  predominantly inattentive type 07/25/2019  . Chronic pain syndrome 07/25/2019  . Irritable bowel syndrome 07/25/2019  . Urethral caruncle 07/25/2019  . Thyroid dysfunction 07/25/2019  . Osteoporosis 07/09/2019  . Hyperprolactinemia ([place]) 07/09/2019  . Trochanteric bursitis, left hip 06/28/2017  . Chronic, continuous use of opioids 10/12/2016  . 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 Other retention of urine 10/12/2016  . Vaginal atrophy 10/12/2016  . 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 New onset headache 01/19/2016  . Excessive sleepiness 01/19/2016  . HYPERTENSION, BENIGN 03/10/2009  . HYPERTENSION, UNCONTROLLED 01/29/2009  . ARM PAIN, LEFT 01/29/2009      Laureen Abrahams, PT, DPT 12/28/19 9:30 AM  [facility] Physical Therapy [address] [place], [place], [address] Phone: [phone]   Fax:  [phone]  Name: Angela [name] MRN: [id number] Date of Birth: [birth year]     PHYSICAL THERAPY DISCHARGE SUMMARY  Visits from Start of Care: 7  Current functional level related to goals / functional outcomes: See above   Remaining deficits: See above - referred for surgical consult   Education / Equipment: HEP  Plan: Patient agrees to discharge.  Patient goals were not met. Patient is being discharged due to not returning since the last visit.  ?????    Laureen Abrahams, PT, DPT 02/20/20 2:06 PM  [facility] Physical Therapy [address] [place], [place], [address] Phone: [phone]   Fax:  [phone]

## 2019-12-31 ENCOUNTER — Encounter: Payer: Medicare Other | Admitting: Physical Therapy

## 2019-12-31 ENCOUNTER — Other Ambulatory Visit: Payer: Self-pay

## 2019-12-31 ENCOUNTER — Encounter: Payer: Self-pay | Admitting: Family Medicine

## 2019-12-31 ENCOUNTER — Ambulatory Visit: Payer: Self-pay

## 2019-12-31 ENCOUNTER — Ambulatory Visit (INDEPENDENT_AMBULATORY_CARE_PROVIDER_SITE_OTHER): Payer: Medicare Other | Admitting: Family Medicine

## 2019-12-31 DIAGNOSIS — G8929 Other chronic pain: Secondary | ICD-10-CM | POA: Diagnosis not present

## 2019-12-31 DIAGNOSIS — M25511 Pain in right shoulder: Secondary | ICD-10-CM | POA: Diagnosis not present

## 2019-12-31 NOTE — Progress Notes (Signed)
Office Visit Note   Patient: Angela [name]           Date of Birth: [birth year]           MRN: [id number] Visit Date: 12/31/2019 Requested by: Mcarthur Rossetti, [place],   [id number] PCP: Janie Morning, DO  Subjective: Chief Complaint  Patient presents with  . Right Shoulder - Pain    "Miserable pain" in the shoulder, upper arm -- flared up again 2 weeks. Requests cortisone injection.    HPI: She is here with recurrent right shoulder pain.  Last injection did help for quite a while.  In the past couple weeks she has been miserable in pain.                ROS:   All other systems were reviewed and are negative.  Objective: Vital Signs: There were no vitals taken for this visit.  Physical Exam:  General:  Alert and oriented, in no acute distress. Pulm:  Breathing unlabored. Psy:  Normal mood, congruent affect. Skin: No erythema Right shoulder: She has good range of motion but pain at the extremes.    Imaging: US Guided Needle Placement - No Linked Charges  Result Date: 12/31/2019 Ultrasound guided injection is preferred based studies that show increased duration, increased effect, greater accuracy, decreased procedural pain, increased response rate, and decreased cost with ultrasound guided versus blind injection.   Verbal informed consent obtained.  Time-out conducted.  Noted no overlying erythema, induration, or other signs of local infection. Ultrasound-guided right glenohumeral injection: After sterile prep with Betadine, injected 4 cc 0.25% bupivocaine without epinephrine and 6 mg betamethasone using a 22-gauge spinal needle, passing the needle from posterior approach into the glenohumeral joint.  Injectate seen filling joint capsule.     Assessment & Plan: 1.  Recurrent right shoulder pain -Discussed with her and elected to inject the glenohumeral joint again.  Follow-up as needed.     Procedures: No procedures performed         PMFS History: Patient Active Problem List   Diagnosis Date Noted  . Attention deficit hyperactivity disorder, predominantly inattentive type 07/25/2019  . Chronic pain syndrome 07/25/2019  . Irritable bowel syndrome 07/25/2019  . Urethral caruncle 07/25/2019  . Thyroid dysfunction 07/25/2019  . Osteoporosis 07/09/2019  . Hyperprolactinemia ([place]) 07/09/2019  . Trochanteric bursitis, left hip 06/28/2017  . Chronic, continuous use of opioids 10/12/2016  . 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 Other retention of urine 10/12/2016  . Vaginal atrophy 10/12/2016  . 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 New onset headache 01/19/2016  . Excessive sleepiness 01/19/2016  . HYPERTENSION, BENIGN 03/10/2009  . HYPERTENSION, UNCONTROLLED 01/29/2009  . ARM PAIN, LEFT 01/29/2009   Past Medical History:  Diagnosis Date  . Cancer ([place])    skin  . Chronic pain   . Complication of anesthesia   . 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 Headache   . Hypertension   . Hypothyroidism   . Joint disorder   . Mitral valve prolapse   . Pneumonia   . PONV (postoperative nausea and vomiting)   . 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 Sleep apnea     Family History  Problem Relation Age of Onset  . Breast cancer Mother   . Rheum arthritis Mother   . Aneurysm Father   . Heart disease  Father   . Throat cancer Father   . Melanoma Father   . Heart disease Brother   . Heart disease Brother   . Thyroid disease Brother   . Post-traumatic stress disorder Brother   . Rheum arthritis Brother   . Polymyositis Brother   . Obesity Brother     Past Surgical History:  Procedure Laterality Date  . ABDOMINAL HYSTERECTOMY     Age 83  . ANKLE SURGERY     x3  . APPENDECTOMY    . BIOPSY  10/19/2018   Procedure: BIOPSY;  Surgeon: Ronnette Juniper, MD;  [facility];   Service: Gastroenterology;;  . COLONOSCOPY WITH PROPOFOL N/A 10/19/2018   Procedure: COLONOSCOPY WITH PROPOFOL;  Surgeon: Ronnette Juniper, MD;  [facility];  Service: Gastroenterology;  Laterality: N/A;  . DILATION AND CURETTAGE OF UTERUS     x 4  . ESOPHAGOGASTRODUODENOSCOPY (EGD) WITH PROPOFOL N/A 10/19/2018   Procedure: ESOPHAGOGASTRODUODENOSCOPY (EGD) WITH PROPOFOL;  Surgeon: Ronnette Juniper, MD;  [facility];  Service: Gastroenterology;  Laterality: N/A;  . FRACTURE SURGERY     jaw  . KNEE SURGERY     x 3  . KYPHOPLASTY N/A 08/07/2019   Procedure: Thoracic twelve Vertebral Augmentation with Stenting/SpineJack;  Surgeon: Vallarie Mare, MD;  Location: [place];  Service: Neurosurgery;  Laterality: N/A;  . MANDIBLE FRACTURE SURGERY     x 19  . POLYPECTOMY  10/19/2018   Procedure: POLYPECTOMY;  Surgeon: Ronnette Juniper, MD;  Location: [facility];  Service: Gastroenterology;;   Social History   Occupational History  . Occupation: Part-time Secondary school teacher  Tobacco Use  . Smoking status: Former Research scientist (life sciences)  . Smokeless tobacco: Never Used  . Tobacco comment: Quit January 1980  Vaping Use  . Vaping Use: Never used  Substance and Sexual Activity  . Alcohol use: Yes    Comment: wine 4-5 days per week  . Drug use: No  . Sexual activity: Not on file

## 2020-01-01 ENCOUNTER — Telehealth: Payer: Self-pay | Admitting: Family Medicine

## 2020-01-01 ENCOUNTER — Encounter: Payer: Medicare Other | Admitting: Physical Therapy

## 2020-01-01 MED ORDER — TRAMADOL HCL 50 MG PO TABS: 50.0000 mg | ORAL_TABLET | Freq: Four times a day (QID) | ORAL | 0 refills | Status: DC | PRN

## 2020-01-01 NOTE — Telephone Encounter (Signed)
I called and advised the patient of the Rx being sent in.

## 2020-01-01 NOTE — Telephone Encounter (Signed)
Please advise 

## 2020-01-01 NOTE — Telephone Encounter (Signed)
Pt called stating she had an appt with Dr.Hilts yesterday and he offered her a pain rx and she denied it but the pain hasn't eased up and she would like something sent in. Pt would like a CB to let her know what will be sent in and when it's ready  [phone]

## 2020-01-01 NOTE — Telephone Encounter (Signed)
Tramadol sent. 

## 2020-01-21 ENCOUNTER — Other Ambulatory Visit: Payer: Self-pay | Admitting: Adult Health

## 2020-01-21 MED ORDER — AMPHETAMINE-DEXTROAMPHETAMINE 10 MG PO TABS: 10.0000 mg | ORAL_TABLET | Freq: Every day | ORAL | 0 refills | Status: DC

## 2020-01-21 NOTE — Telephone Encounter (Signed)
Pt. requests refill for amphetamine-dextroamphetamine (ADDERALL) 10 MG tablet.  Pharmacy: [facility] [phone]

## 2020-01-21 NOTE — Addendum Note (Signed)
Addended by: Brandon Melnick on: 01/21/2020 02:29 PM   Modules accepted: Orders

## 2020-01-22 ENCOUNTER — Other Ambulatory Visit: Payer: Self-pay | Admitting: Family Medicine

## 2020-01-22 ENCOUNTER — Ambulatory Visit: Payer: Medicare Other | Admitting: Family Medicine

## 2020-01-22 ENCOUNTER — Encounter: Payer: Self-pay | Admitting: Family Medicine

## 2020-01-22 ENCOUNTER — Other Ambulatory Visit: Payer: Self-pay

## 2020-01-22 DIAGNOSIS — G8929 Other chronic pain: Secondary | ICD-10-CM

## 2020-01-22 DIAGNOSIS — M25511 Pain in right shoulder: Secondary | ICD-10-CM

## 2020-02-10 ENCOUNTER — Ambulatory Visit
Admission: RE | Admit: 2020-02-10 | Discharge: 2020-02-10 | Disposition: A | Discharge status: Home / Self Care | Payer: Medicare Other | Source: Ambulatory Visit | Attending: Family Medicine | Admitting: Family Medicine

## 2020-02-10 DIAGNOSIS — M25511 Pain in right shoulder: Secondary | ICD-10-CM | POA: Diagnosis not present

## 2020-02-10 DIAGNOSIS — G8929 Other chronic pain: Secondary | ICD-10-CM

## 2020-02-11 ENCOUNTER — Telehealth: Payer: Self-pay | Admitting: Family Medicine

## 2020-02-11 NOTE — Telephone Encounter (Signed)
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  Rotator cuff looks ok overall.

## 2020-02-11 NOTE — Telephone Encounter (Signed)
Ok to do one more.

## 2020-02-11 NOTE — Telephone Encounter (Signed)
I called and advised the patient. Appointment scheduled for 10:00 on 02/13/20 with Dr. Junius Roads.

## 2020-02-11 NOTE — Telephone Encounter (Signed)
I called the patient and advised her of her MRI results. She said she is still in a lot of pain and would really like to have one more injection. When that one wears off, she said she would be ready to see Dr. Marlou Sa. Please advise.

## 2020-02-13 ENCOUNTER — Encounter: Payer: Self-pay | Admitting: Family Medicine

## 2020-02-13 ENCOUNTER — Ambulatory Visit (INDEPENDENT_AMBULATORY_CARE_PROVIDER_SITE_OTHER): Payer: Medicare Other | Admitting: Family Medicine

## 2020-02-13 ENCOUNTER — Other Ambulatory Visit: Payer: Self-pay

## 2020-02-13 ENCOUNTER — Ambulatory Visit: Payer: Self-pay

## 2020-02-13 DIAGNOSIS — G8929 Other chronic pain: Secondary | ICD-10-CM

## 2020-02-13 DIAGNOSIS — M25511 Pain in right shoulder: Secondary | ICD-10-CM

## 2020-02-13 NOTE — Progress Notes (Signed)
Office Visit Note   Patient: Angela [name]           Date of Birth: August 05, 1945           MRN: [id number] Visit Date: 02/13/2020 Requested by: Janie Morning, DO [place],  [place] [id number] PCP: Janie Morning, DO  Subjective: Chief Complaint  Patient presents with  . Right Shoulder - Pain, Follow-up    Planned glenohumeral cortisone injection    HPI: 75yo F presenting to clinic for right shoulder CSI. Patient states these have previously worked very well for her, though her last injection was quite painful instead of alleviating, and she's hoping to have better luck with this one. She is otherwise doing well, with no additional concerns.               ROS:   All other systems were reviewed and are negative.  Objective: Vital Signs: There were no vitals taken for this visit.  Physical Exam:  General:  Alert and oriented, in no acute distress. Pulm:  Breathing unlabored. Psy:  Normal mood, congruent affect. Skin:  Right shoulder overlying skin intact. No bruising, no rashes, no erythema.   Right shoulder: Significant pain with abduction, as well as internal/extenal rotation. Crepitus appreciated.   Imaging/Procedure: Ultrasound Guided Right Glenohumeral Cortisone Injection:  Risks and benefits of procedure discussed, Patient opted to proceed. Verbal Consent obtained.  Timeout performed.  Skin prepped in a sterile fashion with betadine before further cleansing with alcohol. Ethyl Chloride was used for topical analgesia.  Right Glenohumeral joint was injected with 6cc 1% Lidocaine without epinephrine under US guidance using a spinal needle. Syringe was removed from the needle, and 40mg  methylprednisolone was then injected into the joint.   Patient tolerated the injection well with no immediate complications. Aftercare instructions were discussed, and patient was given strict return precautions.    Assessment & Plan: 75yo F presenting to clinic for  right CSI injection. Procedure performed as described above, which patient tolerated well. Strict return precautions were discussed.      Procedures: No procedures performed        PMFS History: Patient Active Problem List   Diagnosis Date Noted  . Attention deficit hyperactivity disorder, predominantly inattentive type 07/25/2019  . Chronic pain syndrome 07/25/2019  . Irritable bowel syndrome 07/25/2019  . Urethral caruncle 07/25/2019  . Thyroid dysfunction 07/25/2019  . Osteoporosis 07/09/2019  . Hyperprolactinemia ([place]) 07/09/2019  . Trochanteric bursitis, left hip 06/28/2017  . Chronic, continuous use of opioids 10/12/2016  . 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 Other retention of urine 10/12/2016  . Vaginal atrophy 10/12/2016  . 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 New onset headache 01/19/2016  . Excessive sleepiness 01/19/2016  . HYPERTENSION, BENIGN 03/10/2009  . HYPERTENSION, UNCONTROLLED 01/29/2009  . ARM PAIN, LEFT 01/29/2009   Past Medical History:  Diagnosis Date  . Cancer ([place])    skin  . Chronic pain   . Complication of anesthesia   . 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 Headache   . Hypertension   . Hypothyroidism   . Joint disorder   . Mitral valve prolapse   . Pneumonia   . PONV (postoperative nausea and vomiting)   . Porphyria ([place])    "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)  . Sleep apnea     Family History  Problem Relation Age of Onset  . Breast cancer  Mother   . Rheum arthritis Mother   . Aneurysm Father   . Heart disease Father   . Throat cancer Father   . Melanoma Father   . Heart disease Brother   . Heart disease Brother   . Thyroid disease Brother   . Post-traumatic stress disorder Brother   . Rheum arthritis Brother   . Polymyositis Brother   . Obesity Brother     Past Surgical History:  Procedure Laterality Date   . ABDOMINAL HYSTERECTOMY     Age 38  . ANKLE SURGERY     x3  . APPENDECTOMY    . BIOPSY  10/19/2018   Procedure: BIOPSY;  Surgeon: Ronnette Juniper, MD;  Location: [facility];  Service: Gastroenterology;;  . COLONOSCOPY WITH PROPOFOL N/A 10/19/2018   Procedure: COLONOSCOPY WITH PROPOFOL;  Surgeon: Ronnette Juniper, MD;  Location: [facility];  Service: Gastroenterology;  Laterality: N/A;  . DILATION AND CURETTAGE OF UTERUS     x 4  . ESOPHAGOGASTRODUODENOSCOPY (EGD) WITH PROPOFOL N/A 10/19/2018   Procedure: ESOPHAGOGASTRODUODENOSCOPY (EGD) WITH PROPOFOL;  Surgeon: Ronnette Juniper, MD;  Location: [facility];  Service: Gastroenterology;  Laterality: N/A;  . FRACTURE SURGERY     jaw  . KNEE SURGERY     x 3  . KYPHOPLASTY N/A 08/07/2019   Procedure: Thoracic twelve Vertebral Augmentation with Stenting/SpineJack;  Surgeon: Vallarie Mare, MD;  Location: [facility];  Service: Neurosurgery;  Laterality: N/A;  . MANDIBLE FRACTURE SURGERY     x 19  . POLYPECTOMY  10/19/2018   Procedure: POLYPECTOMY;  Surgeon: Ronnette Juniper, MD;  Location: [facility];  Service: Gastroenterology;;   Social History   Occupational History  . Occupation: Part-time Secondary school teacher  Tobacco Use  . Smoking status: Former Research scientist (life sciences)  . Smokeless tobacco: Never Used  . Tobacco comment: Quit January 1980  Vaping Use  . Vaping Use: Never used  Substance and Sexual Activity  . Alcohol use: Yes    Comment: wine 4-5 days per week  . Drug use: No  . Sexual activity: Not on file

## 2020-02-13 NOTE — Progress Notes (Signed)
I saw and examined the patient with Dr. Elouise Munroe and agree with assessment and plan as outlined.    Shoulder injected with lidocaine and depomedrol.

## 2020-02-18 ENCOUNTER — Other Ambulatory Visit: Payer: Self-pay

## 2020-02-18 MED ORDER — AMPHETAMINE-DEXTROAMPHETAMINE 10 MG PO TABS: 10.0000 mg | ORAL_TABLET | Freq: Every day | ORAL | 0 refills | Status: DC

## 2020-02-18 NOTE — Telephone Encounter (Signed)
Pt is requesting a refill for amphetamine-dextroamphetamine (ADDERALL) 10 MG tablet .  Pharmacy: [id number]  

## 2020-02-18 NOTE — Addendum Note (Signed)
Addended by: Brandon Melnick on: 02/18/2020 03:24 PM   Modules accepted: Orders

## 2020-02-25 ENCOUNTER — Other Ambulatory Visit: Payer: Self-pay | Admitting: Gastroenterology

## 2020-02-27 ENCOUNTER — Ambulatory Visit: Payer: Medicare Other | Admitting: Orthopedic Surgery

## 2020-03-12 ENCOUNTER — Encounter: Payer: Self-pay | Admitting: Orthopedic Surgery

## 2020-03-12 ENCOUNTER — Other Ambulatory Visit: Payer: Self-pay

## 2020-03-12 ENCOUNTER — Ambulatory Visit (INDEPENDENT_AMBULATORY_CARE_PROVIDER_SITE_OTHER): Payer: Medicare Other | Admitting: Orthopedic Surgery

## 2020-03-12 DIAGNOSIS — M19011 Primary osteoarthritis, right shoulder: Secondary | ICD-10-CM | POA: Diagnosis not present

## 2020-03-12 NOTE — Progress Notes (Signed)
Office Visit Note   Patient: Angela [name]           Date of Birth: 1945/11/30           MRN: [id number] Visit Date: 03/12/2020 Requested by: Janie Morning, DO [place] [place],  [place] [id number] PCP: Janie Morning, DO  Subjective: Chief Complaint  Patient presents with  . Right Shoulder - Pain    HPI: Angela [name] is a 75 y.o. female who presents to the office complaining of right shoulder pain.  Patient has 40-month history of right shoulder pain.  Began as "whole arm pain" down her entire right arm.  It has more recently localized to her lateral right shoulder with axillary pain.  She is right-hand dominant.  Denies any history of injury.  She has had multiple injections by Dr. Junius Roads that have provided 100% relief for about 5 to 6 months.  She had an injection in December that did not provide any significant lasting relief but a subsequent injection in early February 2022 has relieved her pain just like it has in the past.  Her pain is typically worse with lifting and with reaching away from her body.  She denies any difficulty lifting her arm but she does note increased fatigue with prolonged lifting of the arm above her head.  She is able to do her hair.  Denies any history of shoulder or neck surgery though she does have multiple jaw surgeries.  She does note occasional numbness and tingling that bothers her about a couple times a week but nothing that is consistent and nothing that she would "describe is a problem".  Denies any neck pain.  She lives at home by herself but frequently spends time with her daughter who lives in [place].  Enjoys hiking.              ROS: All systems reviewed are negative as they relate to the chief complaint within the history of present illness.  Patient denies fevers or chills.  Assessment & Plan: Visit Diagnoses:  1. Primary osteoarthritis, right shoulder     Plan: Patient is a 75 year old female presents complaint of right  shoulder pain.  She has been seen Dr. Junius Roads for a long time and been receiving serial injections that provide about 5 to 6 months of relief.  These injections are ultrasound-guided glenohumeral injections.  Most recent injection has been providing good relief since 02/13/2020.  She has had multiple MRIs with the last MRI on 02/10/2020 that revealed degenerative glenohumeral arthropathy that has worsened since the last MRI study.  She has no full-thickness rotator cuff tear but she does have some tendinopathy of the supraspinatus and subscapularis.  Nothing that is causing any functional deficit and she is managing well with injections.  She wishes to avoid surgery for as long as possible.  This is reasonable given her response to the injections.  Plan to follow-up as needed with Dr. Junius Roads for serial injections and return to see Dr. Marlou Sa when she has reduced response to injections.  May consider cervical spine investigation in the future given the nature of pain that travels down her entire arm at times but she has no significant neck pain at this time and all of her symptoms are improved with the injections so not extremely suspicious currently.  Follow-Up Instructions: No follow-ups on file.   Orders:  No orders of the defined types were placed in this encounter.  No orders of the defined types  were placed in this encounter.     Procedures: No procedures performed   Clinical Data: No additional findings.  Objective: Vital Signs: There were no vitals taken for this visit.  Physical Exam:  Constitutional: Patient appears well-developed HEENT:  Head: Normocephalic Eyes:EOM are normal Neck: Normal range of motion Cardiovascular: Normal rate Pulmonary/chest: Effort normal Neurologic: Patient is alert Skin: Skin is warm Psychiatric: Patient has normal mood and affect  Ortho Exam: Ortho exam demonstrates right shoulder with 70 degrees external rotation, 100 degrees abduction, 150 degrees  forward flexion.  Excellent rotator cuff strength with 5/5 motor strength of subscap, infra, supra.  No significant crepitus with passive motion of the shoulder.  No tenderness throughout the axial cervical spine.  Negative Spurling sign.  5/5 motor strength of bilateral grip strength, finger abduction, pronation/supination, bicep, tricep, deltoid.  Mild tenderness over the bicipital groove of the right shoulder.  Specialty Comments:  No specialty comments available.  Imaging: No results found.   PMFS History: Patient Active Problem List   Diagnosis Date Noted  . Attention deficit hyperactivity disorder, predominantly inattentive type 07/25/2019  . Chronic pain syndrome 07/25/2019  . Irritable bowel syndrome 07/25/2019  . Urethral caruncle 07/25/2019  . Thyroid dysfunction 07/25/2019  . Osteoporosis 07/09/2019  . Hyperprolactinemia ([place]) 07/09/2019  . Trochanteric bursitis, left hip 06/28/2017  . Chronic, continuous use of opioids 10/12/2016  . 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 Other retention of urine 10/12/2016  . Vaginal atrophy 10/12/2016  . 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 New onset headache 01/19/2016  . Excessive sleepiness 01/19/2016  . HYPERTENSION, BENIGN 03/10/2009  . HYPERTENSION, UNCONTROLLED 01/29/2009  . ARM PAIN, LEFT 01/29/2009   Past Medical History:  Diagnosis Date  . Cancer ([place])    skin  . Chronic pain   . Complication of anesthesia   . 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 Headache   . Hypertension   . Hypothyroidism   . Joint disorder   . Mitral valve prolapse   . Pneumonia   . PONV (postoperative nausea and vomiting)   . Porphyria ([place])    "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)  . Sleep apnea     Family History  Problem Relation Age of Onset  . Breast cancer Mother   . Rheum arthritis Mother   . Aneurysm Father   . Heart  disease Father   . Throat cancer Father   . Melanoma Father   . Heart disease Brother   . Heart disease Brother   . Thyroid disease Brother   . Post-traumatic stress disorder Brother   . Rheum arthritis Brother   . Polymyositis Brother   . Obesity Brother     Past Surgical History:  Procedure Laterality Date  . ABDOMINAL HYSTERECTOMY     Age 42  . ANKLE SURGERY     x3  . APPENDECTOMY    . BIOPSY  10/19/2018   Procedure: BIOPSY;  Surgeon: Ronnette Juniper, MD;  [facility];  Service: Gastroenterology;;  . COLONOSCOPY WITH PROPOFOL N/A 10/19/2018   Procedure: COLONOSCOPY WITH PROPOFOL;  Surgeon: Ronnette Juniper, MD;  Location: [facility];  Service: Gastroenterology;  Laterality: N/A;  . DILATION AND CURETTAGE OF UTERUS     x 4  . ESOPHAGOGASTRODUODENOSCOPY (EGD) WITH PROPOFOL N/A 10/19/2018   Procedure: ESOPHAGOGASTRODUODENOSCOPY (EGD) WITH PROPOFOL;  Surgeon: Ronnette Juniper, MD;  [facility];  Service: Gastroenterology;  Laterality: N/A;  .  FRACTURE SURGERY     jaw  . KNEE SURGERY     x 3  . KYPHOPLASTY N/A 08/07/2019   Procedure: Thoracic twelve Vertebral Augmentation with Stenting/SpineJack;  Surgeon: Vallarie Mare, MD;  Location: [facility];  Service: Neurosurgery;  Laterality: N/A;  . MANDIBLE FRACTURE SURGERY     x 19  . POLYPECTOMY  10/19/2018   Procedure: POLYPECTOMY;  Surgeon: Ronnette Juniper, MD;  Location: [facility];  Service: Gastroenterology;;   Social History   Occupational History  . Occupation: Part-time Secondary school teacher  Tobacco Use  . Smoking status: Former Research scientist (life sciences)  . Smokeless tobacco: Never Used  . Tobacco comment: Quit January 1980  Vaping Use  . Vaping Use: Never used  Substance and Sexual Activity  . Alcohol use: Yes    Comment: wine 4-5 days per week  . Drug use: No  . Sexual activity: Not on file

## 2020-03-14 ENCOUNTER — Other Ambulatory Visit: Payer: Self-pay | Admitting: Adult Health

## 2020-03-14 NOTE — Telephone Encounter (Signed)
Pt request refill amphetamine-dextroamphetamine (ADDERALL) 10 MG tablet at [facility] #[id number]

## 2020-03-17 MED ORDER — AMPHETAMINE-DEXTROAMPHETAMINE 10 MG PO TABS: 10.0000 mg | ORAL_TABLET | Freq: Every day | ORAL | 0 refills | Status: DC

## 2020-03-27 ENCOUNTER — Ambulatory Visit: Payer: Medicare Other | Admitting: Adult Health

## 2020-04-07 ENCOUNTER — Encounter (HOSPITAL_COMMUNITY): Payer: Self-pay | Admitting: Gastroenterology

## 2020-04-07 ENCOUNTER — Other Ambulatory Visit: Payer: Self-pay

## 2020-04-07 DIAGNOSIS — R102 Pelvic and perineal pain: Secondary | ICD-10-CM | POA: Diagnosis not present

## 2020-04-07 DIAGNOSIS — N952 Postmenopausal atrophic vaginitis: Secondary | ICD-10-CM | POA: Diagnosis not present

## 2020-04-07 DIAGNOSIS — N959 Unspecified menopausal and perimenopausal disorder: Secondary | ICD-10-CM | POA: Diagnosis not present

## 2020-04-07 DIAGNOSIS — N760 Acute vaginitis: Secondary | ICD-10-CM | POA: Diagnosis not present

## 2020-04-08 ENCOUNTER — Other Ambulatory Visit (HOSPITAL_COMMUNITY)
Admission: RE | Admit: 2020-04-08 | Discharge: 2020-04-08 | Disposition: A | Discharge status: Home / Self Care | Payer: Medicare Other | Source: Ambulatory Visit | Attending: Gastroenterology | Admitting: Gastroenterology

## 2020-04-08 DIAGNOSIS — Z20822 Contact with and (suspected) exposure to covid-19: Secondary | ICD-10-CM | POA: Insufficient documentation

## 2020-04-08 DIAGNOSIS — Z01812 Encounter for preprocedural laboratory examination: Secondary | ICD-10-CM | POA: Insufficient documentation

## 2020-04-08 LAB — SARS CORONAVIRUS 2 (TAT 6-24 HRS): SARS Coronavirus 2: NEGATIVE

## 2020-04-10 NOTE — H&P (Signed)
History of present illness: 75 year old female with 10 tubular adenomas removed in 10/2018.   ROS:      GI PROCEDURE:          Pacemaker/ AICD no.  MI/heart attack no.  Angina no.  Artificial heart valves no.  Abnormal heart rhythm no.  CVA no.  Hypertension YES.  Hypotension no.  Asthma, COPD no.  Sleep apnea YES, does not use CPAP.  Seizure disorders no.  Artificial joints no.  Severe DJD no.  Diabetes no.  Significant headaches no.  Vertigo no.  Depression/anxiety no.  Abnormal bleeding no.               Medical History:  Mitral valve prolapse, Hypertension, constipation predominant IBS, Chronic pain 2/2 19 jaw surgeries - follows c/ Dr. Lynnda Shields at [facility], Skin cancer , Colon Therisa [facility] 11/2018 10 tub adenomas, repeat colon 1 year .       Surgical History: jaw surgery x 19 , L knee surgery x 2 , hysterectomy - still has ovaries , three D&Cs following miscarriages x 3 , ankle surgery x 3 06/2010, skin cancer excision 12/2017, Colonoscopy 11/2018, 10/2019, Kyphoplasty 07/2019.       Hospitalization/Major Diagnostic Procedure: Ankle surgery 06/2010, not in past year 03/2020.       Family History:  Father: deceased 59 yrs, throat cancer, AAA, CAD, diagnosed with COPD (chronic obstructive pulmonary disease).  Mother: deceased 30 yrs, Rheumatoid arthritis, diagnosed with Breast cancer.  Brother 1: alive, heart problems, thyroid disease (uncertain which disease), PTSD, polio as a child, rheumatoid arthritis.  Brother2: deceased 59 yrs, MI.  Paternal uncle: deceased, diagnosed with Colon cancer.  Daughter(s): alive 23 yrs, thyroid cancer (unsure which type), mastectomy - 18 tumors, Psoriatic arthritis.   No Family History of Polyps, or Liver Disease Paternal uncle with colon cancer.      Social History:      General:          Tobacco use              cigarettes:  Never smoked            Tobacco history last updated  04/17/2019         no EXPOSURE TO PASSIVE SMOKE.          Alcohol: yes,  Daily, wine.          Caffeine: yes, coffee, 2 servings daily, tea, occasionally.          no Recreational drug use.          no Exercise.          Marital Status: single, Divorced.          Children: girls, 1.          EDUCATION: Some College.          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          COMMUNICATION BARRIERS: none.       Medications: Taking Ondansetron HCl 8 MG Tablet 1 tablet as needed Orally Once a day, Taking Furosemide 20 MG Tablet 1 tablet as needed Orally Once a day, Taking Buprenorphine HCl 8 MG Tablet Sublingual 1/2 tablet Sublingual Three times a day, Taking Armour Thyroid(Thyroid) 60 MG Tablet 1 tablet on an empty stomach Orally Once a day, Taking Valsartan-hydroCHLOROthiazide 160-25 MG Tablet 1 tablet Orally Once a day, Taking Adderall(Amphetamine-Dextroamphetamine) 10 MG Tablet 1 tablet Orally Twice a day, Not-Taking Amphetamine-Dextroamphetamine 10 MG Tablet (  Schedule II Drug) TK 1 TO 2 TS PO D WITH BRE Oral , Not-Taking Buprenorphine HCl-Naloxone HCl 8-2 MG Tablet Sublingual 1 tablet under the tongue and allow to dissolve Sublingual as needed, Not-Taking Lisinopril 10 MG Tablet 1 tablet Orally Once a day, Not-Taking Potassium Citrate ER 5 MEQ (540 MG) Tablet Extended Release 1 tablet with meals Orally once a day, Not-Taking Subutex(Buprenorphine HCl) , Not-Taking Vitamin D3 Maximum Strength(Cholecalciferol) 5000 UNIT Capsule 1 capsule Orally Once a day, Not-Taking Synthroid(L-Thyroxine Sodium) 100 MCG Tablet 1 tablet in the morning on an empty stomach Orally Once a day      Allergies: doxycycline: blisters on feet, NSAIDs: rash/blisters, Aspirin: Blisters.      Objective:      Vitals: Wt 112, Ht 65, BMI 18.64.  Assessment and plan  Multiple tubular adenomas removed in 10/2018 Surveillance colonoscopy

## 2020-04-10 NOTE — Anesthesia Preprocedure Evaluation (Addendum)
Anesthesia Evaluation  Patient identified by MRN, date of birth, ID band Patient awake    Reviewed: Allergy & Precautions, Patient's Chart, lab work & pertinent test results  History of Anesthesia Complications (+) PONV, DIFFICULT AIRWAY and history of anesthetic complications  Airway Mallampati: IV   Neck ROM: Full  Mouth opening: Limited Mouth Opening  Dental no notable dental hx. (+) Teeth Intact, Dental Advisory Given   Pulmonary sleep apnea , COPD, former smoker,    Pulmonary exam normal breath sounds clear to auscultation       Cardiovascular hypertension, Pt. on medications Normal cardiovascular exam Rhythm:Regular Rate:Normal     Neuro/Psych    GI/Hepatic Neg liver ROS, Hx of esophageal CA   Endo/Other  Hypothyroidism   Renal/GU      Musculoskeletal negative musculoskeletal ROS (+)   Abdominal   Peds  Hematology Lab Results      Component                Value               Date                      WBC                      16.6 (H)            10/07/2019                HGB                      13.7                10/07/2019                HCT                      42.3                10/07/2019                MCV                      100.7 (H)           10/07/2019                PLT                      312                 10/07/2019              Anesthesia Other Findings   Reproductive/Obstetrics                            Anesthesia Physical Anesthesia Plan  ASA: III  Anesthesia Plan: MAC   Post-op Pain Management:    Induction:   PONV Risk Score and Plan: Treatment may vary due to age or medical condition  Airway Management Planned: Natural Airway  Additional Equipment: None  Intra-op Plan:   Post-operative Plan:   Informed Consent: I have reviewed the patients History and Physical, chart, labs and discussed the procedure including the risks, benefits and  alternatives for the proposed anesthesia with the patient or authorized representative who has indicated his/her understanding and acceptance.  Dental advisory given  Plan Discussed with: CRNA and Anesthesiologist  Anesthesia Plan Comments: (Colonoscopy for history of colon polyps)       Anesthesia Quick Evaluation

## 2020-04-11 ENCOUNTER — Ambulatory Visit (HOSPITAL_COMMUNITY): Payer: Medicare Other | Admitting: Anesthesiology

## 2020-04-11 ENCOUNTER — Encounter (HOSPITAL_COMMUNITY): Payer: Self-pay | Admitting: Gastroenterology

## 2020-04-11 ENCOUNTER — Ambulatory Visit (HOSPITAL_COMMUNITY)
Admission: RE | Admit: 2020-04-11 | Discharge: 2020-04-11 | Disposition: A | Discharge status: Home / Self Care | Payer: Medicare Other | Attending: Gastroenterology | Admitting: Gastroenterology

## 2020-04-11 ENCOUNTER — Encounter (HOSPITAL_COMMUNITY)
Admission: RE | Disposition: A | Discharge status: Home / Self Care | Payer: Self-pay | Source: Home / Self Care | Attending: Gastroenterology

## 2020-04-11 ENCOUNTER — Other Ambulatory Visit: Payer: Self-pay

## 2020-04-11 DIAGNOSIS — D123 Benign neoplasm of transverse colon: Secondary | ICD-10-CM | POA: Diagnosis not present

## 2020-04-11 DIAGNOSIS — Z8249 Family history of ischemic heart disease and other diseases of the circulatory system: Secondary | ICD-10-CM | POA: Insufficient documentation

## 2020-04-11 DIAGNOSIS — K648 Other hemorrhoids: Secondary | ICD-10-CM | POA: Insufficient documentation

## 2020-04-11 DIAGNOSIS — Z881 Allergy status to other antibiotic agents status: Secondary | ICD-10-CM | POA: Diagnosis not present

## 2020-04-11 DIAGNOSIS — Z85828 Personal history of other malignant neoplasm of skin: Secondary | ICD-10-CM | POA: Diagnosis not present

## 2020-04-11 DIAGNOSIS — Z886 Allergy status to analgesic agent status: Secondary | ICD-10-CM | POA: Diagnosis not present

## 2020-04-11 DIAGNOSIS — Z8759 Personal history of other complications of pregnancy, childbirth and the puerperium: Secondary | ICD-10-CM | POA: Diagnosis not present

## 2020-04-11 DIAGNOSIS — I341 Nonrheumatic mitral (valve) prolapse: Secondary | ICD-10-CM | POA: Insufficient documentation

## 2020-04-11 DIAGNOSIS — I10 Essential (primary) hypertension: Secondary | ICD-10-CM | POA: Diagnosis not present

## 2020-04-11 DIAGNOSIS — K621 Rectal polyp: Secondary | ICD-10-CM | POA: Diagnosis not present

## 2020-04-11 DIAGNOSIS — Z90710 Acquired absence of both cervix and uterus: Secondary | ICD-10-CM | POA: Diagnosis not present

## 2020-04-11 DIAGNOSIS — K635 Polyp of colon: Secondary | ICD-10-CM | POA: Diagnosis not present

## 2020-04-11 DIAGNOSIS — Z09 Encounter for follow-up examination after completed treatment for conditions other than malignant neoplasm: Secondary | ICD-10-CM | POA: Insufficient documentation

## 2020-04-11 DIAGNOSIS — J449 Chronic obstructive pulmonary disease, unspecified: Secondary | ICD-10-CM | POA: Diagnosis not present

## 2020-04-11 DIAGNOSIS — K589 Irritable bowel syndrome without diarrhea: Secondary | ICD-10-CM | POA: Diagnosis not present

## 2020-04-11 DIAGNOSIS — Z86010 Personal history of colonic polyps: Secondary | ICD-10-CM | POA: Diagnosis not present

## 2020-04-11 HISTORY — PX: POLYPECTOMY: SHX5525

## 2020-04-11 HISTORY — PX: BIOPSY: SHX5522

## 2020-04-11 HISTORY — PX: COLONOSCOPY WITH PROPOFOL: SHX5780

## 2020-04-11 SURGERY — COLONOSCOPY WITH PROPOFOL
Anesthesia: Monitor Anesthesia Care

## 2020-04-11 MED ORDER — PROPOFOL 10 MG/ML IV BOLUS
INTRAVENOUS | Status: DC | PRN
  Administered 2020-04-11: 20 mg via INTRAVENOUS
  Administered 2020-04-11 (×3): 10 mg via INTRAVENOUS
  Administered 2020-04-11: 20 mg via INTRAVENOUS
  Administered 2020-04-11 (×2): 10 mg via INTRAVENOUS

## 2020-04-11 MED ORDER — SODIUM CHLORIDE 0.9 % IV SOLN: INTRAVENOUS | Status: DC

## 2020-04-11 MED ORDER — LACTATED RINGERS IV SOLN
INTRAVENOUS | Status: DC
  Administered 2020-04-11: 1000 mL via INTRAVENOUS

## 2020-04-11 MED ORDER — PROPOFOL 500 MG/50ML IV EMUL
INTRAVENOUS | Status: DC | PRN
  Administered 2020-04-11: 125 ug/kg/min via INTRAVENOUS

## 2020-04-11 MED ORDER — PROPOFOL 500 MG/50ML IV EMUL
INTRAVENOUS | Status: AC
  Filled 2020-04-11: qty 50

## 2020-04-11 MED ORDER — PROPOFOL 10 MG/ML IV BOLUS
INTRAVENOUS | Status: AC
  Filled 2020-04-11: qty 20

## 2020-04-11 SURGICAL SUPPLY — 21 items

## 2020-04-11 NOTE — Transfer of Care (Signed)
Immediate Anesthesia Transfer of Care Note  Patient: Angela [name]  Procedure(s) Performed: COLONOSCOPY WITH PROPOFOL (N/A ) BIOPSY POLYPECTOMY  Patient Location: PACU  Anesthesia Type:MAC  Level of Consciousness: awake  Airway & Oxygen Therapy: Patient Spontanous Breathing and Patient connected to face mask oxygen  Post-op Assessment: Report given to RN, Post -op Vital signs reviewed and stable and Patient moving all extremities X 4  Post vital signs: Reviewed and stable  Last Vitals:  Vitals Value Taken Time  BP 113/65 04/11/20 0950  Temp    Pulse 71 04/11/20 0950  Resp 11 04/11/20 0950  SpO2 100 % 04/11/20 0950  Vitals shown include unvalidated device data.  Last Pain:  Vitals:   04/11/20 0750  TempSrc: Oral  PainSc: 0-No pain         Complications: No complications documented.

## 2020-04-11 NOTE — Brief Op Note (Signed)
04/11/2020  9:53 AM  PATIENT:  Angela [name]  75 y.o. female  PRE-OPERATIVE DIAGNOSIS:  Hx of polyps  POST-OPERATIVE DIAGNOSIS:  polyps transverse x 3, hepatic flex., sigmoid (hot), rectal   PROCEDURE:  Procedure(s) with comments: COLONOSCOPY WITH PROPOFOL (N/A) - (difficulty airway)  BIOPSY POLYPECTOMY  SURGEON:  Surgeon(s) and Role:    Ronnette Juniper, MD - Primary  PHYSICIAN ASSISTANT:   ASSISTANTS: Alphonse Guild  ANESTHESIA:   MAC  EBL:  Minimal  BLOOD ADMINISTERED:none  DRAINS: none   LOCAL MEDICATIONS USED:  NONE  SPECIMEN:  Biopsy / Limited Resection  DISPOSITION OF SPECIMEN:  PATHOLOGY  COUNTS:  YES  TOURNIQUET:  * No tourniquets in log *  DICTATION: .Dragon Dictation  PLAN OF CARE: Discharge to home after PACU  PATIENT DISPOSITION:  PACU - hemodynamically stable.   Delay start of Pharmacological VTE agent (>24hrs) due to surgical blood loss or risk of bleeding: not applicable

## 2020-04-11 NOTE — Anesthesia Procedure Notes (Signed)
Procedure Name: MAC Date/Time: 04/11/2020 9:05 AM Performed by: Niel Hummer, CRNA Pre-anesthesia Checklist: Patient identified, Emergency Drugs available, Suction available and Patient being monitored Oxygen Delivery Method: Simple face mask

## 2020-04-11 NOTE — Op Note (Signed)
[facility] Patient Name: Angela [name] Procedure Date: 04/11/2020 MRN: [id number] Attending MD: Ronnette Juniper , MD Date of Birth: 12-25-1945 CSN: [id number] Age: 75 Admit Type: Outpatient Procedure:                Colonoscopy Indications:              Surveillance: History of numerous (10) adenomas on                            last colonoscopy (< 3 yrs), Last colonoscopy:                            October 2020 Providers:                Ronnette Juniper, MD, Grace Isaac, RN, Tyrone Apple,                            Technician, Maudry Diego, CRNA Referring MD:             Corinna Gab Medicines:                Monitored Anesthesia Care Complications:            No immediate complications. Estimated blood loss:                            Minimal. Estimated Blood Loss:     Estimated blood loss was minimal. Procedure:                Pre-Anesthesia Assessment:                           - Prior to the procedure, a History and Physical                            was performed, and patient medications and                            allergies were reviewed. The patient's tolerance of                            previous anesthesia was also reviewed. The risks                            and benefits of the procedure and the sedation                            options and risks were discussed with the patient.                            All questions were answered, and informed consent                            was obtained. Prior Anticoagulants: The patient has                            taken no  previous anticoagulant or antiplatelet                            agents. ASA Grade Assessment: III - A patient with                            severe systemic disease. After reviewing the risks                            and benefits, the patient was deemed in                            satisfactory condition to undergo the procedure.                           After obtaining  informed consent, the colonoscope                            was passed under direct vision. Throughout the                            procedure, the patient's blood pressure, pulse, and                            oxygen saturations were monitored continuously. The                            PCF-H190DL ([id number]) Olympus pediatric colonscope                            was introduced through the anus and advanced to the                            the colocolonic anastomosis. The colonoscopy was                            performed without difficulty. The patient tolerated                            the procedure well. The quality of the bowel                            preparation was adequate to identify polyps 6 mm                            and larger in size. Scope In: 9:11:44 AM Scope Out: 9:44:14 AM Scope Withdrawal Time: 0 hours 21 minutes 28 seconds  Total Procedure Duration: 0 hours 32 minutes 30 seconds  Findings:      The perianal and digital rectal examinations were normal.      Three sessile polyps were found in the transverse colon. The polyps were       4 to 5 mm in size. These polyps were removed with a cold biopsy forceps.       Resection and retrieval were  complete.      A 7 mm polyp was found in the hepatic flexure. The polyp was sessile.       The polyp was removed with a piecemeal technique using a cold biopsy       forceps. Resection and retrieval were complete.      A 7 mm polyp was found in the sigmoid colon. The polyp was sessile. The       polyp was removed with a hot snare. Resection and retrieval were       complete.      A 4 mm polyp was found in the rectum. The polyp was sessile. The polyp       was removed with a cold biopsy forceps. Resection and retrieval were       complete.      Non-bleeding internal hemorrhoids were found during retroflexion. The       hemorrhoids were medium-sized. Impression:               - Three 4 to 5 mm polyps in the transverse  colon,                            removed with a cold biopsy forceps. Resected and                            retrieved.                           - One 7 mm polyp at the hepatic flexure, removed                            piecemeal using a cold biopsy forceps. Resected and                            retrieved.                           - One 7 mm polyp in the sigmoid colon, removed with                            a hot snare. Resected and retrieved.                           - One 4 mm polyp in the rectum, removed with a cold                            biopsy forceps. Resected and retrieved.                           - Non-bleeding internal hemorrhoids. Moderate Sedation:      Patient did not receive moderate sedation for this procedure, but       instead received monitored anesthesia care. Recommendation:           - Patient has a contact number available for                            emergencies. The signs and symptoms of potential  delayed complications were discussed with the                            patient. Return to normal activities tomorrow.                            Written discharge instructions were provided to the                            patient.                           - Resume regular diet.                           - Continue present medications.                           - Await pathology results.                           - Repeat colonoscopy for surveillance based on                            pathology results. Procedure Code(s):        --- Professional ---                           [phone], Colonoscopy, flexible; with removal of                            tumor(s), polyp(s), or other lesion(s) by snare                            technique                           45380, 20, Colonoscopy, flexible; with biopsy,                            single or multiple Diagnosis Code(s):        --- Professional ---                            Z86.010, Personal history of colonic polyps                           K63.5, Polyp of colon                           K62.1, Rectal polyp                           K64.8, Other hemorrhoids CPT copyright 2019 American Medical Association. All rights reserved. The codes documented in this report are preliminary and upon coder review may  be revised to meet current compliance requirements. Ronnette Juniper, MD 04/11/2020 9:53:31 AM This report has been signed electronically. Number of Addenda: 0

## 2020-04-11 NOTE — Anesthesia Postprocedure Evaluation (Signed)
Anesthesia Post Note  Patient: Angela [name]  Procedure(s) Performed: COLONOSCOPY WITH PROPOFOL (N/A ) BIOPSY POLYPECTOMY     Patient location during evaluation: Endoscopy Anesthesia Type: MAC Level of consciousness: awake and alert Pain management: pain level controlled Vital Signs Assessment: post-procedure vital signs reviewed and stable Respiratory status: spontaneous breathing, nonlabored ventilation, respiratory function stable and patient connected to nasal cannula oxygen Cardiovascular status: blood pressure returned to baseline and stable Postop Assessment: no apparent nausea or vomiting Anesthetic complications: no   No complications documented.  Last Vitals:  Vitals:   04/11/20 1010 04/11/20 1020  BP: 136/72 (!) 143/87  Pulse: 73 78  Resp: 20 20  Temp:    SpO2: 100% 98%    Last Pain:  Vitals:   04/11/20 1020  TempSrc:   PainSc: 0-No pain                 Barnet Glasgow

## 2020-04-11 NOTE — Interval H&P Note (Signed)
History and Physical Interval Note: 75/female with multiple Tubular adenomas removed, for a surveillance colonoscopy.  04/11/2020 8:05 AM  Angela [name]  has presented today for colonoscopy, with the diagnosis of Hx of polyps.  The various methods of treatment have been discussed with the patient and family. After consideration of risks, benefits and other options for treatment, the patient has consented to  Procedure(s) with comments: COLONOSCOPY WITH PROPOFOL (N/A) - (difficulty airway)  as a surgical intervention.  The patient's history has been reviewed, patient examined, no change in status, stable for surgery.  I have reviewed the patient's chart and labs.  Questions were answered to the patient's satisfaction.     Ronnette Juniper

## 2020-04-11 NOTE — Discharge Instructions (Signed)

## 2020-04-13 ENCOUNTER — Encounter (HOSPITAL_COMMUNITY): Payer: Self-pay | Admitting: Gastroenterology

## 2020-04-14 ENCOUNTER — Other Ambulatory Visit: Payer: Self-pay | Admitting: Adult Health

## 2020-04-14 LAB — SURGICAL PATHOLOGY

## 2020-04-14 MED ORDER — AMPHETAMINE-DEXTROAMPHETAMINE 10 MG PO TABS: 10.0000 mg | ORAL_TABLET | Freq: Every day | ORAL | 0 refills | Status: DC

## 2020-04-14 NOTE — Addendum Note (Signed)
Addended by: Lester Eden A on: 04/14/2020 02:00 PM   Modules accepted: Orders

## 2020-04-14 NOTE — Progress Notes (Signed)
Telephone call to patient to follow up from colonoscopy on last Fridayl  Patient states was having "a lot of pain Friday night, cramping, making it difficult to stand".  Today her pain is improving but is still a 4-5 out of 10.  She denies any blood or black stools.  She states she has had a small amount of liquid brown stool today.  She states this is the first time she has had pain after a colonoscopy "like this but thought it was just gas from the procedure".  Instructed patient to contact Dr. Encarnacion Slates office.  I contacted Dr. Encarnacion Slates office and left a message for her nurse to contact patient regarding above symptoms.

## 2020-04-14 NOTE — Telephone Encounter (Signed)
I have routed this request to Ward Givens, NP for review. The pt is due for the medication and [place] registry was verified. Last filled 03/17/20 and pt has upcoming apt 04/28/20.

## 2020-04-14 NOTE — Telephone Encounter (Signed)
Pt is requesting a refill for amphetamine-dextroamphetamine (ADDERALL) 10 MG tablet .  Pharmacy: [id number]  

## 2020-04-14 NOTE — Addendum Note (Signed)
Addended by: Darleen Crocker on: 04/14/2020 02:01 PM   Modules accepted: Orders

## 2020-04-14 NOTE — Telephone Encounter (Signed)
Patient is due for an appointment.  She has scheduled an appointment for April 28, 2020.  [place] Controlled Substance Registry has been checked and is appropriate.  Patient last filled Adderall on 03/17/2020, #60.

## 2020-04-15 ENCOUNTER — Ambulatory Visit
Admission: RE | Admit: 2020-04-15 | Discharge: 2020-04-15 | Disposition: A | Discharge status: Home / Self Care | Payer: Medicare Other | Source: Ambulatory Visit | Attending: Gastroenterology | Admitting: Gastroenterology

## 2020-04-15 ENCOUNTER — Other Ambulatory Visit: Payer: Self-pay

## 2020-04-15 ENCOUNTER — Other Ambulatory Visit: Payer: Self-pay | Admitting: Gastroenterology

## 2020-04-15 DIAGNOSIS — R109 Unspecified abdominal pain: Secondary | ICD-10-CM | POA: Diagnosis not present

## 2020-04-15 DIAGNOSIS — R52 Pain, unspecified: Secondary | ICD-10-CM

## 2020-04-21 NOTE — H&P (Signed)
Physical exam: Thinly built, not in distress Lungs :equal expansion, equal air entry Heart: Regular rate rhythm Abdomen:Soft, nondistended, nontender Neuro: Alert, awake, oriented x3, no focal neurological deficits

## 2020-04-28 ENCOUNTER — Encounter: Payer: Self-pay | Admitting: Adult Health

## 2020-04-28 ENCOUNTER — Ambulatory Visit (INDEPENDENT_AMBULATORY_CARE_PROVIDER_SITE_OTHER): Payer: Medicare Other | Admitting: Adult Health

## 2020-04-28 VITALS — BP 116/74 | HR 83 | Ht 66.0 in | Wt 120.0 lb

## 2020-04-28 DIAGNOSIS — G4733 Obstructive sleep apnea (adult) (pediatric): Secondary | ICD-10-CM

## 2020-04-28 DIAGNOSIS — Z9989 Dependence on other enabling machines and devices: Secondary | ICD-10-CM | POA: Diagnosis not present
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
## 2020-04-28 NOTE — Patient Instructions (Signed)
Your Plan:  Consider Inspire device Continue Adderall 10-20 mg daily  If your symptoms worsen or you develop new symptoms please let us know.       Thank you for coming to see [name] at [facility]. I hope we have been able to provide you high quality care today.  You may receive a patient satisfaction survey over the next few weeks. We would appreciate your feedback and comments so that we may continue to improve ourselves and the health of our patients.

## 2020-05-05 DIAGNOSIS — N760 Acute vaginitis: Secondary | ICD-10-CM | POA: Diagnosis not present

## 2020-05-05 DIAGNOSIS — N950 Postmenopausal bleeding: Secondary | ICD-10-CM | POA: Diagnosis not present

## 2020-05-05 DIAGNOSIS — N898 Other specified noninflammatory disorders of vagina: Secondary | ICD-10-CM | POA: Diagnosis not present

## 2020-05-09 DIAGNOSIS — S22081D Stable burst fracture of T11-T12 vertebra, subsequent encounter for fracture with routine healing: Secondary | ICD-10-CM | POA: Diagnosis not present

## 2020-05-19 ENCOUNTER — Other Ambulatory Visit: Payer: Self-pay | Admitting: Adult Health

## 2020-05-19 ENCOUNTER — Telehealth: Payer: Self-pay | Admitting: Adult Health

## 2020-05-19 DIAGNOSIS — G4710 Hypersomnia, unspecified: Secondary | ICD-10-CM

## 2020-05-19 DIAGNOSIS — G4733 Obstructive sleep apnea (adult) (pediatric): Secondary | ICD-10-CM

## 2020-05-19 MED ORDER — AMPHETAMINE-DEXTROAMPHETAMINE 10 MG PO TABS: 10.0000 mg | ORAL_TABLET | Freq: Every day | ORAL | 0 refills | Status: DC

## 2020-05-19 NOTE — Telephone Encounter (Signed)
Pt request refill amphetamine-dextroamphetamine (ADDERALL) 10 MG tablet at [id number] 

## 2020-05-19 NOTE — Telephone Encounter (Signed)
Pt called, Dr. Clabe Seal was to ched[place] to see if I need another sleep study for Inspirer for sleep apnea. Would like a call from the nurse.

## 2020-05-23 DIAGNOSIS — E2740 Unspecified adrenocortical insufficiency: Secondary | ICD-10-CM | POA: Diagnosis not present

## 2020-05-23 DIAGNOSIS — E063 Autoimmune thyroiditis: Secondary | ICD-10-CM | POA: Diagnosis not present

## 2020-05-23 DIAGNOSIS — E233 Hypothalamic dysfunction, not elsewhere classified: Secondary | ICD-10-CM | POA: Insufficient documentation

## 2020-05-23 DIAGNOSIS — M810 Age-related osteoporosis without current pathological fracture: Secondary | ICD-10-CM | POA: Diagnosis not present

## 2020-05-23 DIAGNOSIS — R519 Headache, unspecified: Secondary | ICD-10-CM | POA: Diagnosis not present

## 2020-05-23 DIAGNOSIS — E038 Other specified hypothyroidism: Secondary | ICD-10-CM | POA: Diagnosis not present

## 2020-05-26 ENCOUNTER — Other Ambulatory Visit: Payer: Self-pay

## 2020-05-26 ENCOUNTER — Ambulatory Visit (INDEPENDENT_AMBULATORY_CARE_PROVIDER_SITE_OTHER): Payer: Medicare Other | Admitting: Family Medicine

## 2020-05-26 ENCOUNTER — Encounter: Payer: Self-pay | Admitting: Family Medicine

## 2020-05-26 ENCOUNTER — Ambulatory Visit: Payer: Self-pay

## 2020-05-26 DIAGNOSIS — G8929 Other chronic pain: Secondary | ICD-10-CM

## 2020-05-26 DIAGNOSIS — G4733 Obstructive sleep apnea (adult) (pediatric): Secondary | ICD-10-CM

## 2020-05-26 DIAGNOSIS — Z9989 Dependence on other enabling machines and devices: Secondary | ICD-10-CM | POA: Insufficient documentation

## 2020-05-26 DIAGNOSIS — M25511 Pain in right shoulder: Secondary | ICD-10-CM

## 2020-05-26 HISTORY — DX: Obstructive sleep apnea (adult) (pediatric): G47.33

## 2020-05-26 NOTE — Progress Notes (Signed)
Office Visit Note   Patient: Angela [name]           Date of Birth: 06-17-1945           MRN: [id number] Visit Date: 05/26/2020 Requested by: Janie Morning, DO [address],  [place] [address] PCP: Janie Morning, DO  Subjective: Chief Complaint  Patient presents with  . Right Shoulder - Pain    +Pain Last inj given 02/13/2020. Inj helped some. Right handed.  Limited ROM  Takes Aspirin sometimes.  Cannot sleep on her side. Some neck pain at times. Pain down arm. +weakness No prev. NCS/EMG    HPI: 75yo F presenting to clinic with concerns of recurrent right shoulder pain, requesting injection. Her last injection was approximately 3.5 mo ago, and she states she felt 'Completely fine!' for several weeks following this. Unfortunately, over the past 2-3 weeks, her pain has returned and left her once again with severe pain when trying to lift her arm, especially in the mornings. She says she would like to try another steroid shot today, given her great relief in the past. She is currently in the process of moving out of her house, and doesn't feel that she could handle the convalescence required for a shoulder replacement at this time. She has no other concerns today.                ROS:   All other systems were reviewed and are negative.  Objective: Vital Signs: There were no vitals taken for this visit.  Physical Exam:  General:  Alert and oriented, in no acute distress. Pulm:  Breathing unlabored. Psy:  Normal mood, congruent affect. Skin:  Right shoulder with no bruising, rashes, or erythema. Overlying skin intact.   Endorses pain with abduction of right shoulder beyond 90 degrees. Able to tolerate flexion to approximately 100 degrees.   Imaging/Procedure: US Guided Needle Placement  Result Date: 05/26/2020 Ultrasound guided injection is preferred based studies that show increased duration, increased effect, greater accuracy, decreased procedural pain,  increased response rate, and decreased cost with ultrasound guided versus blind injection.   Verbal informed consent obtained.  Time-out conducted.  Noted no overlying erythema, induration, or other signs of local infection. Ultrasound-guided right glenohumeral injection: After sterile prep with Betadine, injected 4 cc 0.25% bupivocaine without epinephrine and 6 mg betamethasone using a 22-gauge spinal needle, passing the needle from posterior approach into the glenohumeral joint.  Injectate seen filling joint capsule.  Good immediate relief.     Assessment & Plan: Pleasant 75yo F with known severe shoulder OA, presenting to clinic for R shoulder injection. Procedure performed as described above, which patient tolerated very well.  - Aftercare and return precautions discussed - RTC if symptoms worsen or fail to improve  - Patient had no further questions or concerns today.     PMFS History: Patient Active Problem List   Diagnosis Date Noted  . Attention deficit hyperactivity disorder, predominantly inattentive type 07/25/2019  . Chronic pain syndrome 07/25/2019  . Irritable bowel syndrome 07/25/2019  . Urethral caruncle 07/25/2019  . Thyroid dysfunction 07/25/2019  . Osteoporosis 07/09/2019  . Hyperprolactinemia ([place]) 07/09/2019  . Trochanteric bursitis, left hip 06/28/2017  . Chronic, continuous use of opioids 10/12/2016  . 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 Other retention of urine 10/12/2016  . Vaginal atrophy 10/12/2016  . 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 New onset headache 01/19/2016  . Excessive sleepiness 01/19/2016  . HYPERTENSION, BENIGN 03/10/2009  . HYPERTENSION,  UNCONTROLLED 01/29/2009  . ARM PAIN, LEFT 01/29/2009   Past Medical History:  Diagnosis Date  . Cancer ([place])    skin  . Chronic pain   . Complication of anesthesia   . 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 Headache   .  Hypertension   . Hypothyroidism   . Joint disorder   . Mitral valve prolapse   . Pneumonia   . PONV (postoperative nausea and vomiting)   . Porphyria ([place])    "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)  . Sleep apnea     Family History  Problem Relation Age of Onset  . Breast cancer Mother   . Rheum arthritis Mother   . Aneurysm Father   . Heart disease Father   . Throat cancer Father   . Melanoma Father   . Heart disease Brother   . Heart disease Brother   . Thyroid disease Brother   . Post-traumatic stress disorder Brother   . Rheum arthritis Brother   . Polymyositis Brother   . Obesity Brother     Past Surgical History:  Procedure Laterality Date  . ABDOMINAL HYSTERECTOMY     Age 89  . ANKLE SURGERY     x3  . APPENDECTOMY    . BIOPSY  10/19/2018   Procedure: BIOPSY;  Surgeon: Ronnette Juniper, MD;  Location: [facility];  Service: Gastroenterology;;  . BIOPSY  04/11/2020   Procedure: BIOPSY;  Surgeon: Ronnette Juniper, MD;  Location: [facility];  Service: Gastroenterology;;  . COLONOSCOPY WITH PROPOFOL N/A 10/19/2018   Procedure: COLONOSCOPY WITH PROPOFOL;  Surgeon: Ronnette Juniper, MD;  Location: [facility];  Service: Gastroenterology;  Laterality: N/A;  . COLONOSCOPY WITH PROPOFOL N/A 04/11/2020   Procedure: COLONOSCOPY WITH PROPOFOL;  Surgeon: Ronnette Juniper, MD;  Location: [facility];  Service: Gastroenterology;  Laterality: N/A;  (difficulty airway)   . DILATION AND CURETTAGE OF UTERUS     x 4  . ESOPHAGOGASTRODUODENOSCOPY (EGD) WITH PROPOFOL N/A 10/19/2018   Procedure: ESOPHAGOGASTRODUODENOSCOPY (EGD) WITH PROPOFOL;  Surgeon: Ronnette Juniper, MD;  Location: [facility];  Service: Gastroenterology;  Laterality: N/A;  . FRACTURE SURGERY     jaw  . KNEE SURGERY     x 3  . KYPHOPLASTY N/A 08/07/2019   Procedure: Thoracic twelve Vertebral Augmentation with Stenting/SpineJack;  Surgeon: Vallarie Mare, MD;  Location: [facility];  Service: Neurosurgery;   Laterality: N/A;  . MANDIBLE FRACTURE SURGERY     x 19  . POLYPECTOMY  10/19/2018   Procedure: POLYPECTOMY;  Surgeon: Ronnette Juniper, MD;  Location: [facility];  Service: Gastroenterology;;  . POLYPECTOMY  04/11/2020   Procedure: POLYPECTOMY;  Surgeon: Ronnette Juniper, MD;  Location: [facility];  Service: Gastroenterology;;   Social History   Occupational History  . Occupation: Part-time Secondary school teacher  Tobacco Use  . Smoking status: Former Research scientist (life sciences)  . Smokeless tobacco: Never Used  . Tobacco comment: Quit January 1980  Vaping Use  . Vaping Use: Never used  Substance and Sexual Activity  . Alcohol use: Yes    Comment: wine 4-5 days per week  . Drug use: No  . Sexual activity: Not on file

## 2020-05-26 NOTE — Addendum Note (Signed)
Addended by: Darleen Crocker on: 05/26/2020 04:38 PM   Modules accepted: Orders

## 2020-05-26 NOTE — Progress Notes (Signed)
I saw and examined the patient with Dr. Elouise Munroe and agree with assessment and plan as outlined.    Right shoulder GH injection given today.  Return as needed.

## 2020-05-26 NOTE — Telephone Encounter (Signed)
  Hi Megan ,   Last sleep study is from 2018 or 17- definitely needs  to be repeated.  The baseline was all OSA,  Which would be a form of apnea treatable with inspire.BMI is perfect for inspire.    Let's repeat the sleep study and meet for discussion of results and Inspire referral after. CD

## 2020-05-26 NOTE — Telephone Encounter (Signed)
Myriam Jacobson please place order for sleep study if patient is amendable. thanks

## 2020-05-26 NOTE — Telephone Encounter (Signed)
Called the patient and advised that Dr Brett Fairy would like to pursue a baseline study to just verify the apnea that is present and confirm if she would be a candidate for the inspire based of up to date study.  Pt is agreeable to this plan and advised order would be placed and once the study is reviewed then Dr Dohmeier will have a better idea if she would be a candidate. Pt verbalized understanding. Pt had no questions at this time but was encouraged to call back if questions arise.

## 2020-05-26 NOTE — Telephone Encounter (Signed)
Dr. Brett Fairy,   Please review chart and let me know if you feel this patient is a candidate for inspire?  Angela [name]

## 2020-05-28 DIAGNOSIS — E063 Autoimmune thyroiditis: Secondary | ICD-10-CM | POA: Insufficient documentation

## 2020-05-28 DIAGNOSIS — R7989 Other specified abnormal findings of blood chemistry: Secondary | ICD-10-CM | POA: Insufficient documentation

## 2020-06-04 DIAGNOSIS — Z124 Encounter for screening for malignant neoplasm of cervix: Secondary | ICD-10-CM | POA: Diagnosis not present

## 2020-06-04 DIAGNOSIS — Z779 Other contact with and (suspected) exposures hazardous to health: Secondary | ICD-10-CM | POA: Diagnosis not present

## 2020-06-04 DIAGNOSIS — N760 Acute vaginitis: Secondary | ICD-10-CM | POA: Diagnosis not present

## 2020-06-04 DIAGNOSIS — Z681 Body mass index (BMI) 19 or less, adult: Secondary | ICD-10-CM | POA: Diagnosis not present

## 2020-06-11 DIAGNOSIS — E237 Disorder of pituitary gland, unspecified: Secondary | ICD-10-CM | POA: Diagnosis not present

## 2020-06-11 DIAGNOSIS — E233 Hypothalamic dysfunction, not elsewhere classified: Secondary | ICD-10-CM | POA: Diagnosis not present

## 2020-06-11 DIAGNOSIS — E2740 Unspecified adrenocortical insufficiency: Secondary | ICD-10-CM | POA: Diagnosis not present

## 2020-06-11 DIAGNOSIS — D352 Benign neoplasm of pituitary gland: Secondary | ICD-10-CM | POA: Diagnosis not present

## 2020-06-19 ENCOUNTER — Other Ambulatory Visit: Payer: Self-pay | Admitting: Adult Health

## 2020-06-19 MED ORDER — AMPHETAMINE-DEXTROAMPHETAMINE 10 MG PO TABS: 10.0000 mg | ORAL_TABLET | Freq: Every day | ORAL | 0 refills | Status: DC

## 2020-06-19 NOTE — Telephone Encounter (Signed)
Pt request refill  amphetamine-dextroamphetamine (ADDERALL) 10 MG tablet at [address] AT [place]. Pt said out of medication

## 2020-06-19 NOTE — Telephone Encounter (Signed)
I called patient let her know the prescription for her Adderall was ready it did go to [facility] [address] [facility] and she was okay with this.

## 2020-07-10 ENCOUNTER — Ambulatory Visit (INDEPENDENT_AMBULATORY_CARE_PROVIDER_SITE_OTHER): Payer: Medicare Other | Admitting: Neurology

## 2020-07-10 DIAGNOSIS — G4733 Obstructive sleep apnea (adult) (pediatric): Secondary | ICD-10-CM

## 2020-07-10 DIAGNOSIS — G4710 Hypersomnia, unspecified: Secondary | ICD-10-CM

## 2020-07-10 DIAGNOSIS — Z789 Other specified health status: Secondary | ICD-10-CM

## 2020-07-11 ENCOUNTER — Other Ambulatory Visit: Payer: Self-pay

## 2020-07-17 DIAGNOSIS — N952 Postmenopausal atrophic vaginitis: Secondary | ICD-10-CM | POA: Diagnosis not present

## 2020-07-21 ENCOUNTER — Other Ambulatory Visit: Payer: Self-pay | Admitting: Adult Health

## 2020-07-21 MED ORDER — AMPHETAMINE-DEXTROAMPHETAMINE 10 MG PO TABS: 10.0000 mg | ORAL_TABLET | Freq: Every day | ORAL | 0 refills | Status: DC

## 2020-07-21 NOTE — Telephone Encounter (Signed)
Per [place] registry, pt last filled a 30 day supply on 06/19/2020. Next appt is scheduled for 11/03/20. Rx refill sent to [facility] NP.

## 2020-07-21 NOTE — Telephone Encounter (Signed)
Pt is needing a refill on her amphetamine-dextroamphetamine (ADDERALL) 10 MG tablet sent to the [facility] on [address]

## 2020-07-24 ENCOUNTER — Telehealth: Payer: Self-pay | Admitting: Neurology

## 2020-07-24 ENCOUNTER — Other Ambulatory Visit: Payer: Self-pay | Admitting: Neurology

## 2020-07-24 DIAGNOSIS — R0902 Hypoxemia: Secondary | ICD-10-CM

## 2020-07-24 DIAGNOSIS — G4733 Obstructive sleep apnea (adult) (pediatric): Secondary | ICD-10-CM

## 2020-07-24 DIAGNOSIS — G4710 Hypersomnia, unspecified: Secondary | ICD-10-CM

## 2020-07-24 NOTE — Progress Notes (Signed)
Patient slept well, 90% sleep efficiency- AHI overall was 19.3/h , mild- moderate OSA range- had many brief and some sustained hypoxia events.  Hypoxia is usually an exclusion criterion for INSPIRE, but the patient's hypoxic events stopped suddently after a bathroom break- and I can see no evidence that the pulse-oximetry failed after 3.15- it may have been a positional effect?   1. I like to have the patient use an overnight pulsoximetry for 2 nights, one when sleeping in her bed, one night with a wedge or in a recliner- if positional changes alone correct hypoxia, she would be a candidate for Inspire, if not, she is not.

## 2020-07-24 NOTE — Telephone Encounter (Signed)
-----   Message from Larey Seat, MD sent at 07/24/2020 10:13 AM EDT ----- Patient slept well, 90% sleep efficiency- AHI overall was 19.3/h , mild- moderate OSA range- had many brief and some sustained hypoxia events.  Hypoxia is usually an exclusion criterion for INSPIRE, but the patient's hypoxic events stopped suddently after a bathroom break- and I can see no evidence that the pulse-oximetry failed after 3.15- it may have been a positional effect?   1. I like to have the patient use an overnight pulsoximetry for 2 nights, one when sleeping in her bed, one night with a wedge or in a recliner- if positional changes alone correct hypoxia, she would be a candidate for Inspire, if not, she is not.

## 2020-07-24 NOTE — Procedures (Signed)
PATIENT'S NAME:  Angela [name], [place] DOB:      05/12/1945      MR#:    [id number]     DATE OF RECORDING: 07/10/2020 S. Shaaron Adler M.D.:  Janie Morning, DO, Ward Givens, NP Study Performed:   Baseline Polysomnogram HISTORY:  Angela [name] is a 75 year old female with a history of obstructive sleep apnea and hypersomnia.   Patient reports that she is still not using the CPAP- she has a hard time using it.  She does not like the tubes lay across her at night.  She would be interested in trying a different device like the inspire.   She continues to take Adderall 20 mg daily.  She reports excessive daytime sleepiness. She states that she is still fatigued and would like to increase the Adderall.  Sleep study to evaluate for possible apnea treatment with inspire.   The patient endorsed the Epworth Sleepiness Scale at 19/24 points.   The patient's weight 120 pounds with a height of 66 (inches), resulting in a BMI of 19.1 kg/m2. The patient's neck circumference measured 13.5 inches.  CURRENT MEDICATIONS: Adderall, B -Vitamins, Dulcolax, Subutex, Vitamin D3, Vitamin B-12, Estradiol, Lasix, Flagyl, Zofran, Micro-K. Armour thyroid, Diovan-HCT, Zinc Gluconate   PROCEDURE:  This is a multichannel digital polysomnogram utilizing the Somnostar 11.2 system.  Electrodes and sensors were applied and monitored per AASM Specifications.   EEG, EOG, Chin and Limb EMG, were sampled at 200 Hz.  ECG, Snore and Nasal Pressure, Thermal Airflow, Respiratory Effort, CPAP Flow and Pressure, Oximetry was sampled at 50 Hz. Digital video and audio were recorded.      BASELINE STUDY: Lights Out was at 22:28 and Lights On at 05:33.  Total recording time (TRT) was 425.5 minutes, with a total sleep time (TST) of 384.5 minutes.   The patient's sleep latency was 1.5 minutes.  REM latency was 225 minutes.  The sleep efficiency was 90.4 %.     SLEEP ARCHITECTURE: WASO (Wake after sleep onset) was 39.5 minutes.  There were 36 minutes in  Stage N1, 297.5 minutes Stage N2, 34 minutes Stage N3 and 17 minutes in Stage REM.  The percentage of Stage N1 was 9.4%, Stage N2 was 77.4%, Stage N3 was 8.8% and Stage R (REM sleep) was 4.4%.    RESPIRATORY ANALYSIS:  There were a total of 124 respiratory events:  38 obstructive apneas, 0 central apneas and 86 hypopneas.      The total APNEA/HYPOPNEA INDEX (AHI) was 19.3/hour. 1 event occurred in REM sleep and 172 events in NREM. The REM AHI was  3.5 /hour, versus a non-REM AHI of 20.1. The patient spent 200.5 minutes of total sleep time in the supine position and 184 minutes in non-supine. The supine AHI was 28.5/h versus a non-supine AHI of 9.4.  OXYGEN SATURATION & C02:  The Wake baseline 02 saturation was 98%, with the lowest being 73%. Time spent below 89% saturation equaled 75 minutes.  The arousals were noted as: 21 were spontaneous, 0 were associated with PLMs, 10 were associated with respiratory events. The patient had a total of 0 Periodic Limb Movements.   Audio and video analysis did not show any abnormal or unusual movements, behaviors, phonations or vocalizations.  Rather loud Snoring was noted. EKG was in keeping with normal sinus rhythm (NSR).   IMPRESSION:  Mild- moderate Obstructive Sleep Apnea (OSA) with an AHI of 19/h but associated with sustained hypoxia.  Hypoxia was present in NREM and REM sleep- SpO2 nadir  of 73%. Hypopneas were far more prevalent than Apneas- this indicates a hypoventilation tendency, not so much an obstruction at the upper airway.  After a bathroom break at 3.15 AM there was an interruption of pulse oximetry data, and the rest of the night did not indicate low oxygen levels- this remains unexplained.     RECOMMENDATIONS:  I like to have the patient use an overnight pulsoximetry for 2 nights, one when sleeping in her bed, one night with a wedge or in a recliner- if positional changes alone correct hypoxia, she would be a candidate for Inspire, if  not, she is not.    I certify that I have reviewed the entire raw data recording prior to the issuance of this report in accordance with the Standards of Accreditation of the American Academy of Sleep Medicine (AASM)    Larey Seat, MD Diplomat, American Board of Psychiatry and Neurology  Diplomat, American Board of Sleep Medicine Market researcher, [facility] [employer]

## 2020-07-24 NOTE — Addendum Note (Signed)
Addended by: Larey Seat on: 07/24/2020 10:14 AM   Modules accepted: Orders

## 2020-07-24 NOTE — Telephone Encounter (Signed)
Called the patient to review the sleep study and was able t review in detail. Informed that Dr Brett Fairy wants to complete a 2 night ONO with the patient to evaluate oxygen levels a little further. Advised that first night sleep in bed normal and the second night either sleep in a recliner or on her side propped up. Pt verbalized understanding. Spent a total of 15 min on the phone answering questions the patient had about the sleep study and the previous sleep study. The patient will await to hear from [facility])  about getting set up with ONO.

## 2020-08-08 DIAGNOSIS — E559 Vitamin D deficiency, unspecified: Secondary | ICD-10-CM | POA: Diagnosis not present

## 2020-08-08 DIAGNOSIS — E039 Hypothyroidism, unspecified: Secondary | ICD-10-CM | POA: Diagnosis not present

## 2020-08-08 DIAGNOSIS — I129 Hypertensive chronic kidney disease with stage 1 through stage 4 chronic kidney disease, or unspecified chronic kidney disease: Secondary | ICD-10-CM | POA: Diagnosis not present

## 2020-08-14 DIAGNOSIS — E7800 Pure hypercholesterolemia, unspecified: Secondary | ICD-10-CM | POA: Diagnosis not present

## 2020-08-14 DIAGNOSIS — E039 Hypothyroidism, unspecified: Secondary | ICD-10-CM | POA: Diagnosis not present

## 2020-08-14 DIAGNOSIS — E559 Vitamin D deficiency, unspecified: Secondary | ICD-10-CM | POA: Diagnosis not present

## 2020-08-14 DIAGNOSIS — I129 Hypertensive chronic kidney disease with stage 1 through stage 4 chronic kidney disease, or unspecified chronic kidney disease: Secondary | ICD-10-CM | POA: Diagnosis not present

## 2020-08-14 DIAGNOSIS — F419 Anxiety disorder, unspecified: Secondary | ICD-10-CM | POA: Diagnosis not present

## 2020-08-14 DIAGNOSIS — R7309 Other abnormal glucose: Secondary | ICD-10-CM | POA: Diagnosis not present

## 2020-08-14 DIAGNOSIS — E538 Deficiency of other specified B group vitamins: Secondary | ICD-10-CM | POA: Diagnosis not present

## 2020-08-18 ENCOUNTER — Other Ambulatory Visit: Payer: Self-pay | Admitting: Adult Health

## 2020-08-18 NOTE — Telephone Encounter (Signed)
Pt requesting refill for amphetamine-dextroamphetamine (ADDERALL) 10 MG tablet. [place] [phone]

## 2020-08-18 NOTE — Telephone Encounter (Signed)
Per [place] registry, last filled on 07/21/2020 Dextroamp-Amphetamin 10 Mg Tab #60/30 prescribed by MM NP. Pt has pending appt on 11/03/20. Refill sent to [facility] NP.

## 2020-08-19 ENCOUNTER — Telehealth: Payer: Self-pay | Admitting: Adult Health

## 2020-08-19 MED ORDER — AMPHETAMINE-DEXTROAMPHETAMINE 10 MG PO TABS: 10.0000 mg | ORAL_TABLET | Freq: Every day | ORAL | 0 refills | Status: DC

## 2020-08-19 NOTE — Telephone Encounter (Signed)
Spoke with both pt and pharmacy. Earliest fill date is listed on Rx as 08/21/20. Pt last filled 30 day supply on 07/21/20. Today is day 30. Rx sent to [facility] NP to approve so patient can fill this today.

## 2020-08-19 NOTE — Telephone Encounter (Signed)
Pt called wanting to speak to the provider due to information that the pharmacy has informed her about her amphetamine-dextroamphetamine (ADDERALL) 10 MG tablet  Pt states that there was a change in how she is to take the Adderall and she is wanting to discuss why she was not informed that there was going to be a change. Please advise.

## 2020-08-20 ENCOUNTER — Encounter: Payer: Self-pay | Admitting: Orthopaedic Surgery

## 2020-08-20 ENCOUNTER — Ambulatory Visit (INDEPENDENT_AMBULATORY_CARE_PROVIDER_SITE_OTHER): Payer: Medicare Other | Admitting: Orthopaedic Surgery

## 2020-08-20 ENCOUNTER — Other Ambulatory Visit: Payer: Self-pay

## 2020-08-20 DIAGNOSIS — M79672 Pain in left foot: Secondary | ICD-10-CM | POA: Diagnosis not present

## 2020-08-20 DIAGNOSIS — M79671 Pain in right foot: Secondary | ICD-10-CM | POA: Diagnosis not present

## 2020-08-20 NOTE — Progress Notes (Signed)
The patient comes in today with an odd sensation she states under the balls of both her feet or the metatarsal areas where she points.  She denies any injury.  She denies any numbness and tingling.  She felt they were numb at first but says it is really a sensation as if the socks are bunched up in her shoes.  Examination of both her feet show no deformities I can see at all.  She is neurovascular intact.  Her shoewear looks normal to me.  There is no deformities of the toes or the midfoot on either foot.  Both feet are well-perfused and have good range of motion of the toes as well as her ankles.  She is 75 years old.  Certainly she could have some type of metatarsalgia.  I recommended looking into custom inserts for better shoe wear and I did describe some places for her to go.  She can also additionally see someone such as Dr. Thurmond Butts podiatrist with the [facility] who she has seen in the past for a toe issue remotely.  He could probably provide great device for her as well.

## 2020-08-28 ENCOUNTER — Other Ambulatory Visit: Payer: Self-pay | Admitting: Adult Health

## 2020-08-28 MED ORDER — AMPHETAMINE-DEXTROAMPHETAMINE 10 MG PO TABS: 10.0000 mg | ORAL_TABLET | Freq: Every day | ORAL | 0 refills | Status: DC

## 2020-08-28 NOTE — Telephone Encounter (Signed)
Checked [place] registry, last filled on 07/21/2020 #60. Will send to Dr Brett Fairy as Jinny Blossom NP is out of office.

## 2020-08-28 NOTE — Telephone Encounter (Signed)
Pt is asking the Rx for amphetamine-dextroamphetamine (ADDERALL) 10 MG tablet  be cx at [place] [phone] has been told this pharmacy will not have this medication before Sept.).  [facility] [address] [facility] [phone]

## 2020-09-24 ENCOUNTER — Other Ambulatory Visit: Payer: Self-pay | Admitting: Adult Health

## 2020-09-24 MED ORDER — AMPHETAMINE-DEXTROAMPHETAMINE 10 MG PO TABS: 10.0000 mg | ORAL_TABLET | Freq: Every day | ORAL | 0 refills | Status: DC

## 2020-09-24 NOTE — Telephone Encounter (Signed)
Pt is requesting a refill for amphetamine-dextroamphetamine (ADDERALL) 10 MG tablet .  Pharmacy: [id number]  

## 2020-10-22 ENCOUNTER — Other Ambulatory Visit: Payer: Self-pay | Admitting: Adult Health

## 2020-10-22 MED ORDER — AMPHETAMINE-DEXTROAMPHETAMINE 10 MG PO TABS: 10.0000 mg | ORAL_TABLET | Freq: Every day | ORAL | 0 refills | Status: DC

## 2020-10-22 NOTE — Telephone Encounter (Signed)
Pt requesting refill for amphetamine-dextroamphetamine (ADDERALL) 10 MG tablet. Pharmacy [place] [phone].

## 2020-10-28 MED ORDER — AMPHETAMINE-DEXTROAMPHETAMINE 10 MG PO TABS: 10.0000 mg | ORAL_TABLET | Freq: Every day | ORAL | 0 refills | Status: DC

## 2020-10-28 NOTE — Telephone Encounter (Signed)
[facility] the [facility]-dextroamphetamine (ADDERALL) 10 MG tablet. Pt would [id number] [place] [place] - [address].

## 2020-10-28 NOTE — Telephone Encounter (Signed)
Checked drug registry again. Last filled on 09/24/2020 Dextroamp-Amphetamin 10 Mg Tab #60/30. Will send Rx request to [facility] NP.

## 2020-10-31 ENCOUNTER — Other Ambulatory Visit: Payer: Self-pay

## 2020-10-31 ENCOUNTER — Ambulatory Visit: Payer: Self-pay

## 2020-10-31 ENCOUNTER — Ambulatory Visit (INDEPENDENT_AMBULATORY_CARE_PROVIDER_SITE_OTHER): Payer: Medicare Other | Admitting: Orthopaedic Surgery

## 2020-10-31 ENCOUNTER — Encounter: Payer: Self-pay | Admitting: Orthopaedic Surgery

## 2020-10-31 DIAGNOSIS — G8929 Other chronic pain: Secondary | ICD-10-CM

## 2020-10-31 DIAGNOSIS — M542 Cervicalgia: Secondary | ICD-10-CM

## 2020-10-31 DIAGNOSIS — M25511 Pain in right shoulder: Secondary | ICD-10-CM

## 2020-10-31 DIAGNOSIS — M4722 Other spondylosis with radiculopathy, cervical region: Secondary | ICD-10-CM

## 2020-10-31 NOTE — Progress Notes (Signed)
Office Visit Note   Patient: Angela [name]           Date of Birth: 12-Sep-1945           MRN: [id number] Visit Date: 10/31/2020              Requested by: Janie Morning, DO [place],  [place] [address] PCP: Janie Morning, DO   Assessment & Plan: Visit Diagnoses:  1. Chronic right shoulder pain   2. Neck pain   3. Other spondylosis with radiculopathy, cervical region     Plan: We will set patient up for cervical MRI scan she said persistent symptoms failed treatment.  When she has had cortisone injections orally or intramuscularly she notes relief short-term and then recurrence of symptoms a week later.  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  Follow-up after cervical MRI scan.  CT scan was reviewed with patient cervical spine last year in August from neck trauma.  Follow-Up Instructions: No follow-ups on file.   Orders:  Orders Placed This Encounter  Procedures   XR Shoulder Right   XR Cervical Spine 2 or 3 views   MR Cervical Spine w/o contrast   No orders of the defined types were placed in this encounter.     Procedures: No procedures performed   Clinical Data: No additional findings.   Subjective: Chief Complaint  Patient presents with   Right Shoulder - Pain    HPI 75 year old female with chronic shoulder pain greater than a year.  She has had an MRI scan of her shoulder which shows glenohumeral arthritis.  Pain is on the right side of her neck radiates down her arm and also down into her hand along the radial aspect of her hand C6 distribution.  She is able get her arm up overhead and does not seem to have severe pain when she moves her arm internal/external rotation dressing other than mild discomfort.  Patient states the pain is a burning type pain and sometimes is worse when she moves her neck.  Patient states she was told it was primarily her shoulder but she was referred here by  her PCP for further evaluation.  Today's visit we reviewed MRI scan cervical spine.  Review of Systems positive for sleep apnea hypertension osteoporosis.  Right shoulder osteoarthritis.  Positive for neck pain and right arm and hand numbness.   Objective: Vital Signs: There were no vitals taken for this visit.  Physical Exam Constitutional:      Appearance: She is well-developed.  HENT:     Head: Normocephalic.     Right Ear: External ear normal.     Left Ear: External ear normal. There is no impacted cerumen.  Eyes:     Pupils: Pupils are equal, round, and reactive to light.  Neck:     Thyroid: No thyromegaly.     Trachea: No tracheal deviation.  Cardiovascular:     Rate and Rhythm: Normal rate.  Pulmonary:     Effort: Pulmonary effort is normal.  Abdominal:     Palpations: Abdomen is soft.  Musculoskeletal:     Cervical back: No rigidity.  Skin:    General: Skin is warm and dry.  Neurological:     Mental Status: She is alert and oriented to person, place, and time.  Psychiatric:        Behavior: Behavior normal.    Ortho Exam reflexes are 2+ and symmetrical there  is brachial plexus tenderness on the left and right.  Positive Spurling on the right.  No biceps triceps weakness.  Passive range of motion of shoulder and active resistive range of motion shoulder is only mildly uncomfortable.  Long head of the biceps minimal tenderness.  She can get her arm up overhead easily reach past the posterior axillary line.  Specialty Comments:  No specialty comments available.  Imaging:CLINICAL DATA:  Right shoulder pain and weakness radiating down the right arm.   EXAM: MRI OF THE RIGHT SHOULDER WITHOUT CONTRAST   TECHNIQUE: Multiplanar, multisequence MR imaging of the shoulder was performed. No intravenous contrast was administered.   COMPARISON:  01/18/2019 MRI   FINDINGS: Rotator cuff: Mild to moderate supraspinatus tendinopathy with mild subscapularis tendinopathy. On  image 12 of series 4 there is suspicion for a small partial thickness articular surface tear of the supraspinatus tendon. No full-thickness rotator cuff tear is identified.   Muscles:  Unremarkable   Biceps long head:  Unremarkable   Acromioclavicular Joint: Mild spurring and minimal subcortical marrow edema compatible with mild degenerative AC joint arthropathy. Type II acromion. There is a moderate amount of fluid in the subacromial subdeltoid bursa compatible with bursitis.   Glenohumeral Joint: Severe degenerative glenohumeral arthropathy with subcortical marrow edema confluent throughout the glenoid and along the superomedial humeral head, severe articular cartilage thinning, and some degree of suspected degenerative subcortical cyst formation along the anterior glenoid. Small joint effusion with mild thickening of the posterior band of the inferior glenohumeral ligament which may reflect mild localized synovitis. Coracohumeral ligament within normal limits.   Labrum:  No gross tear is identified.   Bones: No significant extra-articular osseous abnormalities identified.   Other: No supplemental non-categorized findings.   IMPRESSION: 1. Moderate subacromial subdeltoid bursitis. 2. Mild to moderate supraspinatus and mild subscapularis tendinopathy. Suspected small partial thickness articular surface tear of the supraspinatus tendon. No full-thickness rotator cuff tear identified. 3. Severe (and worsened) degenerative glenohumeral arthropathy. Mild degenerative AC joint arthropathy. 4. Small glenohumeral joint effusion with mild localized synovitis/thickening along the posterior band of the inferior glenohumeral ligament.     Electronically Signed   By: Van Clines M.D.   On: 02/10/2020 17:50   CLINICAL DATA:  Neck trauma.   EXAM: CT CERVICAL SPINE WITHOUT CONTRAST   TECHNIQUE: Multidetector CT imaging of the cervical spine was performed  without intravenous contrast. Multiplanar CT image reconstructions were also generated.   COMPARISON:  MRI of the cervical spine 10/05/2015.   FINDINGS: Alignment: Straightening of the expected cervical lordosis. No significant spondylolisthesis.   Skull base and vertebrae: The basion-dental and atlanto-dental intervals are maintained.No evidence of acute fracture to the cervical spine.   Soft tissues and spinal canal: No prevertebral fluid or swelling. No visible canal hematoma.   Disc levels: Cervical spondylosis with multilevel disc space narrowing, disc bulges and uncovertebral hypertrophy. Disc space narrowing is severe at C3-C4, C5-C6 and C6-C7.   Upper chest: No consolidation within the imaged lung apices. Biapical pleuroparenchymal scarring.   Other: Postoperative changes to the mandible bilaterally. Subcentimeter calcified nodule within the posterior right thyroid lobe, not meeting consensus criteria for ultrasound follow-up.   IMPRESSION: No evidence of acute fracture to the cervical spine.   Cervical spondylosis as described.     Electronically Signed   By: Kellie Simmering DO   On: 08/31/2019 13:35   PMFS History: Patient Active Problem List   Diagnosis Date Noted   Other spondylosis with radiculopathy, cervical region 11/03/2020  OSA on CPAP 05/26/2020   Attention deficit hyperactivity disorder, predominantly inattentive type 07/25/2019   Chronic pain syndrome 07/25/2019   Irritable bowel syndrome 07/25/2019   Urethral caruncle 07/25/2019   Thyroid dysfunction 07/25/2019   Osteoporosis 07/09/2019   Hyperprolactinemia ([place]) 07/09/2019   Trochanteric bursitis, left hip 06/28/2017   Chronic, continuous use of opioids 10/12/2016   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/12/2016   Other retention of urine 10/12/2016   Vaginal atrophy 10/12/2016   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   New onset headache 01/19/2016   Excessive sleepiness 01/19/2016   HYPERTENSION,  BENIGN 03/10/2009   HYPERTENSION, UNCONTROLLED 01/29/2009   ARM PAIN, LEFT 01/29/2009   Past Medical History:  Diagnosis Date   Cancer ([place])    skin   Chronic pain    Complication of anesthesia    Difficult intubation    19 jaw/TMJ surgeries with "bone fused to skull" leading to very limited mouth opening   Headache    Hypertension    Hypothyroidism    Joint disorder    Mitral valve prolapse    Pneumonia    PONV (postoperative nausea and vomiting)    Porphyria ([place])    "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)   Sleep apnea     Family History  Problem Relation Age of Onset   Breast cancer Mother    Rheum arthritis Mother    Aneurysm Father    Heart disease Father    Throat cancer Father    Melanoma Father    Heart disease Brother    Heart disease Brother    Thyroid disease Brother    Post-traumatic stress disorder Brother    Rheum arthritis Brother    Polymyositis Brother    Obesity Brother     Past Surgical History:  Procedure Laterality Date   ABDOMINAL HYSTERECTOMY     Age 70   ANKLE SURGERY     x3   APPENDECTOMY     BIOPSY  10/19/2018   Procedure: BIOPSY;  Surgeon: Ronnette Juniper, MD;  Location: [facility];  Service: Gastroenterology;;   BIOPSY  04/11/2020   Procedure: BIOPSY;  Surgeon: Ronnette Juniper, MD;  Location: [facility];  Service: Gastroenterology;;   COLONOSCOPY WITH PROPOFOL N/A 10/19/2018   Procedure: COLONOSCOPY WITH PROPOFOL;  Surgeon: Ronnette Juniper, MD;  Location: [facility];  Service: Gastroenterology;  Laterality: N/A;   COLONOSCOPY WITH PROPOFOL N/A 04/11/2020   Procedure: COLONOSCOPY WITH PROPOFOL;  Surgeon: Ronnette Juniper, MD;  Location: [facility];  Service: Gastroenterology;  Laterality: N/A;  (difficulty airway)    DILATION AND CURETTAGE OF UTERUS     x 4   ESOPHAGOGASTRODUODENOSCOPY (EGD) WITH PROPOFOL N/A 10/19/2018   Procedure: ESOPHAGOGASTRODUODENOSCOPY (EGD) WITH PROPOFOL;  Surgeon: Ronnette Juniper, MD;  Location: [facility];  Service: Gastroenterology;  Laterality: N/A;   FRACTURE SURGERY     jaw   KNEE SURGERY     x 3   KYPHOPLASTY N/A 08/07/2019   Procedure: Thoracic twelve Vertebral Augmentation with Stenting/SpineJack;  Surgeon: Vallarie Mare, MD;  Location: [facility];  Service: Neurosurgery;  Laterality: N/A;   MANDIBLE FRACTURE SURGERY     x 19   POLYPECTOMY  10/19/2018   Procedure: POLYPECTOMY;  Surgeon: Ronnette Juniper, MD;  Location: [facility];  Service: Gastroenterology;;   POLYPECTOMY  04/11/2020   Procedure: POLYPECTOMY;  Surgeon: Ronnette Juniper, MD;  Location: [facility];  Service: Gastroenterology;;   Social History   Occupational History   Occupation: Part-time Secondary school teacher  Tobacco Use   Smoking  status: Former   Smokeless tobacco: Never   Tobacco comments:    Quit January 1980  Vaping Use   Vaping Use: Never used  Substance and Sexual Activity   Alcohol use: Yes    Comment: wine 4-5 days per week   Drug use: No   Sexual activity: Not on file

## 2020-11-03 ENCOUNTER — Ambulatory Visit: Payer: Medicare Other | Admitting: Adult Health

## 2020-11-03 DIAGNOSIS — M4722 Other spondylosis with radiculopathy, cervical region: Secondary | ICD-10-CM | POA: Insufficient documentation

## 2020-11-03 HISTORY — DX: Other spondylosis with radiculopathy, cervical region: M47.22

## 2020-11-16 ENCOUNTER — Other Ambulatory Visit: Payer: Self-pay

## 2020-11-16 ENCOUNTER — Ambulatory Visit
Admission: RE | Admit: 2020-11-16 | Discharge: 2020-11-16 | Disposition: A | Discharge status: Home / Self Care | Payer: Medicare Other | Source: Ambulatory Visit | Attending: Orthopaedic Surgery | Admitting: Orthopaedic Surgery

## 2020-11-16 DIAGNOSIS — M542 Cervicalgia: Secondary | ICD-10-CM | POA: Diagnosis not present

## 2020-11-25 ENCOUNTER — Encounter: Payer: Self-pay | Admitting: Orthopaedic Surgery

## 2020-11-25 ENCOUNTER — Other Ambulatory Visit: Payer: Self-pay

## 2020-11-25 ENCOUNTER — Ambulatory Visit (INDEPENDENT_AMBULATORY_CARE_PROVIDER_SITE_OTHER): Payer: Medicare Other | Admitting: Orthopaedic Surgery

## 2020-11-25 VITALS — BP 103/65 | Ht 66.0 in | Wt 120.0 lb

## 2020-11-25 DIAGNOSIS — M4722 Other spondylosis with radiculopathy, cervical region: Secondary | ICD-10-CM | POA: Diagnosis not present

## 2020-11-25 NOTE — Progress Notes (Signed)
Office Visit Note   Patient: Angela [name]           Date of Birth: 1945-09-21           MRN: [id number] Visit Date: 11/25/2020              Requested by: Janie Morning, DO [place],  [place] [address] PCP: Janie Morning, DO   Assessment & Plan: Visit Diagnoses:  1. Other spondylosis with radiculopathy, cervical region     Plan: Patient has multilevel changes likely the C3-4 disc protrusion on the right is is causing her current symptoms.  She gets good relief with distraction and we will set her up for some home cervical traction as well as formal physical therapy and recheck her again in 2 months.  MRI scan was reviewed I gave her copy of the report pathophysiology discussed outlined treatment plan reviewed.  Follow-Up Instructions: No follow-ups on file.   Orders:  No orders of the defined types were placed in this encounter.  No orders of the defined types were placed in this encounter.     Procedures: No procedures performed   Clinical Data: No additional findings.   Subjective: Chief Complaint  Patient presents with   Neck - Follow-up    MRI cervical spine review    HPI 75 year old female returns with ongoing problems with right shoulder pain but also multilevel cervical spondylosis.  She had injections in her shoulder and notes her neck had been better but she has had pain that radiates down primarily C6 distribution of the right hand.  When her pain is bad she has problems moving her whole arm including her shoulder also elbow fingers.  Prednisone makes is significantly better.  Patient is right-hand dominant.  She had received multiple posterior glenohumeral injections with Dr. Junius Roads in the past.  Additionally she has some hypertension sleep apnea osteoporosis.  No myelopathic symptoms no chills or fever.  Review of Systems all other systems updated unchanged from 10/31/2020 other than as mentioned above in HPI.   Objective: Vital  Signs: BP 103/65   Ht 5\' 6"  (1.676 m)   Wt 120 lb (54.4 kg)   BMI 19.37 kg/m   Physical Exam Constitutional:      Appearance: She is well-developed.  HENT:     Head: Normocephalic.     Right Ear: External ear normal.     Left Ear: External ear normal. There is no impacted cerumen.  Eyes:     Pupils: Pupils are equal, round, and reactive to light.  Neck:     Thyroid: No thyromegaly.     Trachea: No tracheal deviation.  Cardiovascular:     Rate and Rhythm: Normal rate.  Pulmonary:     Effort: Pulmonary effort is normal.  Abdominal:     Palpations: Abdomen is soft.  Musculoskeletal:     Cervical back: No rigidity.  Skin:    General: Skin is warm and dry.  Neurological:     Mental Status: She is alert and oriented to person, place, and time.  Psychiatric:        Behavior: Behavior normal.    Ortho Exam increased pain with cervical compression relief with distraction.  Brachial plexus tenderness both right and left.  Negative Lhermitte.  Positive Spurling on the right negative on the left.  Biceps triceps is intact reflexes are symmetrical.  Specialty Comments:  No specialty comments available.  Imaging:CLINICAL DATA:  Neck pain radiating to the right  arm and hand. Right sided weakness.   EXAM: MRI CERVICAL SPINE WITHOUT CONTRAST   TECHNIQUE: Multiplanar, multisequence MR imaging of the cervical spine was performed. No intravenous contrast was administered.   COMPARISON:  Radiography 10/31/2020.  MRI 09/19/2019.   FINDINGS: Alignment: No malalignment.   Vertebrae: No fracture or focal bone lesion. Mild discogenic endplate changes at Z8-5 and C6-7.   Cord: No cord compression or focal cord lesion.   Posterior Fossa, vertebral arteries, paraspinal tissues: Negative   Disc levels:   Foramen magnum is widely patent.  C1-2 and C2-3 are normal.   C3-4: Bulging of the disc with uncovertebral prominence, right worse than left. No compressive canal stenosis.  Proximal foraminal narrowing right more than left. Some potential the right C4 nerve could be affected. Slight worsening since 2021.   C4-5: Endplate osteophytes and bulging of the disc. Narrowing of the ventral subarachnoid space but no compression of the cord. Mild proximal foraminal narrowing, not likely compressive. No apparent change.   C5-6: Endplate osteophytes and bulging of the disc. Mild narrowing of the subarachnoid space but no compressive effect upon the cord. Mild proximal foraminal narrowing, not likely compressive. No change.   C6-7: Endplate osteophytes and bulging of the disc. Narrowing of the ventral subarachnoid space but no compressive effect upon the cord. Mild proximal foraminal narrowing, not likely compressive. No change.   C7-T1: Normal interspace.   IMPRESSION: Multilevel cervical spondylosis as outlined above. No compressive stenosis of the central canal. At C3-4, there is uncovertebral disease more prominent on the right than the left with proximal foraminal encroachment that could possibly affect in particular the right C4 nerve. This has worsened slightly since 2021.   The degenerative changes otherwise at C4-5, C5-6 and C6-7 appear stable since the prior exam without apparent neural compression.     Electronically Signed   By: Nelson Chimes M.D.   On: 11/17/2020 11:11    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   Chronic pain syndrome 07/25/2019   Irritable bowel syndrome 07/25/2019   Urethral caruncle 07/25/2019   Thyroid dysfunction 07/25/2019   Osteoporosis 07/09/2019   Hyperprolactinemia ([place]) 07/09/2019   Trochanteric bursitis, left hip 06/28/2017   Chronic, continuous use of opioids 10/12/2016   High-tone pelvic floor dysfunction  10/12/2016   Other retention of urine 10/12/2016   Vaginal atrophy 10/12/2016   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   New onset headache 01/19/2016   Excessive sleepiness 01/19/2016   HYPERTENSION, BENIGN 03/10/2009   HYPERTENSION, UNCONTROLLED 01/29/2009   ARM PAIN, LEFT 01/29/2009   Past Medical History:  Diagnosis Date   Cancer ([place])    skin   Chronic pain    Complication of anesthesia    Difficult intubation    19 jaw/TMJ surgeries with "bone fused to skull" leading to very limited mouth opening   Headache    Hypertension    Hypothyroidism    Joint disorder    Mitral valve prolapse    Pneumonia    PONV (postoperative nausea and vomiting)    Porphyria ([place])    "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)   Sleep apnea     Family History  Problem Relation Age of Onset   Breast cancer Mother    Rheum arthritis Mother    Aneurysm Father    Heart disease Father  Throat cancer Father    Melanoma Father    Heart disease Brother    Heart disease Brother    Thyroid disease Brother    Post-traumatic stress disorder Brother    Rheum arthritis Brother    Polymyositis Brother    Obesity Brother     Past Surgical History:  Procedure Laterality Date   ABDOMINAL HYSTERECTOMY     Age 59   ANKLE SURGERY     x3   APPENDECTOMY     BIOPSY  10/19/2018   Procedure: BIOPSY;  Surgeon: Ronnette Juniper, MD;  Location: [facility];  Service: Gastroenterology;;   BIOPSY  04/11/2020   Procedure: BIOPSY;  Surgeon: Ronnette Juniper, MD;  Location: [facility];  Service: Gastroenterology;;   COLONOSCOPY WITH PROPOFOL N/A 10/19/2018   Procedure: COLONOSCOPY WITH PROPOFOL;  Surgeon: Ronnette Juniper, MD;  Location: [facility];  Service: Gastroenterology;  Laterality: N/A;   COLONOSCOPY WITH PROPOFOL N/A 04/11/2020   Procedure: COLONOSCOPY WITH PROPOFOL;  Surgeon: Ronnette Juniper, MD;  Location: [facility];  Service: Gastroenterology;  Laterality: N/A;  (difficulty airway)    DILATION AND  CURETTAGE OF UTERUS     x 4   ESOPHAGOGASTRODUODENOSCOPY (EGD) WITH PROPOFOL N/A 10/19/2018   Procedure: ESOPHAGOGASTRODUODENOSCOPY (EGD) WITH PROPOFOL;  Surgeon: Ronnette Juniper, MD;  Location: [facility];  Service: Gastroenterology;  Laterality: N/A;   FRACTURE SURGERY     jaw   KNEE SURGERY     x 3   KYPHOPLASTY N/A 08/07/2019   Procedure: Thoracic twelve Vertebral Augmentation with Stenting/SpineJack;  Surgeon: Vallarie Mare, MD;  Location: [facility];  Service: Neurosurgery;  Laterality: N/A;   MANDIBLE FRACTURE SURGERY     x 19   POLYPECTOMY  10/19/2018   Procedure: POLYPECTOMY;  Surgeon: Ronnette Juniper, MD;  Location: [facility];  Service: Gastroenterology;;   POLYPECTOMY  04/11/2020   Procedure: POLYPECTOMY;  Surgeon: Ronnette Juniper, MD;  Location: [facility];  Service: Gastroenterology;;   Social History   Occupational History   Occupation: Part-time Secondary school teacher  Tobacco Use   Smoking status: Former   Smokeless tobacco: Never   Tobacco comments:    Quit January 1980  Vaping Use   Vaping Use: Never used  Substance and Sexual Activity   Alcohol use: Yes    Comment: wine 4-5 days per week   Drug use: No   Sexual activity: Not on file

## 2020-11-26 ENCOUNTER — Other Ambulatory Visit: Payer: Self-pay | Admitting: Adult Health

## 2020-11-26 MED ORDER — AMPHETAMINE-DEXTROAMPHETAMINE 10 MG PO TABS: 10.0000 mg | ORAL_TABLET | Freq: Every day | ORAL | 0 refills | Status: DC

## 2020-11-26 NOTE — Telephone Encounter (Signed)
Pt is needing a refill request for her amphetamine-dextroamphetamine (ADDERALL) 10 MG tablet sent to the [facility] on [address]

## 2020-11-29 DIAGNOSIS — Z1231 Encounter for screening mammogram for malignant neoplasm of breast: Secondary | ICD-10-CM | POA: Diagnosis not present

## 2021-01-04 DIAGNOSIS — Z20822 Contact with and (suspected) exposure to covid-19: Secondary | ICD-10-CM | POA: Diagnosis not present

## 2021-01-06 ENCOUNTER — Other Ambulatory Visit: Payer: Self-pay | Admitting: Adult Health

## 2021-01-06 MED ORDER — AMPHETAMINE-DEXTROAMPHETAMINE 10 MG PO TABS: 10.0000 mg | ORAL_TABLET | Freq: Every day | ORAL | 0 refills | Status: DC

## 2021-01-06 NOTE — Telephone Encounter (Signed)
Pt request refill for amphetamine-dextroamphetamine (ADDERALL) 10 MG tablet at [id number]

## 2021-01-06 NOTE — Telephone Encounter (Signed)
Has appt 02-11-2021

## 2021-01-11 DIAGNOSIS — U071 COVID-19: Secondary | ICD-10-CM

## 2021-01-11 HISTORY — DX: COVID-19: U07.1

## 2021-01-27 ENCOUNTER — Ambulatory Visit: Payer: Medicare Other | Admitting: Orthopaedic Surgery

## 2021-02-05 ENCOUNTER — Telehealth: Payer: Self-pay | Admitting: Adult Health

## 2021-02-05 ENCOUNTER — Other Ambulatory Visit: Payer: Self-pay | Admitting: *Deleted

## 2021-02-05 NOTE — Telephone Encounter (Signed)
Message handled in other encounter.

## 2021-02-05 NOTE — Telephone Encounter (Signed)
Pt is requesting a refill for amphetamine-dextroamphetamine (ADDERALL) 10 MG tablet .  Pharmacy: [id number]  

## 2021-02-09 MED ORDER — AMPHETAMINE-DEXTROAMPHETAMINE 10 MG PO TABS: 10.0000 mg | ORAL_TABLET | Freq: Every day | ORAL | 0 refills | Status: DC

## 2021-02-10 ENCOUNTER — Telehealth: Payer: Self-pay | Admitting: *Deleted

## 2021-02-10 NOTE — Telephone Encounter (Signed)
Patient wanted to cancel appointment on 02/11/2021 to do more investigation on inspire.Made patient aware will put on wait list and if she sees she can come sooner she will call back and schedule a appointment to see Trixie Deis ,NP sooner . Patient thanked me for calling

## 2021-02-11 ENCOUNTER — Ambulatory Visit: Payer: Medicare Other | Admitting: Adult Health

## 2021-02-11 DIAGNOSIS — I129 Hypertensive chronic kidney disease with stage 1 through stage 4 chronic kidney disease, or unspecified chronic kidney disease: Secondary | ICD-10-CM | POA: Diagnosis not present

## 2021-02-11 DIAGNOSIS — E538 Deficiency of other specified B group vitamins: Secondary | ICD-10-CM | POA: Diagnosis not present

## 2021-02-11 DIAGNOSIS — E7800 Pure hypercholesterolemia, unspecified: Secondary | ICD-10-CM | POA: Diagnosis not present

## 2021-02-11 DIAGNOSIS — R7309 Other abnormal glucose: Secondary | ICD-10-CM | POA: Diagnosis not present

## 2021-02-11 DIAGNOSIS — E039 Hypothyroidism, unspecified: Secondary | ICD-10-CM | POA: Diagnosis not present

## 2021-02-11 DIAGNOSIS — E559 Vitamin D deficiency, unspecified: Secondary | ICD-10-CM | POA: Diagnosis not present

## 2021-03-10 ENCOUNTER — Other Ambulatory Visit: Payer: Self-pay | Admitting: Adult Health

## 2021-03-10 NOTE — Telephone Encounter (Signed)
Pt is requesting a refill for amphetamine-dextroamphetamine (ADDERALL) 10 MG tablet .  Pharmacy: [id number]  

## 2021-03-11 MED ORDER — AMPHETAMINE-DEXTROAMPHETAMINE 10 MG PO TABS: 10.0000 mg | ORAL_TABLET | Freq: Every day | ORAL | 0 refills | Status: DC

## 2021-03-18 IMAGING — DX DG HIP (WITH OR WITHOUT PELVIS) 2-3V*R*
3 series · 3 of 3 positions shown · non-contrast
Comparison: None.

CLINICAL DATA: Status post recent fall.

EXAM:
DG HIP (WITH OR WITHOUT PELVIS) 2-3V RIGHT

[pelvis ap]
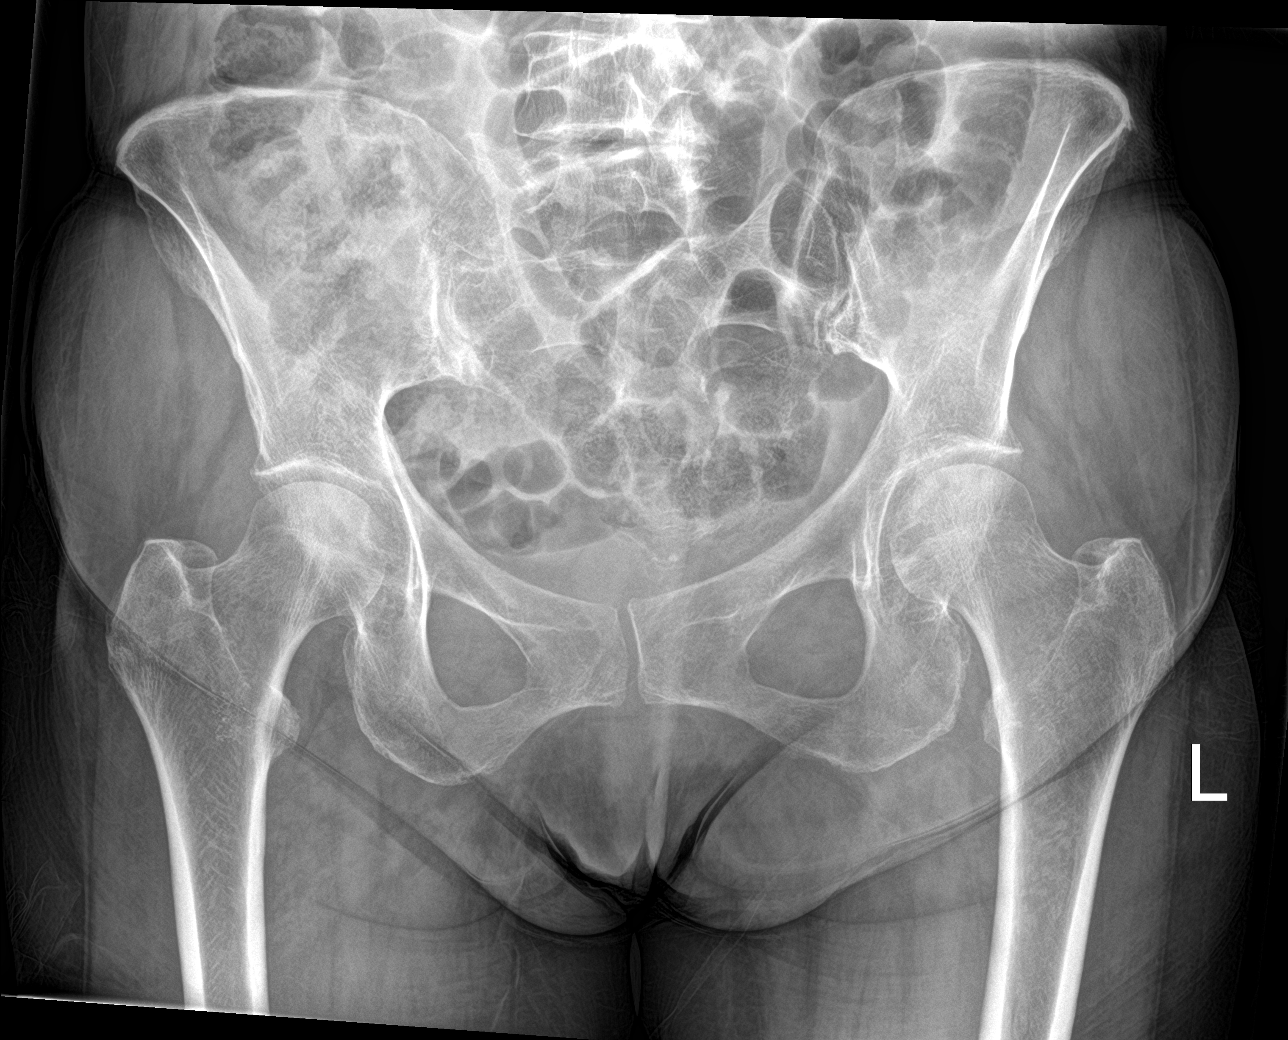

[hip ap]
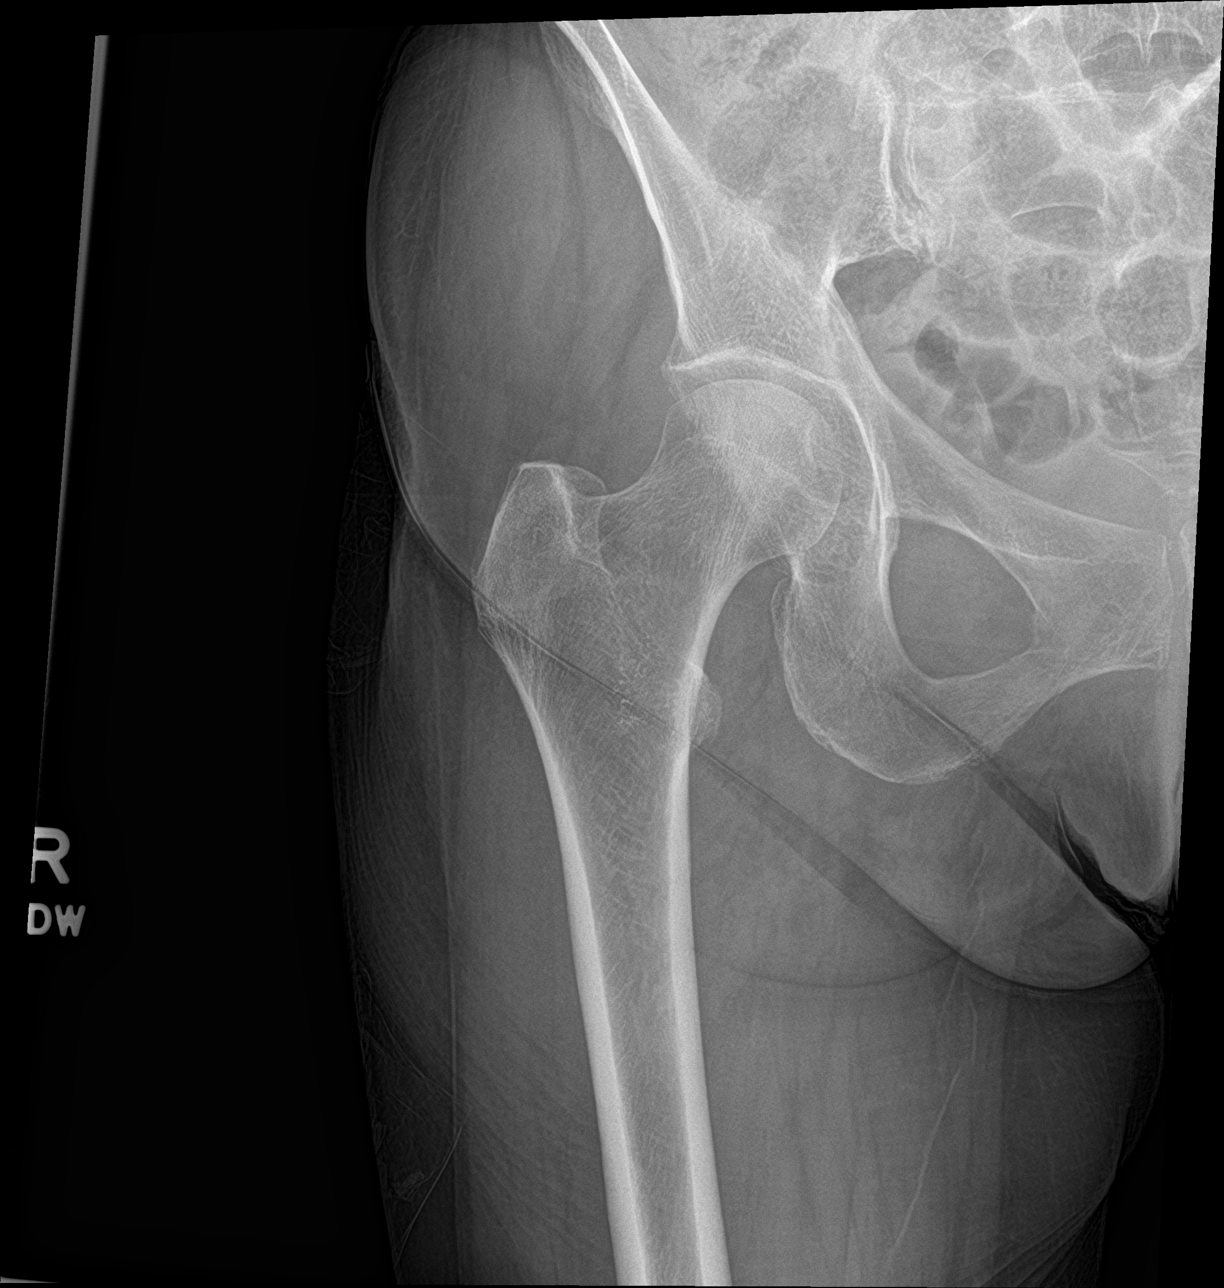

[hip lat]
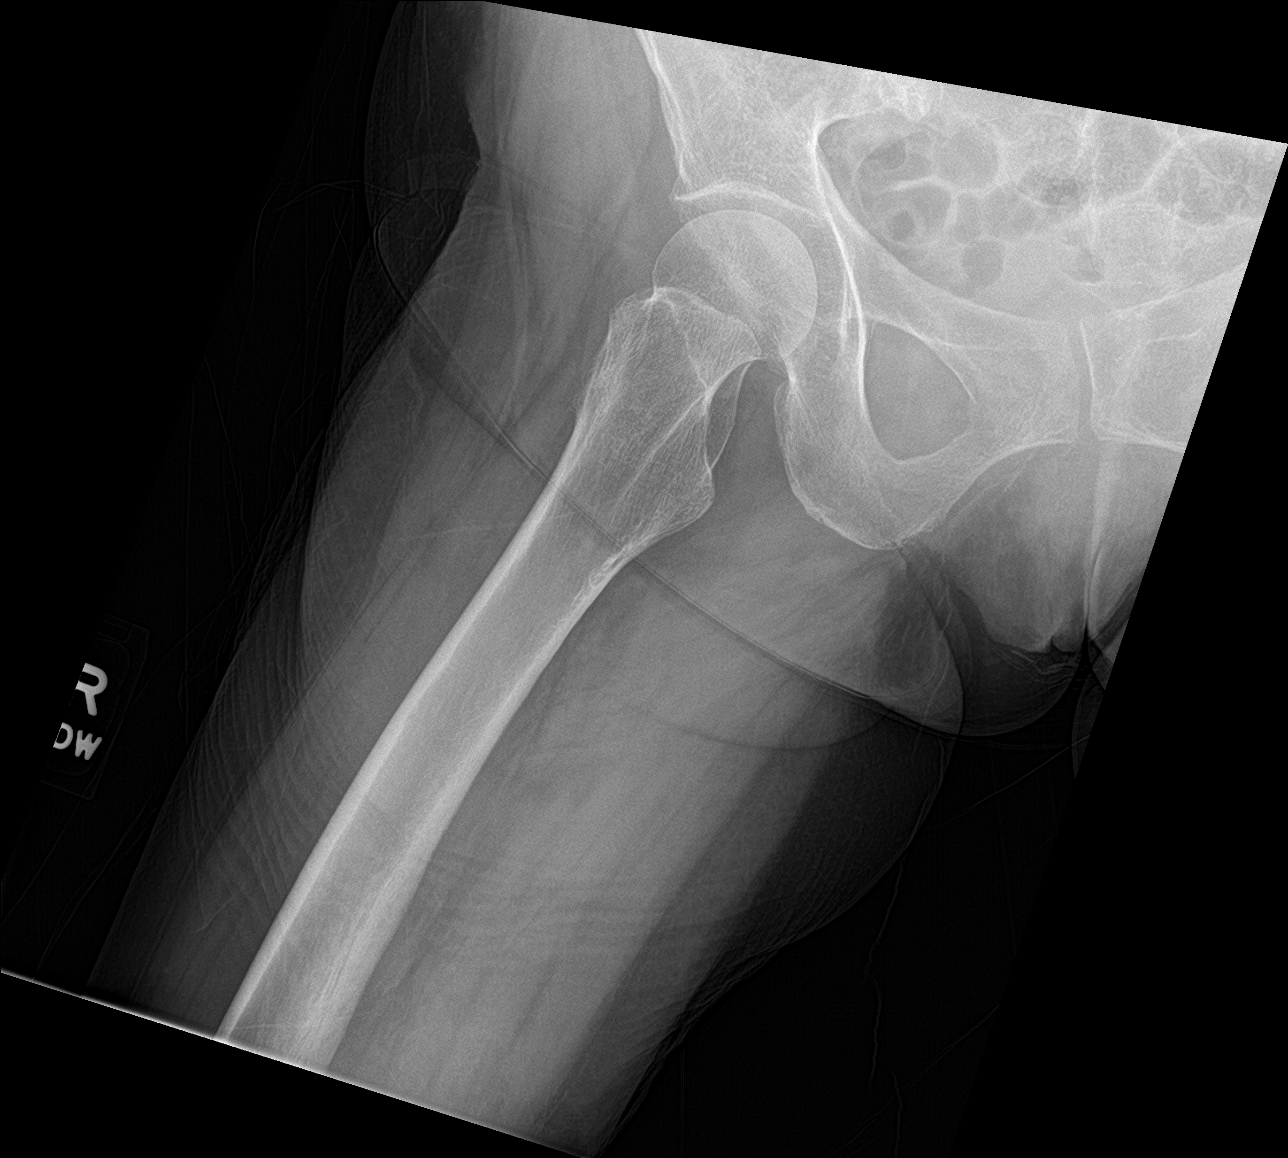

[3 of 3 positions shown; findings below may reference images not displayed]

FINDINGS: There is no evidence of hip fracture or dislocation. There is no
evidence of arthropathy or other focal bone abnormality.
IMPRESSION: Negative.

## 2021-03-18 IMAGING — DX DG LUMBAR SPINE COMPLETE 4+V
5 series · 5 of 5 positions shown · non-contrast
Comparison: None.

CLINICAL DATA: Status post recent fall.

EXAM:
LUMBAR SPINE - COMPLETE 4+ VIEW

[l-spine ap]
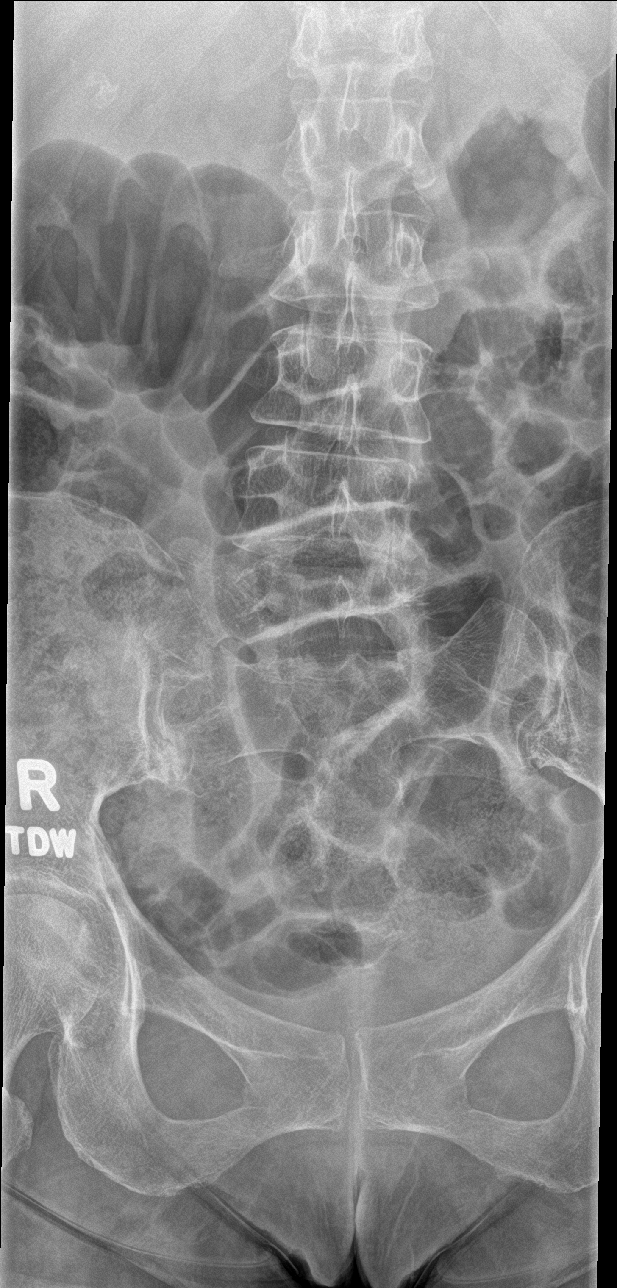

[l-spine obl (1 of 2)]
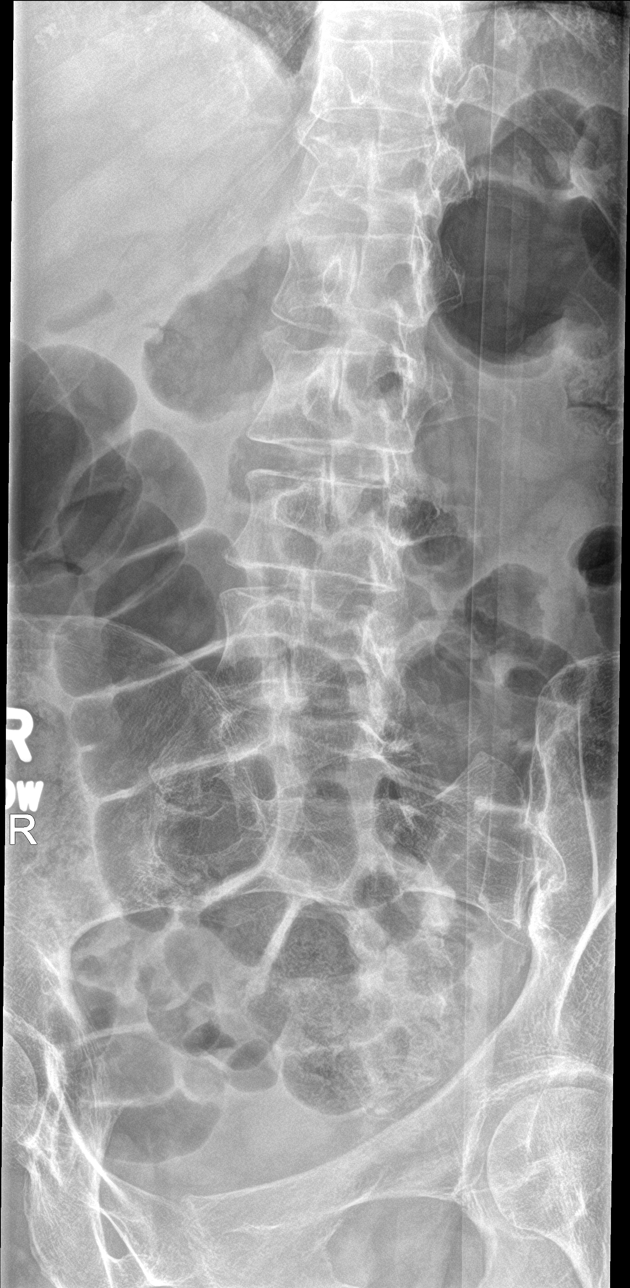

[l-spine obl (2 of 2)]
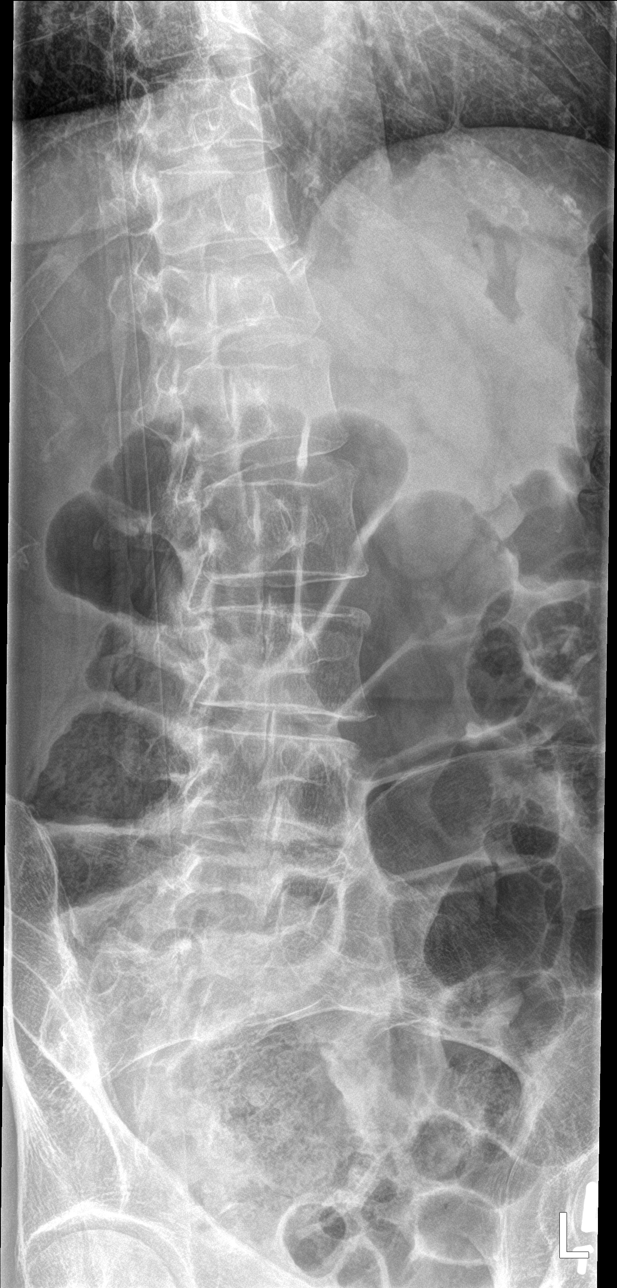

[l-spine lat]
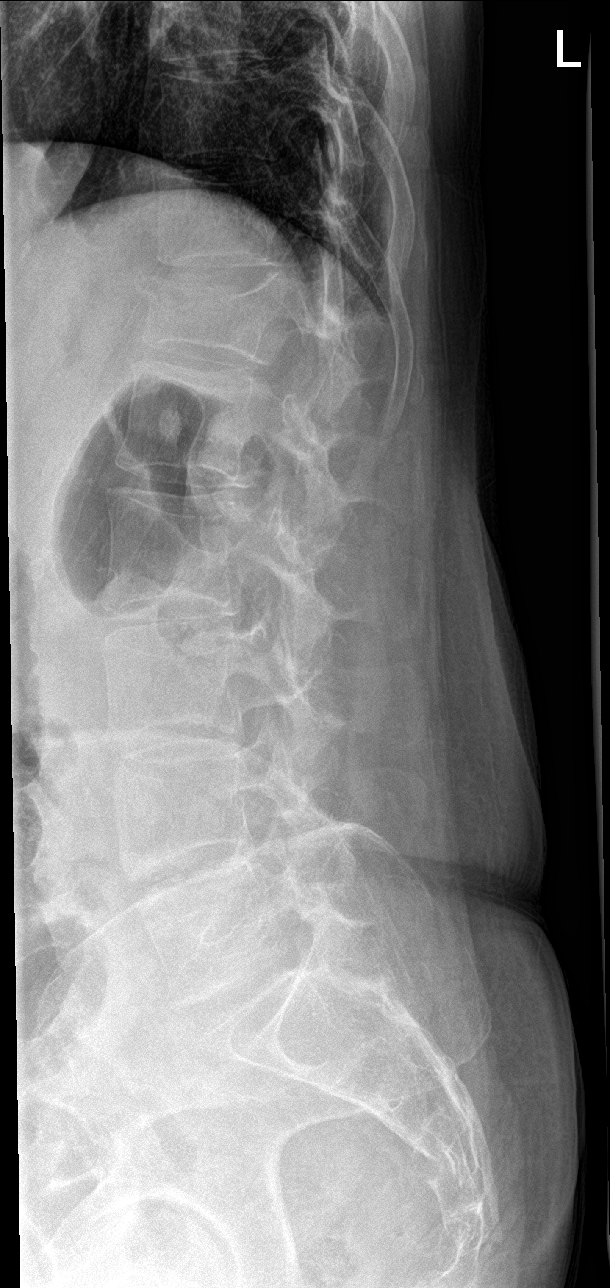

[l-spine spot]
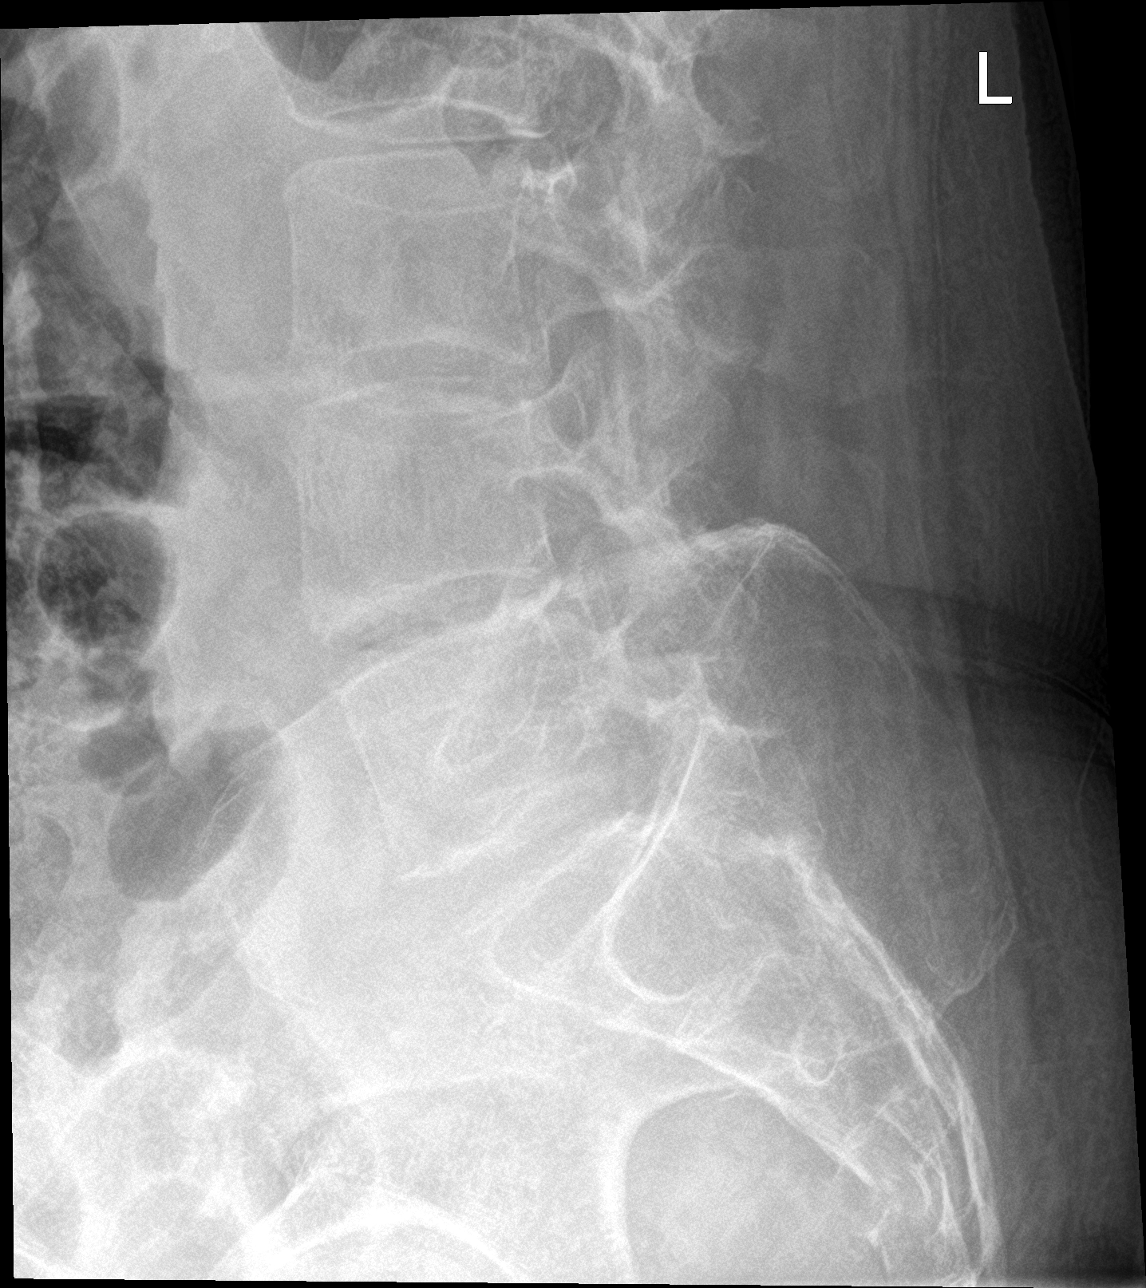

[5 of 5 positions shown; findings below may reference images not displayed]

FINDINGS: There is no evidence of acute lumbar spine fracture. A chronic
compression fracture deformity of the T12 vertebral body is seen.
Alignment is normal. Mild endplate sclerosis is seen at the levels
of L4-L5 and L5-S1. Intervertebral disc spaces are maintained.
IMPRESSION: Chronic compression fracture deformity of the T12 vertebral body.

## 2021-03-25 DIAGNOSIS — N952 Postmenopausal atrophic vaginitis: Secondary | ICD-10-CM | POA: Diagnosis not present

## 2021-04-10 ENCOUNTER — Ambulatory Visit: Payer: Medicare Other | Admitting: Adult Health

## 2021-04-13 ENCOUNTER — Encounter: Payer: Self-pay | Admitting: Adult Health

## 2021-04-13 ENCOUNTER — Ambulatory Visit: Payer: Medicare Other | Admitting: Adult Health

## 2021-04-13 DIAGNOSIS — Z20822 Contact with and (suspected) exposure to covid-19: Secondary | ICD-10-CM | POA: Diagnosis not present

## 2021-04-16 ENCOUNTER — Ambulatory Visit: Payer: Medicare Other | Admitting: Adult Health

## 2021-04-16 ENCOUNTER — Telehealth: Payer: Self-pay | Admitting: Adult Health

## 2021-04-16 NOTE — Telephone Encounter (Signed)
Pt cancelling appt due getting over Covid. ?

## 2021-04-30 ENCOUNTER — Encounter: Payer: Self-pay | Admitting: Adult Health

## 2021-04-30 ENCOUNTER — Other Ambulatory Visit: Payer: Self-pay | Admitting: *Deleted

## 2021-04-30 ENCOUNTER — Ambulatory Visit (INDEPENDENT_AMBULATORY_CARE_PROVIDER_SITE_OTHER): Payer: Medicare Other | Admitting: Adult Health

## 2021-04-30 VITALS — BP 123/78 | HR 80 | Ht 66.0 in | Wt 115.0 lb

## 2021-04-30 DIAGNOSIS — G4710 Hypersomnia, unspecified: Secondary | ICD-10-CM

## 2021-04-30 DIAGNOSIS — G4733 Obstructive sleep apnea (adult) (pediatric): Secondary | ICD-10-CM | POA: Diagnosis not present

## 2021-04-30 DIAGNOSIS — R0902 Hypoxemia: Secondary | ICD-10-CM

## 2021-04-30 MED ORDER — AMPHETAMINE-DEXTROAMPHETAMINE 10 MG PO TABS: 10.0000 mg | ORAL_TABLET | Freq: Every day | ORAL | 0 refills | Status: DC

## 2021-04-30 NOTE — Progress Notes (Signed)
Order sent to [facility] for ONO.  ?
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
## 2021-05-13 DIAGNOSIS — M4722 Other spondylosis with radiculopathy, cervical region: Secondary | ICD-10-CM | POA: Diagnosis not present

## 2021-05-13 DIAGNOSIS — E538 Deficiency of other specified B group vitamins: Secondary | ICD-10-CM | POA: Diagnosis not present

## 2021-05-13 DIAGNOSIS — M810 Age-related osteoporosis without current pathological fracture: Secondary | ICD-10-CM | POA: Diagnosis not present

## 2021-05-13 DIAGNOSIS — G4733 Obstructive sleep apnea (adult) (pediatric): Secondary | ICD-10-CM | POA: Diagnosis not present

## 2021-05-13 DIAGNOSIS — E039 Hypothyroidism, unspecified: Secondary | ICD-10-CM | POA: Diagnosis not present

## 2021-05-13 DIAGNOSIS — Z0000 Encounter for general adult medical examination without abnormal findings: Secondary | ICD-10-CM | POA: Diagnosis not present

## 2021-05-13 DIAGNOSIS — I5032 Chronic diastolic (congestive) heart failure: Secondary | ICD-10-CM | POA: Diagnosis not present

## 2021-05-13 DIAGNOSIS — Z23 Encounter for immunization: Secondary | ICD-10-CM | POA: Diagnosis not present

## 2021-05-13 DIAGNOSIS — I129 Hypertensive chronic kidney disease with stage 1 through stage 4 chronic kidney disease, or unspecified chronic kidney disease: Secondary | ICD-10-CM | POA: Diagnosis not present

## 2021-05-16 DIAGNOSIS — Z20822 Contact with and (suspected) exposure to covid-19: Secondary | ICD-10-CM | POA: Diagnosis not present

## 2021-05-28 ENCOUNTER — Other Ambulatory Visit: Payer: Self-pay | Admitting: Adult Health

## 2021-05-28 MED ORDER — AMPHETAMINE-DEXTROAMPHETAMINE 10 MG PO TABS: 10.0000 mg | ORAL_TABLET | Freq: Every day | ORAL | 0 refills | Status: DC

## 2021-05-28 NOTE — Telephone Encounter (Signed)
Pt is requesting a refill for amphetamine-dextroamphetamine (ADDERALL) 10 MG tablet .  Pharmacy: [id number]  

## 2021-05-28 NOTE — Telephone Encounter (Signed)
Last visit: 04/30/2021 Next visit: 07/20/2021 Per [place] registry, last filled on 04/30/2021 #60/30.

## 2021-05-28 NOTE — Addendum Note (Signed)
Addended by: Gildardo Griffes on: 05/28/2021 01:45 PM   Modules accepted: Orders

## 2021-06-19 IMAGING — RF DG C-ARM 1-60 MIN
2 series · 4 of 4 positions shown · non-contrast
Comparison: None.

CLINICAL DATA: T12 kyphoplasty.  T12 fracture.

EXAM:
THORACOLUMBAR SPINE 1V

[Series 1: run · 2 of 2 slices shown (1 of 2)]
[im 1/2]
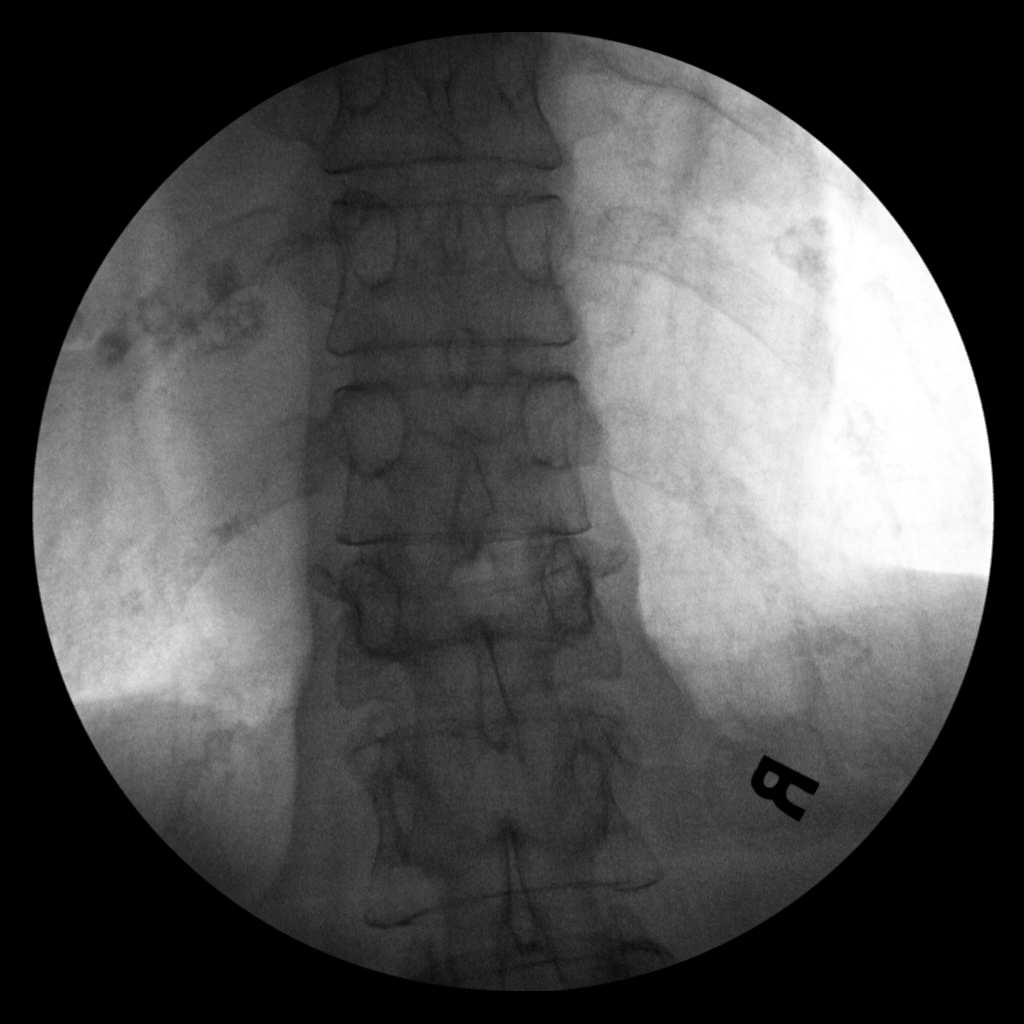
[im 2/2]
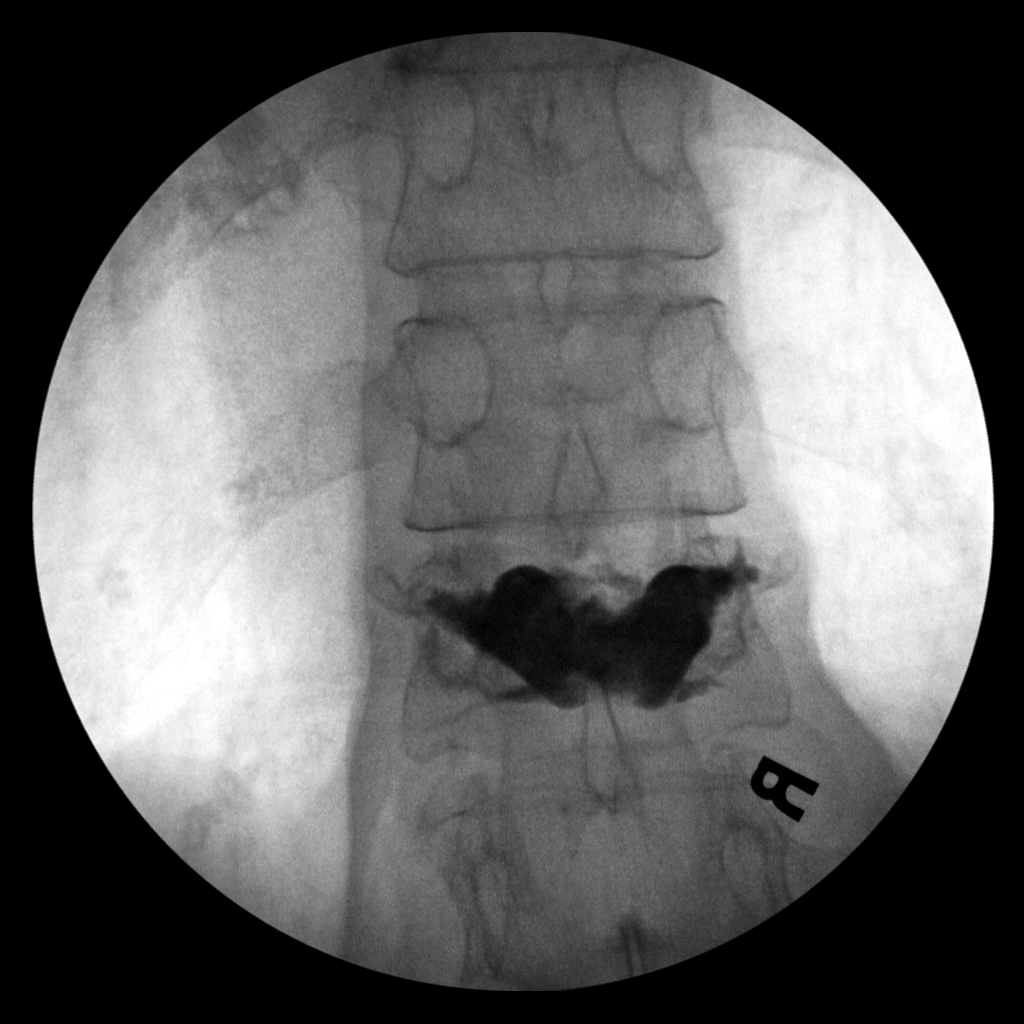

[Series 1: run · 2 of 2 slices shown (2 of 2)]
[im 1/2]
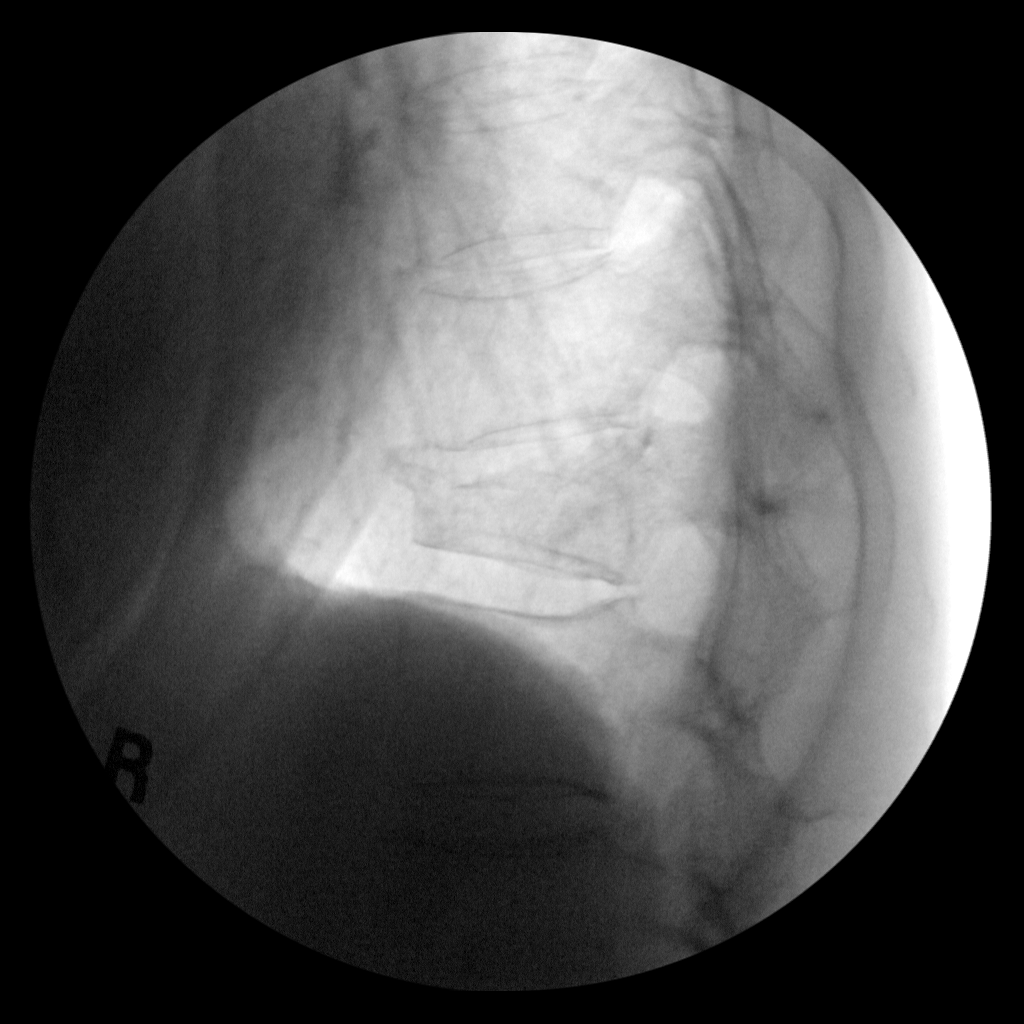
[im 2/2]
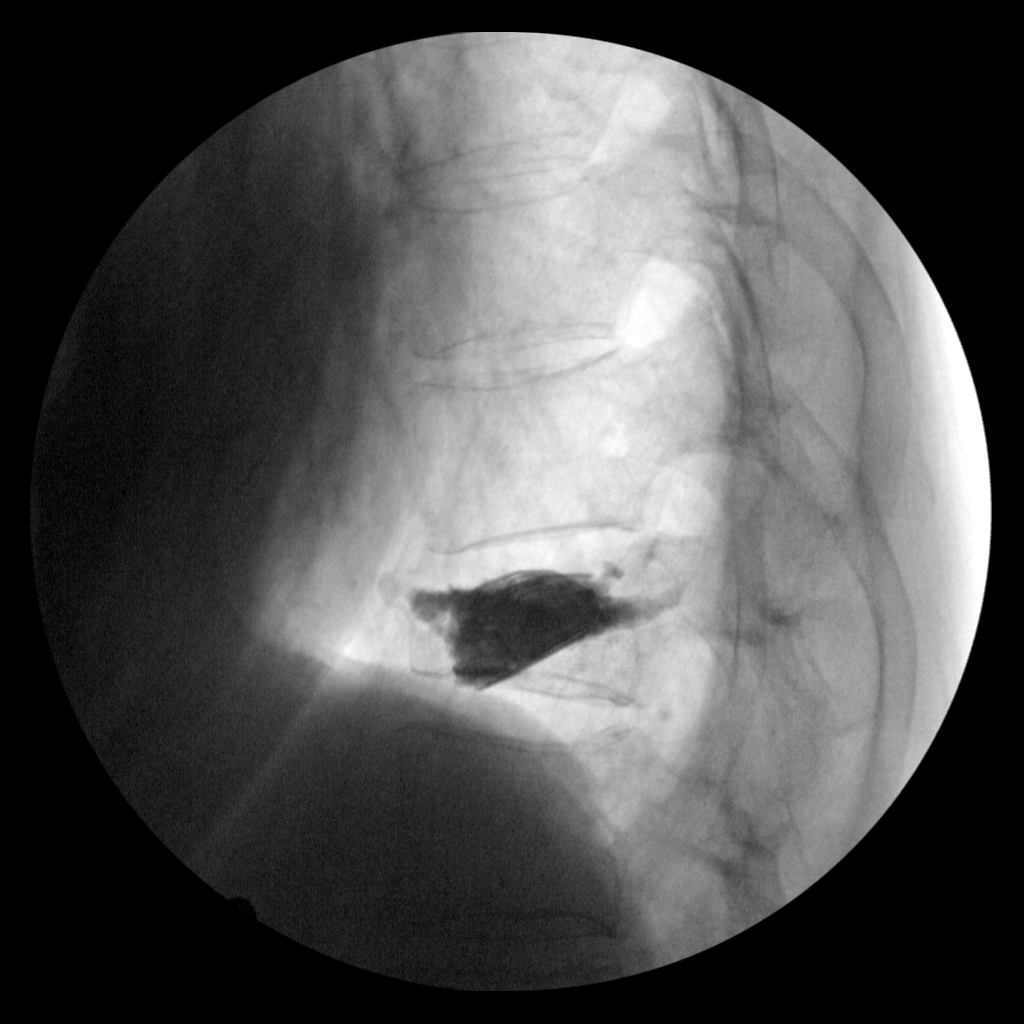

[4 of 4 positions shown; findings below may reference images not displayed]

FINDINGS: Intraoperative images are submitted. Pre and post vertebral
augmentation images demonstrate methylmethacrylate contained within
the fractured T12 vertebral body. There may be some elevation of the
vertebral body height following the procedure. The T11-12 disc space
is preserved.
IMPRESSION: 1. Post vertebral augmentation of T12 without radiographic evidence
for complication.
2. Possible elevation of vertebral body height following the
procedure.

## 2021-06-19 IMAGING — RF DG THORACOLUMBAR SPINE 2V
1 series · 2 of 2 positions shown · non-contrast
Comparison: None.

CLINICAL DATA: T12 kyphoplasty.  T12 fracture.

EXAM:
THORACOLUMBAR SPINE 1V

[Series 1: run · 2 of 2 slices shown]
[im 1/2]
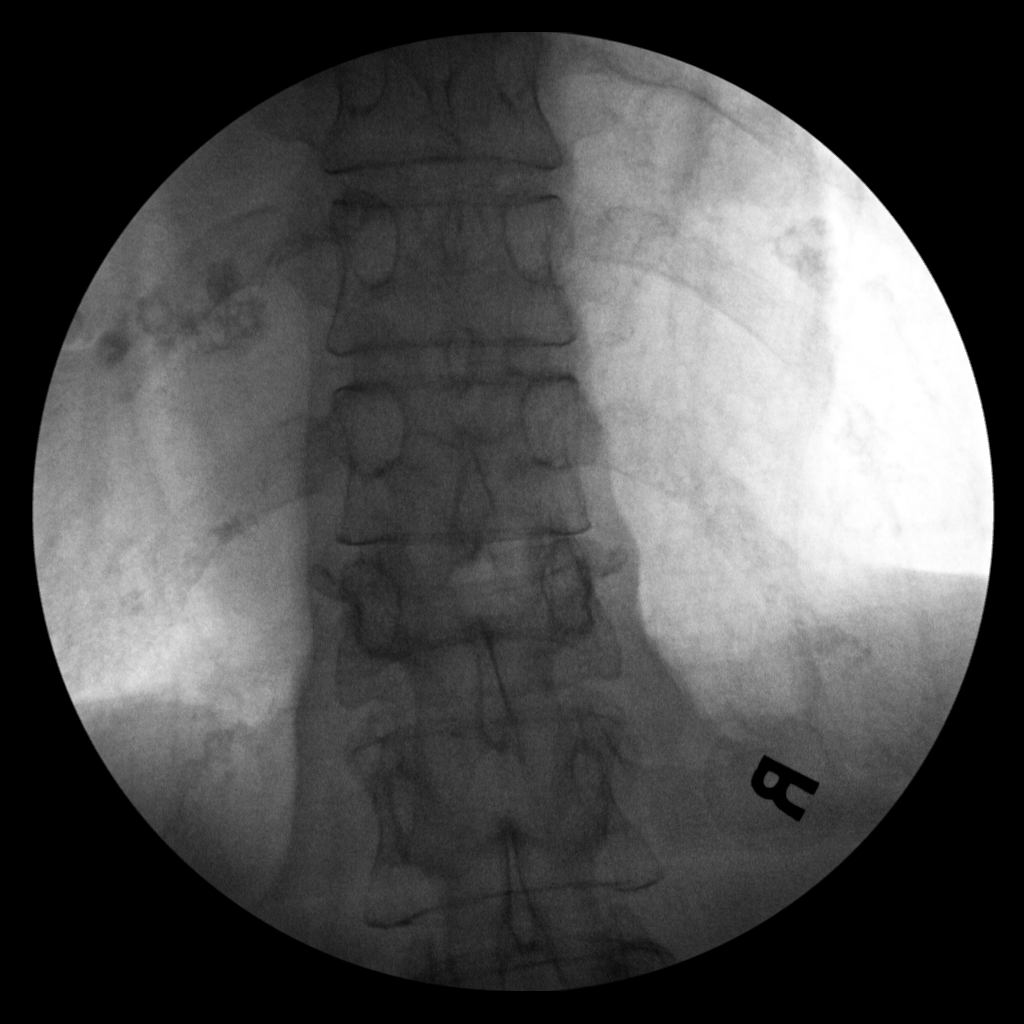
[im 2/2]
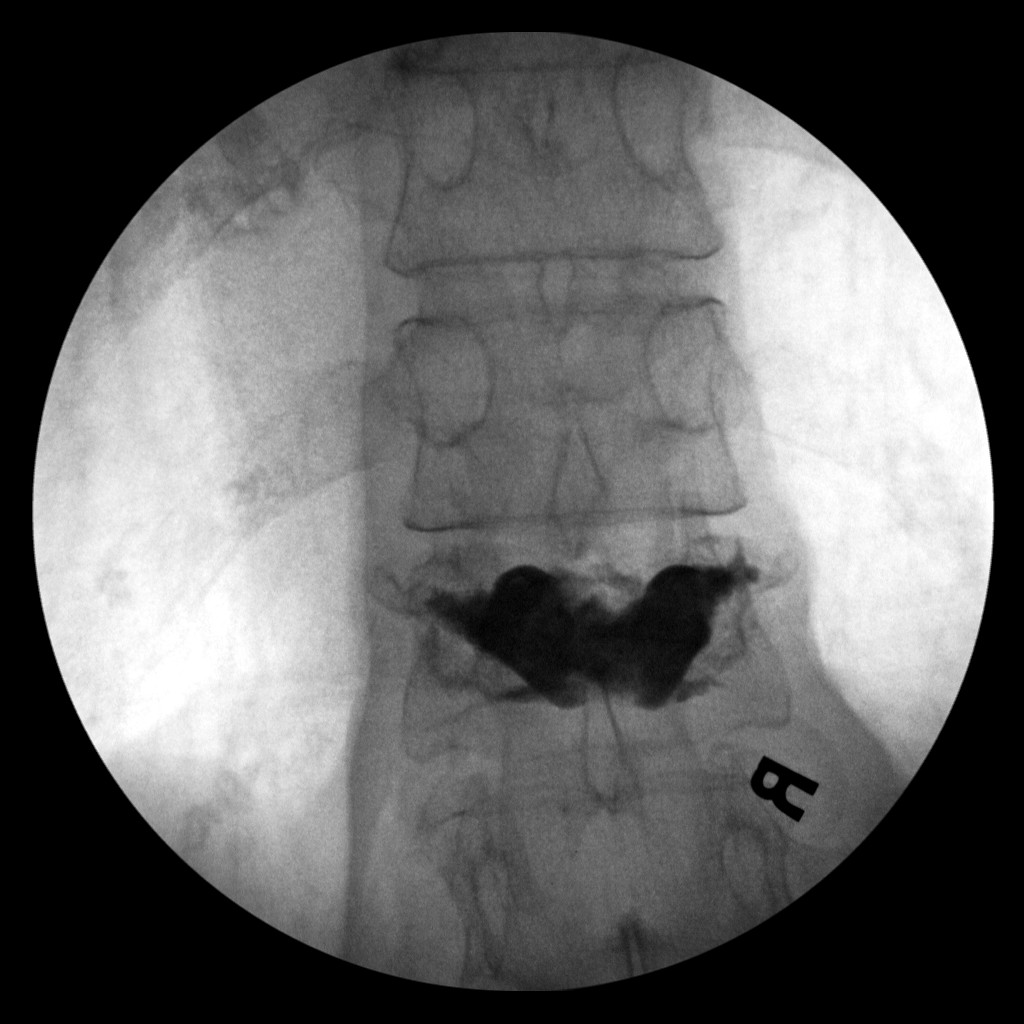

[2 of 2 positions shown; findings below may reference images not displayed]

FINDINGS: Intraoperative images are submitted. Pre and post vertebral
augmentation images demonstrate methylmethacrylate contained within
the fractured T12 vertebral body. There may be some elevation of the
vertebral body height following the procedure. The T11-12 disc space
is preserved.
IMPRESSION: 1. Post vertebral augmentation of T12 without radiographic evidence
for complication.
2. Possible elevation of vertebral body height following the
procedure.

## 2021-06-24 DIAGNOSIS — L089 Local infection of the skin and subcutaneous tissue, unspecified: Secondary | ICD-10-CM | POA: Diagnosis not present

## 2021-06-24 DIAGNOSIS — I10 Essential (primary) hypertension: Secondary | ICD-10-CM | POA: Diagnosis not present

## 2021-07-06 ENCOUNTER — Other Ambulatory Visit: Payer: Self-pay | Admitting: Adult Health

## 2021-07-06 MED ORDER — AMPHETAMINE-DEXTROAMPHETAMINE 10 MG PO TABS: 10.0000 mg | ORAL_TABLET | Freq: Every day | ORAL | 0 refills | Status: DC

## 2021-07-20 ENCOUNTER — Ambulatory Visit: Payer: Medicare Other | Admitting: Adult Health

## 2021-07-22 DIAGNOSIS — G4730 Sleep apnea, unspecified: Secondary | ICD-10-CM | POA: Diagnosis not present

## 2021-07-22 DIAGNOSIS — R0683 Snoring: Secondary | ICD-10-CM | POA: Diagnosis not present

## 2021-08-06 ENCOUNTER — Telehealth: Payer: Self-pay | Admitting: *Deleted

## 2021-08-06 ENCOUNTER — Other Ambulatory Visit: Payer: Self-pay | Admitting: Adult Health

## 2021-08-06 DIAGNOSIS — G4733 Obstructive sleep apnea (adult) (pediatric): Secondary | ICD-10-CM

## 2021-08-06 DIAGNOSIS — R0902 Hypoxemia: Secondary | ICD-10-CM

## 2021-08-06 MED ORDER — AMPHETAMINE-DEXTROAMPHETAMINE 10 MG PO TABS: 10.0000 mg | ORAL_TABLET | Freq: Every day | ORAL | 0 refills | Status: DC

## 2021-08-06 NOTE — Addendum Note (Signed)
Addended by: Gildardo Griffes on: 08/06/2021 10:44 AM   Modules accepted: Orders

## 2021-08-06 NOTE — Telephone Encounter (Signed)
Received this in a secure message from [employer]:   Test Date:07/22/21   Duration:[id number]   Lowest SpO2%:73   Time below 89%:   Results sent to: MD and Rep    I have asked for the report to be faxed to [phone]

## 2021-08-06 NOTE — Addendum Note (Signed)
Addended by: Larey Seat on: 08/06/2021 12:57 PM   Modules accepted: Orders

## 2021-08-06 NOTE — Telephone Encounter (Signed)
Last visit: 04/30/21 Next visit: not scheduled yet Per [place] registry, last filled #60/30 on 07/06/2021.   Rx refill sent to Dr Brett Fairy.

## 2021-08-06 NOTE — Telephone Encounter (Signed)
Pt is calling and requesting a refill on amphetamine-dextroamphetamine (ADDERALL) 10 MG tablet.Pt is requesting prescription be sent to [facility], [place] [address]

## 2021-08-12 NOTE — Telephone Encounter (Signed)
Jinny Blossom, NP has reviewed the results of ONO. Recommendation is to do CPAP titration study due to oxygen de sat's. Pt is not a candidate for inspire due low de sat reading. O2 could be dropping because cpap is needed. We cannot order O2 only because we do not manage O2 only.  CPAP titration study is reccommended with the goal in mind for patient to resume CPAP therapy. If pt is interested in treating with CPAP, titration study can be ordered if not interested we do not have to order.  I called pt. No answer, left a message asking pt to call me back.

## 2021-08-13 NOTE — Addendum Note (Signed)
Addended by: Verlin Grills on: 08/13/2021 04:04 PM   Modules accepted: Orders

## 2021-08-13 NOTE — Telephone Encounter (Signed)
Pt returned my call and we had an extended conversation.   She verbalized understanding and appreciation for the call. She is amenable to titration study and I have placed the order. She will call back in the next few weeks if she has not heard about scheduling this appt.

## 2021-08-19 NOTE — Telephone Encounter (Signed)
Order placed for the pt.

## 2021-08-19 NOTE — Addendum Note (Signed)
Addended by: Darleen Crocker on: 08/19/2021 03:47 PM   Modules accepted: Orders

## 2021-08-20 NOTE — Telephone Encounter (Signed)
split with AHI of 10- Medicare no auth req.  Patient is scheduled at [facility] for 09/16/21 at 8 pm.  Mailed packet to the patient.

## 2021-09-07 ENCOUNTER — Other Ambulatory Visit: Payer: Self-pay | Admitting: Adult Health

## 2021-09-07 NOTE — Telephone Encounter (Signed)
Pt request refill foramphetamine-dextroamphetamine (ADDERALL) 10 MG tablet at [facility] #[id number]

## 2021-09-07 NOTE — Telephone Encounter (Signed)
Last visit: 04/30/21 Next visit: 09/16/21 Per [place] registry, last filled on 08/06/2021 #60/30. Rx refill sent to [facility].

## 2021-09-08 MED ORDER — AMPHETAMINE-DEXTROAMPHETAMINE 10 MG PO TABS
10.0000 mg | ORAL_TABLET | Freq: Every day | ORAL | 0 refills | Status: DC
Start: 2021-09-08 — End: 2021-10-15

## 2021-09-15 NOTE — Telephone Encounter (Signed)
LVM for pt to call back to see if she meant to cancel her 09/16/21 appt

## 2021-10-02 DIAGNOSIS — Z23 Encounter for immunization: Secondary | ICD-10-CM | POA: Diagnosis not present

## 2021-10-15 ENCOUNTER — Other Ambulatory Visit: Payer: Self-pay | Admitting: Adult Health

## 2021-10-15 MED ORDER — AMPHETAMINE-DEXTROAMPHETAMINE 10 MG PO TABS
10.0000 mg | ORAL_TABLET | Freq: Every day | ORAL | 0 refills | Status: DC
Start: 2021-10-15 — End: 2021-12-16

## 2021-10-15 NOTE — Telephone Encounter (Signed)
Last seen 04-2021, no f/u scheduled.

## 2021-10-15 NOTE — Telephone Encounter (Signed)
Pt is requesting a refill for amphetamine-dextroamphetamine (ADDERALL) 10 MG tablet .  Pharmacy: [place] [facility] [phone]

## 2021-11-11 DIAGNOSIS — R070 Pain in throat: Secondary | ICD-10-CM | POA: Diagnosis not present

## 2021-11-11 DIAGNOSIS — J020 Streptococcal pharyngitis: Secondary | ICD-10-CM | POA: Diagnosis not present

## 2021-11-11 DIAGNOSIS — R509 Fever, unspecified: Secondary | ICD-10-CM | POA: Diagnosis not present

## 2021-12-16 ENCOUNTER — Other Ambulatory Visit: Payer: Self-pay | Admitting: Adult Health

## 2021-12-16 NOTE — Telephone Encounter (Signed)
Pt is calling. Requesting refill on medication  amphetamine-dextroamphetamine (ADDERALL) 10 MG tablet . Refill should be sent to [facility] #[id number]

## 2021-12-16 NOTE — Addendum Note (Signed)
Addended by: Skeet Simmer A on: 12/16/2021 01:45 PM   Modules accepted: Orders

## 2021-12-17 MED ORDER — AMPHETAMINE-DEXTROAMPHETAMINE 10 MG PO TABS: 10.0000 mg | ORAL_TABLET | Freq: Every day | ORAL | 0 refills | Status: DC

## 2021-12-31 DIAGNOSIS — H5713 Ocular pain, bilateral: Secondary | ICD-10-CM | POA: Diagnosis not present

## 2022-01-09 DIAGNOSIS — H1033 Unspecified acute conjunctivitis, bilateral: Secondary | ICD-10-CM | POA: Diagnosis not present

## 2022-01-27 DIAGNOSIS — I8312 Varicose veins of left lower extremity with inflammation: Secondary | ICD-10-CM | POA: Diagnosis not present

## 2022-01-27 DIAGNOSIS — I8311 Varicose veins of right lower extremity with inflammation: Secondary | ICD-10-CM | POA: Diagnosis not present

## 2022-01-27 DIAGNOSIS — I872 Venous insufficiency (chronic) (peripheral): Secondary | ICD-10-CM | POA: Diagnosis not present

## 2022-01-27 DIAGNOSIS — Z85828 Personal history of other malignant neoplasm of skin: Secondary | ICD-10-CM | POA: Diagnosis not present

## 2022-01-27 DIAGNOSIS — D485 Neoplasm of uncertain behavior of skin: Secondary | ICD-10-CM | POA: Diagnosis not present

## 2022-01-27 DIAGNOSIS — L308 Other specified dermatitis: Secondary | ICD-10-CM | POA: Diagnosis not present

## 2022-01-27 DIAGNOSIS — C44629 Squamous cell carcinoma of skin of left upper limb, including shoulder: Secondary | ICD-10-CM | POA: Diagnosis not present

## 2022-02-25 DIAGNOSIS — C44722 Squamous cell carcinoma of skin of right lower limb, including hip: Secondary | ICD-10-CM | POA: Diagnosis not present

## 2022-02-25 DIAGNOSIS — D485 Neoplasm of uncertain behavior of skin: Secondary | ICD-10-CM | POA: Diagnosis not present

## 2022-02-25 DIAGNOSIS — Z85828 Personal history of other malignant neoplasm of skin: Secondary | ICD-10-CM | POA: Diagnosis not present

## 2022-02-25 DIAGNOSIS — L570 Actinic keratosis: Secondary | ICD-10-CM | POA: Diagnosis not present

## 2022-03-01 DIAGNOSIS — H52203 Unspecified astigmatism, bilateral: Secondary | ICD-10-CM | POA: Diagnosis not present

## 2022-03-01 DIAGNOSIS — H2513 Age-related nuclear cataract, bilateral: Secondary | ICD-10-CM | POA: Diagnosis not present

## 2022-03-11 ENCOUNTER — Ambulatory Visit (INDEPENDENT_AMBULATORY_CARE_PROVIDER_SITE_OTHER): Payer: Medicare Other | Admitting: Physician Assistant

## 2022-03-11 ENCOUNTER — Ambulatory Visit: Payer: Self-pay

## 2022-03-11 ENCOUNTER — Ambulatory Visit (INDEPENDENT_AMBULATORY_CARE_PROVIDER_SITE_OTHER): Payer: Medicare Other | Admitting: Sports Medicine

## 2022-03-11 DIAGNOSIS — M25511 Pain in right shoulder: Secondary | ICD-10-CM

## 2022-03-11 DIAGNOSIS — M19011 Primary osteoarthritis, right shoulder: Secondary | ICD-10-CM | POA: Diagnosis not present

## 2022-03-11 DIAGNOSIS — G8929 Other chronic pain: Secondary | ICD-10-CM

## 2022-03-11 MED ORDER — LIDOCAINE HCL 1 % IJ SOLN
2.0000 mL | INTRAMUSCULAR | Status: AC | PRN
  Administered 2022-03-11: 2 mL

## 2022-03-11 MED ORDER — METHYLPREDNISOLONE ACETATE 40 MG/ML IJ SUSP
80.0000 mg | INTRAMUSCULAR | Status: AC | PRN
  Administered 2022-03-11: 80 mg via INTRA_ARTICULAR

## 2022-03-11 MED ORDER — BUPIVACAINE HCL 0.25 % IJ SOLN
2.0000 mL | INTRAMUSCULAR | Status: AC | PRN
  Administered 2022-03-11: 2 mL via INTRA_ARTICULAR

## 2022-03-11 NOTE — Progress Notes (Signed)
   Procedure Note  Patient: Angela [name]             Date of Birth: [birth year]           MRN: [id number]             Visit Date: 03/11/2022  Procedures: Visit Diagnoses:  1. Chronic right shoulder pain   2. Primary osteoarthritis, right shoulder    Large Joint Inj: L glenohumeral on 03/11/2022 2:01 PM Indications: pain Details: 22 G 3.5 in needle, ultrasound-guided posterior approach Medications: 2 mL lidocaine 1 %; 2 mL bupivacaine 0.25 %; 80 mg methylPREDNISolone acetate 40 MG/ML Outcome: tolerated well, no immediate complications Procedure, treatment alternatives, risks and benefits explained, specific risks discussed. Consent was given by the patient. Immediately prior to procedure a time out was called to verify the correct patient, procedure, equipment, support staff and site/side marked as required. Patient was prepped and draped in the usual sterile fashion.     - I evaluated the patient about 10 minutes post-injection and she had great improvement in pain and range of motion - follow-up with Mary-Anne as indicated; I am happy to see her as needed  Elba Barman, DO [place]  This note was dictated using Dragon naturally speaking software and may contain errors in syntax, spelling, or content which have not been identified prior to signing this note.

## 2022-03-12 ENCOUNTER — Encounter: Payer: Self-pay | Admitting: Physician Assistant

## 2022-03-12 NOTE — Progress Notes (Signed)
Office Visit Note   Patient: Angela [name]           Date of Birth: 01/12/1945           MRN: [id number] Visit Date: 03/11/2022              Requested by: Janie Morning, DO [address] [place],  [place] [address] PCP: Janie Morning, DO  Chief Complaint  Patient presents with   Right Shoulder - Pain      HPI: Patient is a pleasant 77 year old woman who is a former patient of Dr. Junius Roads.  She has a history of glenohumeral arthritis with previous ultrasound-guided injections which have lasted her quite a long time today comes in requesting an injection  Assessment & Plan: Visit Diagnoses: Osteoarthritis glenohumeral joint  Plan: Patient will be referred today for ultrasound-guided injection to Dr. Rolena Infante.  Can follow-up with him as needed  Follow-Up Instructions: No follow-ups on file.   Ortho Exam  Patient is alert, oriented, no adenopathy, well-dressed, normal affect, normal respiratory effort. Patient appears well she is neurovascularly intact she has pain with external rotation and has some limitations with this.  No swelling no erythema  Imaging: No results found. No images are attached to the encounter.  Labs: Lab Results  Component Value Date   ESRSEDRATE 2 01/19/2016   CRP 1.9 01/19/2016   REPTSTATUS 07/19/2010 FINAL 07/17/2010   REPTSTATUS 07/20/2010 FINAL 07/17/2010   REPTSTATUS 07/22/2010 FINAL 07/17/2010   REPTSTATUS 07/22/2010 FINAL 07/17/2010   GRAMSTAIN  07/17/2010    NO WBC SEEN NO SQUAMOUS EPITHELIAL CELLS SEEN NO ORGANISMS SEEN   GRAMSTAIN  07/17/2010    ABUNDANT WBC PRESENT, PREDOMINANTLY PMN NO SQUAMOUS EPITHELIAL CELLS SEEN NO ORGANISMS SEEN   GRAMSTAIN  07/17/2010    NO WBC SEEN NO SQUAMOUS EPITHELIAL CELLS SEEN NO ORGANISMS SEEN   GRAMSTAIN  07/17/2010    ABUNDANT WBC PRESENT, PREDOMINANTLY PMN NO SQUAMOUS EPITHELIAL CELLS SEEN NO ORGANISMS SEEN   CULT NO GROWTH 2 DAYS 07/17/2010   CULT  07/17/2010    FEW GROUP B  STREP(S.AGALACTIAE)ISOLATED Note: TESTING AGAINST S. AGALACTIAE NOT ROUTINELY PERFORMED DUE TO PREDICTABILITY OF AMP/PEN/VAN SUSCEPTIBILITY.   CULT NO ANAEROBES ISOLATED 07/17/2010   CULT NO ANAEROBES ISOLATED 07/17/2010     Lab Results  Component Value Date   ALBUMIN 4.2 08/03/2019   ALBUMIN 3.3 (L) 03/05/2016   ALBUMIN 3.6 07/17/2010    No results found for: "MG" No results found for: "VD25OH"  No results found for: "PREALBUMIN"    Latest Ref Rng & Units 10/07/2019    9:50 PM 08/03/2019    9:51 AM 05/06/2019    7:30 PM  CBC EXTENDED  WBC 4.0 - 10.5 K/uL 16.6  4.4  7.9   RBC 3.87 - 5.11 MIL/uL 4.20  4.01  3.66   Hemoglobin 12.0 - 15.0 g/dL 13.7  12.6  11.9   HCT 36.0 - 46.0 % 42.3  39.4  36.5   Platelets 150 - 400 K/uL 312  288  234   NEUT# 1.7 - 7.7 K/uL   6.1   Lymph# 0.7 - 4.0 K/uL   1.0      There is no height or weight on file to calculate BMI.  Orders:  No orders of the defined types were placed in this encounter.  No orders of the defined types were placed in this encounter.    Procedures: No procedures performed  Clinical Data: No additional findings.  ROS:  All  other systems negative, except as noted in the HPI. Review of Systems  Objective: Vital Signs: There were no vitals taken for this visit.  Specialty Comments:  No specialty comments available.  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   Chronic pain syndrome 07/25/2019   Irritable bowel syndrome 07/25/2019   Urethral caruncle 07/25/2019   Thyroid dysfunction 07/25/2019   Osteoporosis 07/09/2019   Hyperprolactinemia ([place]) 07/09/2019   Trochanteric bursitis, left hip 06/28/2017   Chronic, continuous use of opioids 10/12/2016   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/12/2016   Other  retention of urine 10/12/2016   Vaginal atrophy 10/12/2016   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   New onset headache 01/19/2016   Excessive sleepiness 01/19/2016   HYPERTENSION, BENIGN 03/10/2009   HYPERTENSION, UNCONTROLLED 01/29/2009   ARM PAIN, LEFT 01/29/2009   Past Medical History:  Diagnosis Date   Cancer ([place])    skin   Chronic pain    Complication of anesthesia    COVID 2023   Difficult intubation    19 jaw/TMJ surgeries with "bone fused to skull" leading to very limited mouth opening   Headache    Hypertension    Hypothyroidism    Joint disorder    Mitral valve prolapse    Pneumonia    PONV (postoperative nausea and vomiting)    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[place])    "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)   Sleep apnea     Family History  Problem Relation Age of Onset   Breast cancer Mother    Rheum arthritis Mother    Aneurysm Father    Heart disease Father    Throat cancer Father    Melanoma Father    Heart disease Brother    Heart disease Brother    Thyroid disease Brother    Post-traumatic stress disorder Brother    Rheum arthritis Brother    Polymyositis Brother    Obesity Brother     Past Surgical History:  Procedure Laterality Date   ABDOMINAL HYSTERECTOMY     Age 65   ANKLE SURGERY     x3   APPENDECTOMY     BIOPSY  10/19/2018   Procedure: BIOPSY;  Surgeon: Ronnette Juniper, MD;  Location: [facility];  Service: Gastroenterology;;   BIOPSY  04/11/2020   Procedure: BIOPSY;  Surgeon: Ronnette Juniper, MD;  [facility];  Service: Gastroenterology;;   COLONOSCOPY WITH PROPOFOL N/A 10/19/2018   Procedure: COLONOSCOPY WITH PROPOFOL;  Surgeon: Ronnette Juniper, MD;  Location: [facility];  Service: Gastroenterology;  Laterality: N/A;   COLONOSCOPY WITH PROPOFOL N/A 04/11/2020   Procedure: COLONOSCOPY WITH PROPOFOL;  Surgeon: Ronnette Juniper, MD;  Location: [facility];  Service: Gastroenterology;  Laterality: N/A;  (difficulty airway)    DILATION AND  CURETTAGE OF UTERUS     x 4   ESOPHAGOGASTRODUODENOSCOPY (EGD) WITH PROPOFOL N/A 10/19/2018   Procedure: ESOPHAGOGASTRODUODENOSCOPY (EGD) WITH PROPOFOL;  Surgeon: Ronnette Juniper, MD;  [facility]: [facility];  Service: Gastroenterology;  Laterality: N/A;   FRACTURE SURGERY     jaw   KNEE SURGERY     x 3   KYPHOPLASTY N/A 08/07/2019   Procedure: Thoracic twelve Vertebral Augmentation with Stenting/SpineJack;  Surgeon: Vallarie Mare, MD;  Location: [facility];  Service: Neurosurgery;  Laterality: N/A;   MANDIBLE FRACTURE SURGERY     x 19   POLYPECTOMY  10/19/2018   Procedure: POLYPECTOMY;  Surgeon: Ronnette Juniper, MD;  Location: [facility];  Service: Gastroenterology;;   POLYPECTOMY  04/11/2020   Procedure: POLYPECTOMY;  Surgeon: Ronnette Juniper, MD;  Location: [facility];  Service: Gastroenterology;;   Social History   Occupational History   Occupation: Part-time Secondary school teacher  Tobacco Use   Smoking status: Former   Smokeless tobacco: Never   Tobacco comments:    Quit January 1980  Vaping Use   Vaping Use: Never used  Substance and Sexual Activity   Alcohol use: Not Currently    Alcohol/week: 0.0 - 5.0 standard drinks of alcohol   Drug use: No   Sexual activity: Not on file

## 2022-03-24 DIAGNOSIS — Z23 Encounter for immunization: Secondary | ICD-10-CM | POA: Diagnosis not present

## 2022-04-01 DIAGNOSIS — D0472 Carcinoma in situ of skin of left lower limb, including hip: Secondary | ICD-10-CM | POA: Diagnosis not present

## 2022-04-01 DIAGNOSIS — L603 Nail dystrophy: Secondary | ICD-10-CM | POA: Diagnosis not present

## 2022-04-01 DIAGNOSIS — L570 Actinic keratosis: Secondary | ICD-10-CM | POA: Diagnosis not present

## 2022-04-01 DIAGNOSIS — D485 Neoplasm of uncertain behavior of skin: Secondary | ICD-10-CM | POA: Diagnosis not present

## 2022-04-01 DIAGNOSIS — Z85828 Personal history of other malignant neoplasm of skin: Secondary | ICD-10-CM | POA: Diagnosis not present

## 2022-05-03 DIAGNOSIS — L570 Actinic keratosis: Secondary | ICD-10-CM | POA: Diagnosis not present

## 2022-05-03 DIAGNOSIS — C44722 Squamous cell carcinoma of skin of right lower limb, including hip: Secondary | ICD-10-CM | POA: Diagnosis not present

## 2022-05-03 DIAGNOSIS — Z85828 Personal history of other malignant neoplasm of skin: Secondary | ICD-10-CM | POA: Diagnosis not present

## 2022-05-03 DIAGNOSIS — D485 Neoplasm of uncertain behavior of skin: Secondary | ICD-10-CM | POA: Diagnosis not present

## 2022-05-03 DIAGNOSIS — C44619 Basal cell carcinoma of skin of left upper limb, including shoulder: Secondary | ICD-10-CM | POA: Diagnosis not present

## 2022-05-06 DIAGNOSIS — G47419 Narcolepsy without cataplexy: Secondary | ICD-10-CM | POA: Diagnosis not present

## 2022-05-06 DIAGNOSIS — F909 Attention-deficit hyperactivity disorder, unspecified type: Secondary | ICD-10-CM | POA: Diagnosis not present

## 2022-05-07 ENCOUNTER — Ambulatory Visit (INDEPENDENT_AMBULATORY_CARE_PROVIDER_SITE_OTHER): Payer: Medicare Other | Admitting: Sports Medicine

## 2022-05-07 ENCOUNTER — Other Ambulatory Visit (INDEPENDENT_AMBULATORY_CARE_PROVIDER_SITE_OTHER): Payer: Medicare Other

## 2022-05-07 ENCOUNTER — Encounter: Payer: Self-pay | Admitting: Sports Medicine

## 2022-05-07 DIAGNOSIS — M25511 Pain in right shoulder: Secondary | ICD-10-CM | POA: Diagnosis not present

## 2022-05-07 DIAGNOSIS — M4722 Other spondylosis with radiculopathy, cervical region: Secondary | ICD-10-CM | POA: Diagnosis not present

## 2022-05-07 DIAGNOSIS — G8929 Other chronic pain: Secondary | ICD-10-CM | POA: Diagnosis not present

## 2022-05-07 DIAGNOSIS — M19011 Primary osteoarthritis, right shoulder: Secondary | ICD-10-CM

## 2022-05-07 DIAGNOSIS — M5030 Other cervical disc degeneration, unspecified cervical region: Secondary | ICD-10-CM | POA: Diagnosis not present

## 2022-05-07 MED ORDER — METHYLPREDNISOLONE ACETATE 40 MG/ML IJ SUSP
40.0000 mg | INTRAMUSCULAR | Status: AC | PRN
  Administered 2022-05-07: 40 mg via INTRA_ARTICULAR

## 2022-05-07 MED ORDER — LIDOCAINE HCL 1 % IJ SOLN
2.0000 mL | INTRAMUSCULAR | Status: AC | PRN
  Administered 2022-05-07: 2 mL

## 2022-05-07 MED ORDER — BUPIVACAINE HCL 0.25 % IJ SOLN
2.0000 mL | INTRAMUSCULAR | Status: AC | PRN
  Administered 2022-05-07: 2 mL via INTRA_ARTICULAR

## 2022-05-07 NOTE — Progress Notes (Signed)
Angela [name] - 77 y.o. female MRN [id number]  Date of birth: July 29, 1945  Office Visit Note: Visit Date: 05/07/2022 PCP: Irena Reichmann, DO Referred by: Irena Reichmann, DO  Subjective: Chief Complaint  Patient presents with   Right Shoulder - Pain   HPI: Angela [name] is a pleasant 77 y.o. female who presents today for follow-up of right shoulder pain. Also having RUE pain.  He does have known moderate to severe glenohumeral joint arthritis as well as cervical degenerative disc disease with a previous MRI on 11/16/2020 which showed C3-C4 right disc bulge as well as multilevel cervical spondylosis from C4-C7.  I performed US-guided L-GHJ injection on 03/11/22 - she did not find this to be very beneficial, only had about 20-25% of relief for the first few weeks.  She continues with pain in the right shoulder.  She does get some popping or grinding in the shoulder, but states this feels different than her normal pain.  Pain starts in the shoulder will go down to the elbow and into the wrist, also feels a going into the thumb with some numbness or tingling.  Her pain is more of an electric-like pain, feels like she "hit her funny bone" and is sensitive.  This feels different than her bone pain that she has experienced in the jaw.  She does note some weakness with gripping on the right hand, sometimes does drop objects.  Her pain comes and goes.  Pertinent ROS were reviewed with the patient and found to be negative unless otherwise specified above in HPI.   Assessment & Plan: Visit Diagnoses:  1. Chronic right shoulder pain   2. Primary osteoarthritis, right shoulder   3. Other spondylosis with radiculopathy, cervical region   4. DDD (degenerative disc disease), cervical    Plan: Discussed with Angela [name] the nature of her right shoulder and arm pain.  She only got about 20-25% relief from glenohumeral joint injection that we performed last month.  Today her symptoms are more radicular like in  nature going down the arm and into the thumb/wrist.  She does have worsening of DDD in the cervical spine most notably at the C5-C6 and C6-C7 juncture.  She also has severe osteoarthritis of the right shoulder which I think is contributing but more of her current symptoms seem to be radicular in nature from the C-spine.  For diagnostic purposes, we did proceed with subacromial joint injection to see if this does calm down her pain or it still prevails despite the injection.  She will let me know in about 10 days through messaging or calling the office how much relief she got.  If she does not receive significant relief, we will refer her to Dr. Christell Constant or Dr. Ophelia Charter. She can continue her Subutex 4-8mg  sublingual for pain.  Follow-up: Return for She will call in 10 days to update me on improvement s/p injection.   Meds & Orders: No orders of the defined types were placed in this encounter.   Orders Placed This Encounter  Procedures   Large Joint Inj: R subacromial bursa   XR Shoulder Right   XR Cervical Spine 2 or 3 views     Procedures: Large Joint Inj: R subacromial bursa on 05/07/2022 10:01 AM Indications: pain Details: 22 G 1.5 in needle, posterior approach Medications: 2 mL lidocaine 1 %; 2 mL bupivacaine 0.25 %; 40 mg methylPREDNISolone acetate 40 MG/ML Outcome: tolerated well, no immediate complications  Subacromial Joint Injection, Right Shoulder After discussion on risks/benefits/indications,  informed verbal consent was obtained. A timeout was then performed. Patient was seated on table in exam room. The patient's shoulder was prepped with betadine and alcohol swabs and utilizing posterior approach a 22G, 1.5" needle was directed anteriorly and laterally into the patient's subacromial space was injected with 2:2:1 mixture of lidocaine:bupivicaine:depomedrol with appreciation of free-flowing of the injectate into the bursal space. Patient tolerated the procedure well without immediate  complications.   Procedure, treatment alternatives, risks and benefits explained, specific risks discussed. Consent was given by the patient. Immediately prior to procedure a time out was called to verify the correct patient, procedure, equipment, support staff and site/side marked as required. Patient was prepped and draped in the usual sterile fashion.          Clinical History: No specialty comments available.  She reports that she has quit smoking. She has never used smokeless tobacco. No results for input(s): "HGBA1C", "LABURIC" in the last 8760 hours.  Objective:   Vital Signs: There were no vitals taken for this visit.  Physical Exam  Gen: Well-appearing, in no acute distress; non-toxic CV: Well-perfused. Warm.  Resp: Breathing unlabored on room air; no wheezing. Psych: Fluid speech in conversation; appropriate affect; normal thought process Neuro: Sensation intact throughout. No gross coordination deficits.   Ortho Exam - Right shoulder: No redness.  There is mild restriction in flexion to about 160 degrees and abduction to 150 degrees with passive range of motion, she has about 20 degrees less of motion with active range of motion, but able to be taken further passively.  External rotation block at about 45 degrees.  There is pain with IR/ER.  There is crepitus when taking it through range of motion.  There is some associated pain with Leanord Asal, resisted ER/IR.  - Cervical: No midline spinous process TTP.  There is some mild restriction with endrange extension.  There is weakness with resisted shoulder abduction.  Mildly diminished grip strength on the right compared to the left.  There is diminished biceps DTR on the right compared to 2/4 on the left.  2/4 triceps DTRs bilaterally.  Positive Spurling's test on the right.  Imaging: XR Shoulder Right  Result Date: 05/07/2022 4 views of the right shoulder including AP, Grashey, scapular Y and axial view were ordered and  reviewed myself.  X-rays show severe glenohumeral joint arthritis with mild superior migration of the humeral head.  There is bone-on-bone change of the glenohumeral joint, moderate AC joint arthropathy.  XR Cervical Spine 2 or 3 views  Result Date: 05/07/2022 2 views of the cervical spine including AP and lateral film were ordered and reviewed by myself.  There is multilevel spondylosis with degenerative disc disease.  Most notable at C4-C7.  There is anterior spurring and significant degenerative disc disease at the C5-C6 level.  No significant facet arthropathy.   *Independent review of right shoulder and cervical spine x-rays from 10/31/2020 were reviewed by myself and compared to these current x-rays from today.  There is worsening of both glenohumeral joint arthritis of the shoulder as well as worsening C5-C6 and C6/C7 degenerative disc disease and arthritic change of the cervical spine from today's x-rays compared to 2022.  Narrative & Impression  CLINICAL DATA:  Neck pain radiating to the right arm and hand. Right sided weakness.   EXAM: MRI CERVICAL SPINE WITHOUT CONTRAST   TECHNIQUE: Multiplanar, multisequence MR imaging of the cervical spine was performed. No intravenous contrast was administered.   COMPARISON:  Radiography 10/31/2020.  MRI 09/19/2019.   FINDINGS: Alignment: No malalignment.   Vertebrae: No fracture or focal bone lesion. Mild discogenic endplate changes at C5-6 and C6-7.   Cord: No cord compression or focal cord lesion.   Posterior Fossa, vertebral arteries, paraspinal tissues: Negative   Disc levels:   Foramen magnum is widely patent.  C1-2 and C2-3 are normal.   C3-4: Bulging of the disc with uncovertebral prominence, right worse than left. No compressive canal stenosis. Proximal foraminal narrowing right more than left. Some potential the right C4 nerve could be affected. Slight worsening since 2021.   C4-5: Endplate osteophytes and bulging of  the disc. Narrowing of the ventral subarachnoid space but no compression of the cord. Mild proximal foraminal narrowing, not likely compressive. No apparent change.   C5-6: Endplate osteophytes and bulging of the disc. Mild narrowing of the subarachnoid space but no compressive effect upon the cord. Mild proximal foraminal narrowing, not likely compressive. No change.   C6-7: Endplate osteophytes and bulging of the disc. Narrowing of the ventral subarachnoid space but no compressive effect upon the cord. Mild proximal foraminal narrowing, not likely compressive. No change.   C7-T1: Normal interspace.   IMPRESSION: Multilevel cervical spondylosis as outlined above. No compressive stenosis of the central canal. At C3-4, there is uncovertebral disease more prominent on the right than the left with proximal foraminal encroachment that could possibly affect in particular the right C4 nerve. This has worsened slightly since 2021.   The degenerative changes otherwise at C4-5, C5-6 and C6-7 appear stable since the prior exam without apparent neural compression.     Electronically Signed   By: Paulina Fusi M.D.   On: 11/17/2020 11:11    Past Medical/Family/Surgical/Social History: Medications & Allergies reviewed per EMR, new medications updated. Patient Active Problem List   Diagnosis Date Noted   Other spondylosis with radiculopathy, cervical region 11/03/2020   OSA on CPAP 05/26/2020   Attention deficit hyperactivity disorder, predominantly inattentive type 07/25/2019   Chronic pain syndrome 07/25/2019   Irritable bowel syndrome 07/25/2019   Urethral caruncle 07/25/2019   Thyroid dysfunction 07/25/2019   Osteoporosis 07/09/2019   Hyperprolactinemia (HCC) 07/09/2019   Trochanteric bursitis, left hip 06/28/2017   Chronic, continuous use of opioids 10/12/2016   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/12/2016   Other retention of urine 10/12/2016   Vaginal atrophy 10/12/2016    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   New onset headache 01/19/2016   Excessive sleepiness 01/19/2016   HYPERTENSION, BENIGN 03/10/2009   HYPERTENSION, UNCONTROLLED 01/29/2009   ARM PAIN, LEFT 01/29/2009   Past Medical History:  Diagnosis Date   Cancer (HCC)    skin   Chronic pain    Complication of anesthesia    COVID 2023   Difficult intubation    19 jaw/TMJ surgeries with "bone fused to skull" leading to very limited mouth opening   Headache    Hypertension    Hypothyroidism    Joint disorder    Mitral valve prolapse    Pneumonia    PONV (postoperative nausea and vomiting)    Porphyria (HCC)    "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)   Sleep apnea    Family History  Problem Relation Age of Onset   Breast cancer Mother    Rheum arthritis Mother    Aneurysm Father    Heart disease Father    Throat cancer Father    Melanoma Father    Heart disease Brother    Heart disease Brother    Thyroid  disease Brother    Post-traumatic stress disorder Brother    Rheum arthritis Brother    Polymyositis Brother    Obesity Brother    Past Surgical History:  Procedure Laterality Date   ABDOMINAL HYSTERECTOMY     Age 47   ANKLE SURGERY     x3   APPENDECTOMY     BIOPSY  10/19/2018   Procedure: BIOPSY;  Surgeon: Kerin Salen, MD;  Location: [facility];  Service: Gastroenterology;;   BIOPSY  04/11/2020   Procedure: BIOPSY;  Surgeon: Kerin Salen, MD;  Location: [facility];  Service: Gastroenterology;;   COLONOSCOPY WITH PROPOFOL N/A 10/19/2018   Procedure: COLONOSCOPY WITH PROPOFOL;  Surgeon: Kerin Salen, MD;  Location: [facility];  Service: Gastroenterology;  Laterality: N/A;   COLONOSCOPY WITH PROPOFOL N/A 04/11/2020   Procedure: COLONOSCOPY WITH PROPOFOL;  Surgeon: Kerin Salen, MD;  Location: [facility];  Service: Gastroenterology;  Laterality: N/A;  (difficulty airway)    DILATION AND CURETTAGE OF UTERUS     x 4   ESOPHAGOGASTRODUODENOSCOPY (EGD) WITH  PROPOFOL N/A 10/19/2018   Procedure: ESOPHAGOGASTRODUODENOSCOPY (EGD) WITH PROPOFOL;  Surgeon: Kerin Salen, MD;  Location: [facility];  Service: Gastroenterology;  Laterality: N/A;   FRACTURE SURGERY     jaw   KNEE SURGERY     x 3   KYPHOPLASTY N/A 08/07/2019   Procedure: Thoracic twelve Vertebral Augmentation with Stenting/SpineJack;  Surgeon: Bedelia Person, MD;  Location: [facility];  Service: Neurosurgery;  Laterality: N/A;   MANDIBLE FRACTURE SURGERY     x 19   POLYPECTOMY  10/19/2018   Procedure: POLYPECTOMY;  Surgeon: Kerin Salen, MD;  Location: [facility];  Service: Gastroenterology;;   POLYPECTOMY  04/11/2020   Procedure: POLYPECTOMY;  Surgeon: Kerin Salen, MD;  Location: [facility];  Service: Gastroenterology;;   Social History   Occupational History   Occupation: Part-time Investment banker, corporate  Tobacco Use   Smoking status: Former   Smokeless tobacco: Never   Tobacco comments:    Quit January 1980  Vaping Use   Vaping Use: Never used  Substance and Sexual Activity   Alcohol use: Not Currently    Alcohol/week: 0.0 - 5.0 standard drinks of alcohol   Drug use: No   Sexual activity: Not on file

## 2022-06-01 DIAGNOSIS — R5382 Chronic fatigue, unspecified: Secondary | ICD-10-CM | POA: Diagnosis not present

## 2022-06-01 DIAGNOSIS — I10 Essential (primary) hypertension: Secondary | ICD-10-CM | POA: Diagnosis not present

## 2022-06-01 DIAGNOSIS — E0789 Other specified disorders of thyroid: Secondary | ICD-10-CM | POA: Diagnosis not present

## 2022-06-01 DIAGNOSIS — Z0000 Encounter for general adult medical examination without abnormal findings: Secondary | ICD-10-CM | POA: Diagnosis not present

## 2022-06-02 DIAGNOSIS — R110 Nausea: Secondary | ICD-10-CM | POA: Diagnosis not present

## 2022-06-02 DIAGNOSIS — L570 Actinic keratosis: Secondary | ICD-10-CM | POA: Diagnosis not present

## 2022-06-02 DIAGNOSIS — D485 Neoplasm of uncertain behavior of skin: Secondary | ICD-10-CM | POA: Diagnosis not present

## 2022-06-02 DIAGNOSIS — C44729 Squamous cell carcinoma of skin of left lower limb, including hip: Secondary | ICD-10-CM | POA: Diagnosis not present

## 2022-06-02 DIAGNOSIS — Z85828 Personal history of other malignant neoplasm of skin: Secondary | ICD-10-CM | POA: Diagnosis not present

## 2022-06-02 DIAGNOSIS — K648 Other hemorrhoids: Secondary | ICD-10-CM | POA: Diagnosis not present

## 2022-06-02 DIAGNOSIS — C44722 Squamous cell carcinoma of skin of right lower limb, including hip: Secondary | ICD-10-CM | POA: Diagnosis not present

## 2022-06-02 DIAGNOSIS — Z86010 Personal history of colonic polyps: Secondary | ICD-10-CM | POA: Diagnosis not present

## 2022-06-04 ENCOUNTER — Telehealth: Payer: Self-pay | Admitting: Orthopedic Surgery

## 2022-06-04 NOTE — Telephone Encounter (Signed)
Angela [name] called in to give Dr. Shon Baton an update on her shoulder.  She was seen 4/26 and told to report back on how her injection worked.  She states that for about 10-12 days after the injection, nothing changed.  Around 2 weeks s/p injection her nerve pain improved but only for about 4-5 days.  She emphasizes that the nerve pain improved but she still had limited to no range of motion in the arm.  Now she is not back to "square one" and would like to know what she should do next.  She mentioned a referral to another MD in our office was the suggestion. She remembers an injection she once had with [name] that lasted for over a year and she questions if that might have been a different medication.  Please call to discuss.

## 2022-06-08 ENCOUNTER — Other Ambulatory Visit: Payer: Self-pay | Admitting: Sports Medicine

## 2022-06-08 MED ORDER — METHYLPREDNISOLONE 4 MG PO TBPK
ORAL_TABLET | ORAL | 0 refills | Status: DC
Start: 2022-06-08 — End: 2023-03-10

## 2022-06-09 NOTE — Telephone Encounter (Signed)
I have her scheduled for 6/13 at 9:45 This is the only slot that was a  Christy, please advise if you see a sooner opening or can reschedule her for sooner.  thanks

## 2022-06-24 ENCOUNTER — Other Ambulatory Visit (INDEPENDENT_AMBULATORY_CARE_PROVIDER_SITE_OTHER): Payer: Medicare Other

## 2022-06-24 ENCOUNTER — Encounter: Payer: Self-pay | Admitting: Orthopedic Surgery

## 2022-06-24 ENCOUNTER — Ambulatory Visit (INDEPENDENT_AMBULATORY_CARE_PROVIDER_SITE_OTHER): Payer: Medicare Other | Admitting: Orthopedic Surgery

## 2022-06-24 VITALS — BP 127/80 | HR 70 | Ht 65.0 in | Wt 125.8 lb

## 2022-06-24 DIAGNOSIS — M542 Cervicalgia: Secondary | ICD-10-CM | POA: Diagnosis not present

## 2022-06-24 DIAGNOSIS — M5412 Radiculopathy, cervical region: Secondary | ICD-10-CM

## 2022-06-24 NOTE — Progress Notes (Signed)
Orthopedic Spine Surgery Office Note  Assessment: Patient is a 77 y.o. female with right upper extremity pain. Goes from her shoulder into her hand, possible radiculopathy   Plan: -Explained that initially conservative treatment is tried as a significant number of patients may experience relief with these treatment modalities. Discussed that the conservative treatments include:  -activity modification  -physical therapy  -over the counter pain medications  -medrol dosepak  -cervical steroid injections -Patient has tried Tylenol, Medrol Dosepak, right shoulder steroid injections, buprenorphine -Explained that some of her pain is coming from the shoulder based on her exam -Patient is tried over 6 weeks of conservative treatment, so recommend MRI of the cervical spine to evaluate for radiculopathy -Recommended trial of a diagnostic/therapeutic cervical ESI -Patient should return to office in 6 weeks, x-rays at next visit: None   Patient expressed understanding of the plan and all questions were answered to the patient's satisfaction.   ___________________________________________________________________________   History:  Patient is a 77 y.o. female who presents today for cervical spine.  Patient has had 3 years of pain that radiates into her right shoulder and into the right upper extremity.  She states it was initially worse in the right shoulder area but now the pain radiating into her arm is worse.  She feels that goes in multiple distributions in the right upper extremity all the way to the fingers.  She feels it in all the fingers.  She does not have any symptoms in the left upper extremity.  There is no trauma or injury that preceded the onset of pain.  She had been getting shoulder steroid injections that were initially helping her pain but are no longer providing her with any relief.  She has not noticed any other treatments that are helpful.   Weakness: Denies Difficulty with  fine motor skills (e.g., buttoning shirts, handwriting): Denies Symptoms of imbalance: Denies Paresthesias and numbness: Denies Bowel or bladder incontinence: Denies Saddle anesthesia: Denies  Treatments tried: Tylenol, Medrol Dosepak, shoulder injections, buprenorphine  Review of systems: Denies fevers and chills, night sweats, unexplained weight loss.  Has had pain that wakes her at night, history of skin cancer  Past medical history: Hypertension Osteoporosis (not treated due to multiple jaw surgeries) OSA Chronic pain Hypothyroidism  Allergies: NSAIDs, doxycycline  Past surgical history:  Multiple jaw surgeries (19) Lumbar spine surgery Hysterectomy T12 kyphoplasty Ankle surgery (3) Appendectomy D&C of uterus Knee surgery (3) Polypectomy  Social history: Denies use of nicotine product (smoking, vaping, patches, smokeless) Alcohol use: Yes, 5-7 drinks per week Denies recreational drug use   Physical Exam:  BMI of 20.9  General: no acute distress, appears stated age Neurologic: alert, answering questions appropriately, following commands Respiratory: unlabored breathing on room air, symmetric chest rise Psychiatric: appropriate affect, normal cadence to speech   MSK (spine):  -Strength exam      Left  Right Grip strength               5/5  5/5 Interosseus   5/5   5/5 Wrist extension  5/5  5/5 Wrist flexion   5/5  5/5 Elbow flexion   5/5  5/5 Deltoid    5/5  5/5  -Sensory exam    Sensation intact to light touch in L3-S1 nerve distributions of bilateral lower extremities  -Brachioradialis DTR: 2/4 on the left, 2/4 on the right -Biceps DTR: 2/4 on the left, 2/4 on the right  -Spurling: Negative bilaterally -Hoffman sign: Negative bilaterally -Clonus: No beats bilaterally -  Interosseous wasting: None seen -Grip and release test: Negative -Gait: Normal  Left shoulder exam: No pain through range of motion Right shoulder exam: Pain with internal and  external rotation, positive Hawkins, pain with Jobe, no weakness with external rotation with arm at side, negative belly press  Tinel's at wrist: Negative bilaterally Phalen's at wrist: Negative bilaterally Durkan's: Negative bilaterally  Tinel's at elbow: Negative bilaterally  Imaging: XR of the cervical spine from 05/07/2022 and 06/24/2022 was independently reviewed and interpreted, showing disc height loss at C3/4, C4/5, C5/6, C6/7.  Anterior osteophyte formation seen at C5/6 and C6/7.  No evidence of instability on flexion/extension views.  No fracture or dislocation seen.   Patient name: Angela [name] Patient MRN: [id number] Date of visit: 06/24/22

## 2022-06-28 ENCOUNTER — Ambulatory Visit
Admission: RE | Admit: 2022-06-28 | Discharge: 2022-06-28 | Disposition: A | Discharge status: Home / Self Care | Payer: Medicare Other | Source: Ambulatory Visit | Attending: Orthopedic Surgery | Admitting: Orthopedic Surgery

## 2022-06-28 DIAGNOSIS — M5412 Radiculopathy, cervical region: Secondary | ICD-10-CM

## 2022-06-28 DIAGNOSIS — M47812 Spondylosis without myelopathy or radiculopathy, cervical region: Secondary | ICD-10-CM | POA: Diagnosis not present

## 2022-07-08 ENCOUNTER — Other Ambulatory Visit: Payer: Self-pay

## 2022-07-08 ENCOUNTER — Ambulatory Visit (INDEPENDENT_AMBULATORY_CARE_PROVIDER_SITE_OTHER): Payer: Medicare Other | Admitting: Physical Medicine and Rehabilitation

## 2022-07-08 VITALS — BP 126/74 | HR 73

## 2022-07-08 DIAGNOSIS — M5412 Radiculopathy, cervical region: Secondary | ICD-10-CM | POA: Diagnosis not present

## 2022-07-08 DIAGNOSIS — L570 Actinic keratosis: Secondary | ICD-10-CM | POA: Diagnosis not present

## 2022-07-08 DIAGNOSIS — L821 Other seborrheic keratosis: Secondary | ICD-10-CM | POA: Diagnosis not present

## 2022-07-08 DIAGNOSIS — Z85828 Personal history of other malignant neoplasm of skin: Secondary | ICD-10-CM | POA: Diagnosis not present

## 2022-07-08 MED ORDER — METHYLPREDNISOLONE ACETATE 80 MG/ML IJ SUSP
80.0000 mg | Freq: Once | INTRAMUSCULAR | Status: AC
Start: 2022-07-08 — End: 2022-07-08
  Administered 2022-07-08: 80 mg

## 2022-07-08 NOTE — Progress Notes (Signed)
Functional Pain Scale - descriptive words and definitions  Distressing (6)    Pain is present/unable to complete most ADLs limited by pain/sleep is difficult and active distraction is only marginal. Moderate range order  Average Pain 7-8    +Driver, -BT, -Dye Allergies.  Neck pain on the right side that radiates into the right arm to the fingers

## 2022-07-08 NOTE — Patient Instructions (Signed)

## 2022-07-09 NOTE — Procedures (Signed)
Cervical Epidural Steroid Injection - Interlaminar Approach with Fluoroscopic Guidance  Patient: Angela [name]      Date of Birth: 30-Nov-1945 MRN: [id number] PCP: Irena Reichmann, DO      Visit Date: 07/08/2022   Universal Protocol:    Date/Time: 06/28/248:55 AM  Consent Given By: the patient  Position: PRONE  Additional Comments: Vital signs were monitored before and after the procedure. Patient was prepped and draped in the usual sterile fashion. The correct patient, procedure, and site was verified.   Injection Procedure Details:   Procedure diagnoses: Cervical radiculopathy [M54.12]    Meds Administered:  Meds ordered this encounter  Medications   methylPREDNISolone acetate (DEPO-MEDROL) injection 80 mg     Laterality: Right  Location/Site: C7-T1  Needle: 3.5 in., 20 ga. Tuohy  Needle Placement: Paramedian epidural space  Findings:  -Comments: Excellent flow of contrast into the epidural space.  Procedure Details: Using a paramedian approach from the side mentioned above, the region overlying the inferior lamina was localized under fluoroscopic visualization and the soft tissues overlying this structure were infiltrated with 4 ml. of 1% Lidocaine without Epinephrine. A # 20 gauge, Tuohy needle was inserted into the epidural space using a paramedian approach.  The epidural space was localized using loss of resistance along with contralateral oblique bi-planar fluoroscopic views.  After negative aspirate for air, blood, and CSF, a 2 ml. volume of Isovue-250 was injected into the epidural space and the flow of contrast was observed. Radiographs were obtained for documentation purposes.   The injectate was administered into the level noted above.  Additional Comments:  No complications occurred Dressing: 2 x 2 sterile gauze and Band-Aid    Post-procedure details: Patient was observed during the procedure. Post-procedure instructions were reviewed.  Patient left  the clinic in stable condition.

## 2022-07-09 NOTE — Progress Notes (Signed)
Angela [name] - 77 y.o. female MRN [id number]  Date of birth: December 23, 1945  Office Visit Note: Visit Date: 07/08/2022 PCP: Irena Reichmann, DO Referred by: London Sheer, MD  Subjective: Chief Complaint  Patient presents with   Neck - Pain   HPI:  Angela [name] is a 77 y.o. female who comes in today at the request of Willia Craze, MD for planned Right C7-T1 Cervical Interlaminar epidural steroid injection with fluoroscopic guidance.  The patient has failed conservative care including home exercise, medications, time and activity modification.  This injection will be diagnostic and hopefully therapeutic.  Please see requesting physician notes for further details and justification.   ROS Otherwise per HPI.  Assessment & Plan: Visit Diagnoses:    ICD-10-CM   1. Cervical radiculopathy  M54.12 XR C-ARM NO REPORT    Epidural Steroid injection    methylPREDNISolone acetate (DEPO-MEDROL) injection 80 mg      Plan: No additional findings.   Meds & Orders:  Meds ordered this encounter  Medications   methylPREDNISolone acetate (DEPO-MEDROL) injection 80 mg    Orders Placed This Encounter  Procedures   XR C-ARM NO REPORT   Epidural Steroid injection    Follow-up: Return for visit to requesting provider as needed.   Procedures: No procedures performed  Cervical Epidural Steroid Injection - Interlaminar Approach with Fluoroscopic Guidance  Patient: Angela [name]      Date of Birth: 1945-04-12 MRN: [id number] PCP: Irena Reichmann, DO      Visit Date: 07/08/2022   Universal Protocol:    Date/Time: 06/28/248:55 AM  Consent Given By: the patient  Position: PRONE  Additional Comments: Vital signs were monitored before and after the procedure. Patient was prepped and draped in the usual sterile fashion. The correct patient, procedure, and site was verified.   Injection Procedure Details:   Procedure diagnoses: Cervical radiculopathy [M54.12]    Meds Administered:  Meds  ordered this encounter  Medications   methylPREDNISolone acetate (DEPO-MEDROL) injection 80 mg     Laterality: Right  Location/Site: C7-T1  Needle: 3.5 in., 20 ga. Tuohy  Needle Placement: Paramedian epidural space  Findings:  -Comments: Excellent flow of contrast into the epidural space.  Procedure Details: Using a paramedian approach from the side mentioned above, the region overlying the inferior lamina was localized under fluoroscopic visualization and the soft tissues overlying this structure were infiltrated with 4 ml. of 1% Lidocaine without Epinephrine. A # 20 gauge, Tuohy needle was inserted into the epidural space using a paramedian approach.  The epidural space was localized using loss of resistance along with contralateral oblique bi-planar fluoroscopic views.  After negative aspirate for air, blood, and CSF, a 2 ml. volume of Isovue-250 was injected into the epidural space and the flow of contrast was observed. Radiographs were obtained for documentation purposes.   The injectate was administered into the level noted above.  Additional Comments:  No complications occurred Dressing: 2 x 2 sterile gauze and Band-Aid    Post-procedure details: Patient was observed during the procedure. Post-procedure instructions were reviewed.  Patient left the clinic in stable condition.   Clinical History: MRI CERVICAL SPINE WITHOUT CONTRAST   TECHNIQUE: Multiplanar, multisequence MR imaging of the cervical spine was performed. No intravenous contrast was administered.   COMPARISON:  Radiography 10/31/2020.  MRI 09/19/2019.   FINDINGS: Alignment: No malalignment.   Vertebrae: No fracture or focal bone lesion. Mild discogenic endplate changes at C5-6 and C6-7.   Cord: No cord compression or focal cord  lesion.   Posterior Fossa, vertebral arteries, paraspinal tissues: Negative   Disc levels:   Foramen magnum is widely patent.  C1-2 and C2-3 are normal.   C3-4:  Bulging of the disc with uncovertebral prominence, right worse than left. No compressive canal stenosis. Proximal foraminal narrowing right more than left. Some potential the right C4 nerve could be affected. Slight worsening since 2021.   C4-5: Endplate osteophytes and bulging of the disc. Narrowing of the ventral subarachnoid space but no compression of the cord. Mild proximal foraminal narrowing, not likely compressive. No apparent change.   C5-6: Endplate osteophytes and bulging of the disc. Mild narrowing of the subarachnoid space but no compressive effect upon the cord. Mild proximal foraminal narrowing, not likely compressive. No change.   C6-7: Endplate osteophytes and bulging of the disc. Narrowing of the ventral subarachnoid space but no compressive effect upon the cord. Mild proximal foraminal narrowing, not likely compressive. No change.   C7-T1: Normal interspace.   IMPRESSION: Multilevel cervical spondylosis as outlined above. No compressive stenosis of the central canal. At C3-4, there is uncovertebral disease more prominent on the right than the left with proximal foraminal encroachment that could possibly affect in particular the right C4 nerve. This has worsened slightly since 2021.   The degenerative changes otherwise at C4-5, C5-6 and C6-7 appear stable since the prior exam without apparent neural compression.     Electronically Signed   By: Paulina Fusi M.D.   On: 11/17/2020 11:11     Objective:  VS:  HT:    WT:   BMI:     BP:126/74  HR:73bpm  TEMP: ( )  RESP:  Physical Exam Vitals and nursing note reviewed.  Constitutional:      General: She is not in acute distress.    Appearance: Normal appearance. She is not ill-appearing.  HENT:     Head: Normocephalic and atraumatic.     Right Ear: External ear normal.     Left Ear: External ear normal.  Eyes:     Extraocular Movements: Extraocular movements intact.  Cardiovascular:     Rate and  Rhythm: Normal rate.     Pulses: Normal pulses.  Musculoskeletal:     Cervical back: Tenderness present. No rigidity.     Right lower leg: No edema.     Left lower leg: No edema.     Comments: Patient has good strength in the upper extremities including 5 out of 5 strength in wrist extension long finger flexion and APB.  There is no atrophy of the hands intrinsically.  There is a negative Hoffmann's test.   Lymphadenopathy:     Cervical: No cervical adenopathy.  Skin:    Findings: No erythema, lesion or rash.  Neurological:     General: No focal deficit present.     Mental Status: She is alert and oriented to person, place, and time.     Sensory: No sensory deficit.     Motor: No weakness or abnormal muscle tone.     Coordination: Coordination normal.  Psychiatric:        Mood and Affect: Mood normal.        Behavior: Behavior normal.      Imaging: XR C-ARM NO REPORT  Result Date: 07/08/2022 Please see Notes tab for imaging impression.

## 2022-07-12 DIAGNOSIS — E079 Disorder of thyroid, unspecified: Secondary | ICD-10-CM | POA: Diagnosis not present

## 2022-07-28 ENCOUNTER — Ambulatory Visit: Payer: Medicare Other | Admitting: Orthopedic Surgery

## 2022-07-29 DIAGNOSIS — M2550 Pain in unspecified joint: Secondary | ICD-10-CM | POA: Diagnosis not present

## 2022-07-29 DIAGNOSIS — M79601 Pain in right arm: Secondary | ICD-10-CM | POA: Diagnosis not present

## 2022-07-29 DIAGNOSIS — M129 Arthropathy, unspecified: Secondary | ICD-10-CM | POA: Diagnosis not present

## 2022-08-05 DIAGNOSIS — G5690 Unspecified mononeuropathy of unspecified upper limb: Secondary | ICD-10-CM | POA: Diagnosis not present

## 2022-08-05 DIAGNOSIS — M79601 Pain in right arm: Secondary | ICD-10-CM | POA: Diagnosis not present

## 2022-08-05 DIAGNOSIS — F909 Attention-deficit hyperactivity disorder, unspecified type: Secondary | ICD-10-CM | POA: Diagnosis not present

## 2022-08-11 ENCOUNTER — Ambulatory Visit: Payer: Medicare Other | Admitting: Orthopedic Surgery

## 2022-08-11 DIAGNOSIS — M5412 Radiculopathy, cervical region: Secondary | ICD-10-CM | POA: Diagnosis not present

## 2022-08-11 MED ORDER — PREGABALIN 75 MG PO CAPS: 75.0000 mg | ORAL_CAPSULE | Freq: Two times a day (BID) | ORAL | 0 refills | Status: DC

## 2022-08-11 NOTE — Progress Notes (Signed)
Orthopedic Spine Surgery Office Note   Assessment: Patient is a 77 y.o. female with right upper extremity pain. Goes from her shoulder into her hand, possible radiculopathy.  Has right-sided foraminal stenosis at C3/4 and C5/6, but based on her distribution I do not think that the C3/4 is causing symptoms.  She did not get any relief with a cervical ESI.     Plan: -Patient has tried Tylenol, Medrol Dosepak, right shoulder steroid injections, buprenorphine, cervical ESI -Still feel that a component of her pain is coming from the shoulder but that would not explain the pain radiating on the arm -Ordered an EMG/NCS for further workup of possible radiculopathy -Prescribed Lyrica for additional pain relief -Patient should return to office in 6 weeks, x-rays at next visit: None     Patient expressed understanding of the plan and all questions were answered to the patient's satisfaction.    ___________________________________________________________________________     History:   Patient is a 77 y.o. female who presents today for follow up on her cervical spine.  To recap, patient has had over 3 years of pain that radiates into the right shoulder and into the right upper extremity.  She feels it is worse in the shoulder but pain has developed into the arm.  She feels that go all the way down the arm in multiple distributions to the level of the fingers.  She says it is worse in the thumb, index, long fingers.  She is not having any pain in the left upper extremity.  Her pain is similar to the last time I saw her.  After our last visit, she got a cervical ESI but did not get any relief with that injection.  She has not developed any new symptoms since the last time I saw her.     Treatments tried: Tylenol, Medrol Dosepak, shoulder injections, buprenorphine, cervical ESI    Physical Exam:   General: no acute distress, appears stated age Neurologic: alert, answering questions appropriately,  following commands Respiratory: unlabored breathing on room air, symmetric chest rise Psychiatric: appropriate affect, normal cadence to speech     MSK (spine):   -Strength exam                                                   Left                  Right Grip strength               5/5                   5/5 Interosseus                  5/5                  5/5 Wrist extension            5/5                  5/5 Wrist flexion                 5/5                  5/5 Elbow flexion                5/5  5/5 Deltoid                          5/5                  5/5   -Sensory exam                           Sensation intact to light touch in L3-S1 nerve distributions of bilateral lower extremities   -Spurling: Negative bilaterally   Left shoulder exam: No pain through range of motion Right shoulder exam: Pain with internal and external rotation, positive Hawkins, pain with Jobe, no weakness with external rotation with arm at side, negative belly press   Tinel's at wrist: Negative bilaterally Phalen's at wrist: Negative bilaterally Durkan's: Negative bilaterally   Tinel's at elbow: Negative bilaterally    Imaging: XR of the cervical spine from 05/07/2022 and 06/24/2022 was independently reviewed and interpreted, showing disc height loss at C3/4, C4/5, C5/6, C6/7.  Anterior osteophyte formation seen at C5/6 and C6/7.  No evidence of instability on flexion/extension views.  No fracture or dislocation seen.   MRI of the cervical spine from 06/28/2022 was independently reviewed and interpreted, showing disc bulge at C3/4 near the exiting right C4 nerve.  Right-sided foraminal stenosis at C5/6.  No significant central stenosis.  No T2 cord signal change.    Patient name: Angela [name] Patient MRN: [id number] Date of visit: 08/11/22

## 2022-08-12 ENCOUNTER — Telehealth: Payer: Self-pay | Admitting: Orthopedic Surgery

## 2022-08-12 NOTE — Telephone Encounter (Signed)
FYI.Marland KitchenMarland KitchenMarland KitchenPt called in stating her arm has swollen up from her finger to her shoulder and it started last week, stated she forgot to tell Christell Constant

## 2022-08-13 ENCOUNTER — Telehealth: Payer: Self-pay | Admitting: Orthopedic Surgery

## 2022-08-13 MED ORDER — PREGABALIN 75 MG PO CAPS: 75.0000 mg | ORAL_CAPSULE | Freq: Two times a day (BID) | ORAL | 1 refills | Status: DC

## 2022-08-13 NOTE — Addendum Note (Signed)
Addended by: Willia Craze on: 08/13/2022 10:17 AM   Modules accepted: Orders

## 2022-08-13 NOTE — Telephone Encounter (Signed)
Patient called that the pharmacy that the medication was sent is the wrong on. It should be the pharmacy in [place] on [address] [phone]

## 2022-08-25 ENCOUNTER — Ambulatory Visit: Payer: Medicare Other | Admitting: Orthopaedic Surgery

## 2022-09-01 DIAGNOSIS — M25511 Pain in right shoulder: Secondary | ICD-10-CM | POA: Diagnosis not present

## 2022-09-01 DIAGNOSIS — R32 Unspecified urinary incontinence: Secondary | ICD-10-CM | POA: Diagnosis not present

## 2022-09-01 DIAGNOSIS — L659 Nonscarring hair loss, unspecified: Secondary | ICD-10-CM | POA: Diagnosis not present

## 2022-09-01 DIAGNOSIS — Z1231 Encounter for screening mammogram for malignant neoplasm of breast: Secondary | ICD-10-CM | POA: Diagnosis not present

## 2022-09-01 DIAGNOSIS — M7989 Other specified soft tissue disorders: Secondary | ICD-10-CM | POA: Diagnosis not present

## 2022-09-01 DIAGNOSIS — N3943 Post-void dribbling: Secondary | ICD-10-CM | POA: Diagnosis not present

## 2022-09-08 ENCOUNTER — Ambulatory Visit (INDEPENDENT_AMBULATORY_CARE_PROVIDER_SITE_OTHER): Payer: Medicare Other | Admitting: Neurology

## 2022-09-08 ENCOUNTER — Ambulatory Visit: Payer: Medicare Other | Admitting: Neurology

## 2022-09-08 VITALS — BP 97/57 | HR 67 | Ht 66.0 in

## 2022-09-08 DIAGNOSIS — G4710 Hypersomnia, unspecified: Secondary | ICD-10-CM

## 2022-09-08 DIAGNOSIS — Z23 Encounter for immunization: Secondary | ICD-10-CM | POA: Diagnosis not present

## 2022-09-08 DIAGNOSIS — R531 Weakness: Secondary | ICD-10-CM | POA: Diagnosis not present

## 2022-09-08 DIAGNOSIS — R3915 Urgency of urination: Secondary | ICD-10-CM

## 2022-09-08 DIAGNOSIS — M79601 Pain in right arm: Secondary | ICD-10-CM

## 2022-09-08 HISTORY — DX: Urgency of urination: R39.15

## 2022-09-08 MED ORDER — CELECOXIB 50 MG PO CAPS: 50.0000 mg | ORAL_CAPSULE | Freq: Two times a day (BID) | ORAL | 3 refills | Status: DC | PRN

## 2022-09-08 MED ORDER — DULOXETINE HCL 30 MG PO CPEP: 30.0000 mg | ORAL_CAPSULE | Freq: Every day | ORAL | 11 refills | Status: DC

## 2022-09-08 NOTE — Procedures (Signed)
Full Name: Angela [name] Gender: Female MRN #: [id number] Date of Birth: 1945-01-31    Visit Date: 09/08/2022 10:51 Age: 77 Years Examining Physician: Dr. Levert Feinstein Referring Physician: [facility] Height: 5 feet 6 inch History: 77 year old female with few months history of persistent right arm pain  Summary of the tests: Nerve conduction study: Bilateral ulnar sensory responses showed borderline prolonged peak latency, with robust snap amplitude.  Bilateral median sensory responses showed mildly prolonged peak latency with robust snap amplitude.  Bilateral median mixed response showed no significant difference.  Right radial sensory responses were normal.  Bilateral ulnar and median motor responses were normal  Electromyography: Selected needle examination of right upper extremity muscles and right cervical paraspinal muscles were normal   Conclusion: This is a normal study.  There is no electrodiagnostic evidence of right cervical radiculopathy or right upper extremity focal neuropathy.    ------------------------------- Levert Feinstein, M.D. PhD  [facility] [address], [address] [place], [place] [address] Tel: [phone] Fax: [phone]  Verbal informed consent was obtained from the patient, patient was informed of potential risk of procedure, including bruising, bleeding, hematoma formation, infection, muscle weakness, muscle pain, numbness, among others.        MNC    Nerve / Sites Muscle Latency Ref. Amplitude Ref. Rel Amp Segments Distance Velocity Ref. Area    ms ms mV mV %  cm m/s m/s mVms  L Median - APB     Wrist APB 4.3 ?4.4 6.4 ?4.0 100 Wrist - APB 7   19.0     Upper arm APB 9.1  5.4  84.1 Upper arm - Wrist 28 57 ?49 17.3  R Median - APB     Wrist APB 4.1 ?4.4 6.0 ?4.0 100 Wrist - APB 7   14.5     Upper arm APB 8.8  4.9  81.9 Upper arm - Wrist 27 57 ?49 14.6  L Ulnar - ADM     Wrist ADM 3.3 ?3.3 7.5 ?6.0 100 Wrist - ADM 7    28.5     B.Elbow ADM 5.3  7.3  97.5 B.Elbow - Wrist 12 60 ?49 28.2     A.Elbow ADM 8.1  7.1  97.6 A.Elbow - B.Elbow 15 54 ?49 27.1  R Ulnar - ADM     Wrist ADM 3.2 ?3.3 6.5 ?6.0 100 Wrist - ADM 7   22.4     B.Elbow ADM 5.3  6.6  102 B.Elbow - Wrist 11.6 56 ?49 22.5     A.Elbow ADM 8.3  6.4  97 A.Elbow - B.Elbow 16 53 ?49 21.7             SNC    Nerve / Sites Rec. Site Peak Lat Ref.  Amp Ref. Segments Distance Peak Diff Ref.    ms ms V V  cm ms ms  R Radial - Anatomical snuff box (Forearm)     Forearm Wrist 2.6 ?2.9 50 ?15 Forearm - Wrist 10    L Median, Ulnar - Transcarpal comparison     Median Palm Wrist 2.6 ?2.2 79 ?35 Median Palm - Wrist 8       Ulnar Palm Wrist 2.3 ?2.2 58 ?12 Ulnar Palm - Wrist 8          Median Palm - Ulnar Palm  0.3 ?0.4  R Median, Ulnar - Transcarpal comparison     Median Palm Wrist 2.6 ?2.2 70 ?35 Median Palm -  Wrist 8       Ulnar Palm Wrist 2.3 ?2.2 42 ?12 Ulnar Palm - Wrist 8          Median Palm - Ulnar Palm  0.3 ?0.4  L Median - Orthodromic (Dig II, Mid palm)     Dig II Wrist 3.5 ?3.4 40 ?10 Dig II - Wrist 13    R Median - Orthodromic (Dig II, Mid palm)     Dig II Wrist 3.7 ?3.4 28 ?10 Dig II - Wrist 13    L Ulnar - Orthodromic, (Dig V, Mid palm)     Dig V Wrist 3.1 ?3.1 20 ?5 Dig V - Wrist 11    R Ulnar - Orthodromic, (Dig V, Mid palm)     Dig V Wrist 3.3 ?3.1 10 ?5 Dig V - Wrist 56                     F  Wave    Nerve F Lat Ref.   ms ms  L Ulnar - ADM 27.4 ?32.0  R Ulnar - ADM 27.8 ?32.0         EMG Summary Table    Spontaneous MUAP Recruitment  Muscle IA Fib PSW Fasc Other Amp Dur. Poly Pattern  R. First dorsal interosseous Normal None None None _______ Normal Normal Normal Normal  R. Pronator teres Normal None None None _______ Normal Normal Normal Normal  R. Biceps brachii Normal None None None _______ Normal Normal Normal Normal  R. Deltoid Normal None None None _______ Normal Normal Normal Normal  R. Triceps brachii Normal None  None None _______ Normal Normal Normal Normal  R. Extensor digitorum communis Normal None None None _______ Normal Normal Normal Normal  R. Cervical paraspinals Normal None None None _______ Normal Normal Normal Normal

## 2022-09-08 NOTE — Progress Notes (Signed)
ASSESSMENT AND PLAN  Angela [name] is a 77 y.o. female   Right arm pain  In the setting of severe right glenohumeral joint arthritis, tenderness at right posterior elbow, deep palpitation with trigger radiating pain, similar to the pain she experienced, above findings are most suggestive of musculoskeletal etiology, suggest her continue follow-up with orthopedic surgeon for treatment  Add on Cymbalta 30 mg daily, Celebrex 50 mg as needed  Laboratory evaluation including ESR C-reactive protein for inflammatory markers  DIAGNOSTIC DATA (LABS, IMAGING, TESTING) - I reviewed patient records, labs, notes, testing and imaging myself where available.   MEDICAL HISTORY:  Angela [name], is a 77 year old right-handed female, seen in request by orthopedic surgeon Willia Craze for electrodiagnostic study for evaluation of her right arm pain, her primary care physician is Dr. Thomasena Edis, Annabelle Harman,  I reviewed and summarized the referring note.  Past medical history Obstructive sleep apnea Hypothyroidism Multiple jaw surgery  Around 2021 she began to have some right shoulder pain, was under the care of orthopedic surgeon, MRI of right shoulder in January 2022 showed moderate subacromial subdeltoid bursitis, mild to moderate supraspinatus and mild subscapularis tendinopathy, small partial thickness articular surface tear of the supraspinatus tendon, severe and worsened degenerative glenohumeral arthropathy, degenerative AC joint arthropathy, small glenohumeral joint effusion with mild localized synovitis thickening along the posterior band of the inferior glenohumeral ligament  She was treated with multiple dose of steroid injection has helped her symptoms  Around February 2024, she began to experience different kind of pain, she described radiating pain from right posterior armpit to right elbow, also to her right forearm, seems to be involving whole right side, intermittent worsening, to the point of  difficulty sleeping  She had a history of allergic reaction to NSAIDs, rashes in the past, has tried aspirin, multiple dose of Tylenol with limited help, gabapentin was providing some help as well Personally reviewed MRI cervical spine November 2022, multilevel cervical spondylosis, no significant canal foraminal narrowing  Most recent x-ray of the shoulder May 07, 2022, severe glenohumeral joint arthritis with mild superior migration of the humeral head, there is bone-on-bone changes of glenohumeral joint, moderate AC joint arthropathy  EMG nerve conduction study today showed no evidence of right upper extremity focal neuropathy or right cervical radiculopathy.  She has significant tenderness along right posterior elbow region with deep palpitation, the pain will radiate into right arm and forearm, similar to the pain she described, also have mild to moderate tenderness at the right anterior shoulder    PHYSICAL EXAM: 97/57, heart rate of 67  PHYSICAL EXAMNIATION:  Gen: NAD, conversant, well nourised, well groomed                     Cardiovascular: Regular rate rhythm, no peripheral edema, warm, nontender. Eyes: Conjunctivae clear without exudates or hemorrhage Neck: Supple, no carotid bruits. Pulmonary: Clear to auscultation bilaterally   NEUROLOGICAL EXAM:  MENTAL STATUS: Speech/cognition: Awake, alert, oriented to history taking and casual conversation CRANIAL NERVES: CN II: Visual fields are full to confrontation. Pupils are round equal and briskly reactive to light. CN III, IV, VI: extraocular movement are normal. No ptosis. CN V: Facial sensation is intact to light touch CN VII: Face is symmetric with normal eye closure  CN VIII: Hearing is normal to causal conversation. CN IX, X: Phonation is normal. CN XI: Head turning and shoulder shrug are intact  MOTOR: Right upper extremity motor strength is limited by right shoulder and elbow pain,  felt to there was no  significant muscle weakness, significant tenderness at right posterior elbow region, radiating to right forearm and arm,  REFLEXES: Reflexes are 1  and symmetric at the biceps, triceps, knees, and ankles. Plantar responses are flexor.  SENSORY: Intact to light touch, pinprick and vibratory sensation are intact in fingers and toes.  COORDINATION: There is no trunk or limb dysmetria noted.  GAIT/STANCE: Posture is normal. Gait is cautious  REVIEW OF SYSTEMS:  Full 14 system review of systems performed and notable only for as above All other review of systems were negative.   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 60 tablet 0   b complex vitamins capsule Take 1 capsule by mouth daily.     bisacodyl (DULCOLAX) 5 MG EC tablet Take 5 mg by mouth daily as needed for moderate constipation.     buprenorphine (SUBUTEX) 8 MG SUBL Place 4 mg under the tongue See admin instructions. Six times a day     Cholecalciferol (VITAMIN D3) 125 MCG (5000 UT) TABS Take 5,000 Units by mouth daily.     Cyanocobalamin (VITAMIN B-12) 5000 MCG SUBL Place 5,000 mcg under the tongue daily.     Estradiol 10 MCG TABS vaginal tablet Place 10 mcg vaginally 3 (three) times a week.     furosemide (LASIX) 20 MG tablet Take 20 mg by mouth daily as needed for fluid (fluid retention (feet/legs)).  0   methylPREDNISolone (MEDROL DOSEPAK) 4 MG TBPK tablet Take per packet instructions. Taper dosing. 1 each 0   ondansetron (ZOFRAN) 8 MG tablet Take 8 mg by mouth 2 (two) times daily as needed  for nausea or vomiting.      polyvinyl alcohol (LIQUIFILM TEARS) 1.4 % ophthalmic solution Place 1 drop into both eyes 3 (three) times daily.     potassium chloride (MICRO-K) 10 MEQ CR capsule Take 10 mEq by mouth daily.      pregabalin (LYRICA) 75 MG capsule Take 1 capsule (75 mg total) by mouth 2 (two) times daily. 60 capsule 1   thyroid (ARMOUR) 60 MG tablet Take 60 mg by mouth daily before breakfast.     valsartan-hydrochlorothiazide (DIOVAN-HCT) 160-25 MG tablet Take 1 tablet by mouth daily.     zinc gluconate 50 MG tablet Take 50 mg by mouth daily.     No current facility-administered medications for this visit.    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)   Sleep apnea     PAST SURGICAL HISTORY: Past Surgical History:  Procedure Laterality  Date   ABDOMINAL HYSTERECTOMY     Age 67   ANKLE SURGERY     x3   APPENDECTOMY     BIOPSY  10/19/2018   Procedure: BIOPSY;  Surgeon: Kerin Salen, MD;  Location: [facility];  Service: Gastroenterology;;   BIOPSY  04/11/2020   Procedure: BIOPSY;  Surgeon: Kerin Salen, MD;  Location: [facility];  Service: Gastroenterology;;   COLONOSCOPY WITH PROPOFOL N/A 10/19/2018   Procedure: COLONOSCOPY WITH PROPOFOL;  Surgeon: Kerin Salen, MD;  Location: [facility];  Service: Gastroenterology;  Laterality: N/A;   COLONOSCOPY WITH PROPOFOL N/A 04/11/2020   Procedure: COLONOSCOPY WITH PROPOFOL;  Surgeon: Kerin Salen, MD;  Location: [facility];  Service: Gastroenterology;  Laterality: N/A;  (difficulty  airway)    DILATION AND CURETTAGE OF UTERUS     x 4   ESOPHAGOGASTRODUODENOSCOPY (EGD) WITH PROPOFOL N/A 10/19/2018   Procedure: ESOPHAGOGASTRODUODENOSCOPY (EGD) WITH PROPOFOL;  Surgeon: Kerin Salen, MD;  Location: [facility];  Service: Gastroenterology;  Laterality: N/A;   FRACTURE SURGERY     jaw   KNEE SURGERY     x 3   KYPHOPLASTY N/A 08/07/2019   Procedure: Thoracic twelve Vertebral Augmentation with Stenting/SpineJack;  Surgeon: Bedelia Person, MD;  Location: [facility] OR;  Service: Neurosurgery;  Laterality: N/A;   MANDIBLE FRACTURE SURGERY     x 19   POLYPECTOMY  10/19/2018   Procedure: POLYPECTOMY;  Surgeon: Kerin Salen, MD;  Location: [facility];  Service: Gastroenterology;;   POLYPECTOMY  04/11/2020   Procedure: POLYPECTOMY;  Surgeon: Kerin Salen, MD;  Location: [facility];  Service: Gastroenterology;;    FAMILY HISTORY: Family History  Problem Relation Age of Onset   Breast cancer Mother    Rheum arthritis Mother    Aneurysm Father    Heart disease Father    Throat cancer Father    Melanoma Father    Heart disease Brother    Heart disease Brother    Thyroid disease Brother    Post-traumatic stress disorder Brother    Rheum arthritis Brother    Polymyositis Brother    Obesity Brother     SOCIAL HISTORY: Social History   Socioeconomic History   Marital status: Single    Spouse name: Not on file   Number of children: 1   Years of education: Bachelors   Highest education level: Not on file  Occupational History   Occupation: Part-time Investment banker, corporate  Tobacco Use   Smoking status: Former   Smokeless tobacco: Never   Tobacco comments:    Quit January 1980  Vaping Use   Vaping status: Never Used  Substance and Sexual Activity   Alcohol use: Not Currently    Alcohol/week: 0.0 - 5.0 standard drinks of alcohol   Drug use: No   Sexual activity: Not on file  Other Topics Concern   Not on file  Social History Narrative   Lives at home alone.    Right-handed.   2 cups caffeine per day.   Social Determinants of Health   Financial Resource Strain: Not on file  Food Insecurity: Not on file  Transportation Needs: Not on file  Physical Activity: Not on file  Stress: Not on file  Social Connections: Unknown (05/23/2021)   Received [facility]   Social Network    Social Network: Not on file  Intimate Partner Violence: Unknown (04/14/2021)   Received from [facility]   HITS    Physically Hurt: Not on file  Insult or Talk Down To: Not on file    Threaten Physical Harm: Not on file    Scream or Curse: Not on file      Levert Feinstein, M.D. Ph.D.  [facility] [address] [place], [place] [address] Ph: [phone] Fax: [phone]  CC:  London Sheer, MD [address] [place],  [place] [address]  Irena Reichmann, DO

## 2022-09-09 LAB — ANA W/REFLEX IF POSITIVE
Anti JO-1: <0.2 AI (ref 0.0–0.9)
Anti Nuclear Antibody (ANA): POSITIVE — AB
Centromere Ab Screen: <0.2 AI (ref 0.0–0.9)
Chromatin Ab SerPl-aCnc: <0.2 AI (ref 0.0–0.9)
ENA RNP Ab: 2 AI — ABNORMAL HIGH (ref 0.0–0.9)
ENA SM Ab Ser-aCnc: <0.2 AI (ref 0.0–0.9)
ENA SSA (RO) Ab: <0.2 AI (ref 0.0–0.9)
ENA SSB (LA) Ab: <0.2 AI (ref 0.0–0.9)
Scleroderma (Scl-70) (ENA) Antibody, IgG: <0.2 AI (ref 0.0–0.9)
dsDNA Ab: <1 IU/mL (ref 0–9)

## 2022-09-09 LAB — C-REACTIVE PROTEIN: CRP: 4 mg/L (ref 0–10)

## 2022-09-09 LAB — CK: Total CK: 217 U/L — ABNORMAL HIGH (ref 32–182)

## 2022-09-09 LAB — THYROID PANEL WITH TSH
Free Thyroxine Index: 2.1 (ref 1.2–4.9)
T3 Uptake Ratio: 29 % (ref 24–39)
T4, Total: 7.2 ug/dL (ref 4.5–12.0)
TSH: 2.53 u[IU]/mL (ref 0.450–4.500)

## 2022-09-09 LAB — SEDIMENTATION RATE: Sed Rate: 3 mm/h (ref 0–40)

## 2022-09-15 ENCOUNTER — Telehealth: Payer: Self-pay | Admitting: Adult Health

## 2022-09-15 NOTE — Telephone Encounter (Signed)
Pt states on the same day she had her ncs/emg there was blood work done.  Pt states the results appear to be concerning, she'd like a call to discuss blood work results.

## 2022-09-16 NOTE — Telephone Encounter (Signed)
Results showed positive ANA, low titer ENA RNP antibody, slight elevation of CPK 217, unknown clinical significance,  Result was sent through MyChart  Patient is advised to contact office for more question,

## 2022-09-23 ENCOUNTER — Ambulatory Visit (INDEPENDENT_AMBULATORY_CARE_PROVIDER_SITE_OTHER): Payer: Medicare Other | Admitting: Dermatology

## 2022-09-23 ENCOUNTER — Encounter: Payer: Self-pay | Admitting: Dermatology

## 2022-09-23 DIAGNOSIS — Z85828 Personal history of other malignant neoplasm of skin: Secondary | ICD-10-CM

## 2022-09-23 DIAGNOSIS — C44722 Squamous cell carcinoma of skin of right lower limb, including hip: Secondary | ICD-10-CM

## 2022-09-23 DIAGNOSIS — D492 Neoplasm of unspecified behavior of bone, soft tissue, and skin: Secondary | ICD-10-CM | POA: Diagnosis not present

## 2022-09-23 DIAGNOSIS — L578 Other skin changes due to chronic exposure to nonionizing radiation: Secondary | ICD-10-CM | POA: Diagnosis not present

## 2022-09-23 DIAGNOSIS — W908XXA Exposure to other nonionizing radiation, initial encounter: Secondary | ICD-10-CM

## 2022-09-23 DIAGNOSIS — C4492 Squamous cell carcinoma of skin, unspecified: Secondary | ICD-10-CM

## 2022-09-23 DIAGNOSIS — C44729 Squamous cell carcinoma of skin of left lower limb, including hip: Secondary | ICD-10-CM | POA: Diagnosis not present

## 2022-09-23 DIAGNOSIS — L570 Actinic keratosis: Secondary | ICD-10-CM | POA: Diagnosis not present

## 2022-09-23 HISTORY — DX: Squamous cell carcinoma of skin, unspecified: C44.92

## 2022-09-23 NOTE — Patient Instructions (Signed)
Cryotherapy Aftercare  Wash gently with soap and water everyday.   Apply Vaseline and Band-Aid daily until healed.    Wound Care Instructions  Cleanse wound gently with soap and water once a day then pat dry with clean gauze. Apply a thin coat of Petrolatum (petroleum jelly, "Vaseline") over the wound (unless you have an allergy to this). We recommend that you use a new, sterile tube of Vaseline. Do not pick or remove scabs. Do not remove the yellow or white "healing tissue" from the base of the wound.  Cover the wound with fresh, clean, nonstick gauze and secure with paper tape. You may use Band-Aids in place of gauze and tape if the wound is small enough, but would recommend trimming much of the tape off as there is often too much. Sometimes Band-Aids can irritate the skin.  You should call the office for your biopsy report after 1 week if you have not already been contacted.  If you experience any problems, such as abnormal amounts of bleeding, swelling, significant bruising, significant pain, or evidence of infection, please call the office immediately.  FOR ADULT SURGERY PATIENTS: If you need something for pain relief you may take 1 extra strength Tylenol (acetaminophen) AND 2 Ibuprofen (200mg  each) together every 4 hours as needed for pain. (do not take these if you are allergic to them or if you have a reason you should not take them.) Typically, you may only need pain medication for 1 to 3 days.      Recommend daily broad spectrum sunscreen SPF 30+ to sun-exposed areas, reapply every 2 hours as needed. Call for new or changing lesions.  Staying in the shade or wearing long sleeves, sun glasses (UVA+UVB protection) and wide brim hats (4-inch brim around the entire circumference of the hat) are also recommended for sun protection.     Due to recent changes in healthcare laws, you may see results of your pathology and/or laboratory studies on MyChart before the doctors have had a  chance to review them. We understand that in some cases there may be results that are confusing or concerning to you. Please understand that not all results are received at the same time and often the doctors may need to interpret multiple results in order to provide you with the best plan of care or course of treatment. Therefore, we ask that you please give [name] 2 business days to thoroughly review all your results before contacting the office for clarification. Should we see a critical lab result, you will be contacted sooner.   If You Need Anything After Your Visit  If you have any questions or concerns for your doctor, please call our main line at [phone] and press option 4 to reach your doctor's medical assistant. If no one answers, please leave a voicemail as directed and we will return your call as soon as possible. Messages left after 4 pm will be answered the following business day.   You may also send [name] a message via MyChart. We typically respond to MyChart messages within 1-2 business days.  For prescription refills, please ask your pharmacy to contact our office. Our fax number is [phone].  If you have an urgent issue when the clinic is closed that cannot wait until the next business day, you can page your doctor at the number below.    Please note that while we do our best to be available for urgent issues outside of office hours, we are not available 24/7.  If you have an urgent issue and are unable to reach [name], you may choose to seek medical care at your doctor's office, retail clinic, urgent care center, or emergency room.  If you have a medical emergency, please immediately call 911 or go to the emergency department.  Pager Numbers  - Dr. Gwen Pounds: [phone]  - Dr. Roseanne Reno: [phone]  - Dr. Katrinka Blazing: [phone]   In the event of inclement weather, please call our main line at [phone] for an update on the status of any delays or closures.  Dermatology  Medication Tips: Please keep the boxes that topical medications come in in order to help keep track of the instructions about where and how to use these. Pharmacies typically print the medication instructions only on the boxes and not directly on the medication tubes.   If your medication is too expensive, please contact our office at [phone] option 4 or send [name] a message through MyChart.   We are unable to tell what your co-pay for medications will be in advance as this is different depending on your insurance coverage. However, we may be able to find a substitute medication at lower cost or fill out paperwork to get insurance to cover a needed medication.   If a prior authorization is required to get your medication covered by your insurance company, please allow [name] 1-2 business days to complete this process.  Drug prices often vary depending on where the prescription is filled and some pharmacies may offer cheaper prices.  The website www.goodrx.com contains coupons for medications through different pharmacies. The prices here do not account for what the cost may be with help from insurance (it may be cheaper with your insurance), but the website can give you the price if you did not use any insurance.  - You can print the associated coupon and take it with your prescription to the pharmacy.  - You may also stop by our office during regular business hours and pick up a GoodRx coupon card.  - If you need your prescription sent electronically to a different pharmacy, notify our office through [facility] or by phone at [phone] option 4.     Si Usted Necesita Algo Despus de Su Visita  Tambin puede enviarnos un mensaje a travs de Clinical cytogeneticist. Por lo general respondemos a los mensajes de MyChart en el transcurso de 1 a 2 das hbiles.  Para renovar recetas, por favor pida a su farmacia que se ponga en contacto con nuestra oficina. Annie Sable de fax es [facility] [place] [phone].  Si  tiene un asunto urgente cuando la clnica est cerrada y que no puede esperar hasta el siguiente da hbil, puede llamar/localizar a su doctor(a) al nmero que aparece a continuacin.   Por favor, tenga en cuenta que aunque hacemos todo lo posible para estar disponibles para asuntos urgentes fuera del horario de [place], no estamos disponibles las 24 horas del da, los 7 [address]  [address] de la [place].   Si tiene un problema urgente y no puede comunicarse con nosotros, puede optar por buscar atencin mdica  en el consultorio de su doctor(a), en una clnica privada, en un centro de atencin urgente o en una sala de emergencias.  Si tiene Engineer, drilling, por favor llame inmediatamente al 911 o vaya a la sala de emergencias.  Nmeros de bper  - Dr. Gwen Pounds: [phone]  - Dra. Roseanne Reno: [phone]  - Dr. Katrinka Blazing: [phone]   En caso de inclemencias del tiempo, por favor llame a Ferne Coe  lnea principal al [phone] para una actualizacin sobre el [place] de cualquier retraso o cierre.  Consejos para la medicacin en dermatologa: Por favor, guarde las cajas en las que vienen los medicamentos de uso tpico para ayudarle a seguir las instrucciones sobre dnde y cmo usarlos. Las farmacias generalmente imprimen las instrucciones del medicamento slo en las cajas y no directamente en los tubos del [place].   Si su medicamento es muy caro, por favor, pngase en contacto con Rolm [place]ala llamando al [phone] y presione la opcin 4 o envenos un mensaje a travs de Clinical cytogeneticist.   No podemos decirle cul ser su copago por los medicamentos por adelantado ya que esto es diferente dependiendo de la cobertura de su seguro. Sin embargo, es posible que podamos encontrar un medicamento sustituto a Audiological scientist un formulario para que el seguro cubra el medicamento que se considera necesario.   Si se requiere una autorizacin previa para que su compaa de seguros [place] su medicamento, por  favor permtanos de 1 a 2 das hbiles para completar [address]  Los precios de los medicamentos varan con frecuencia dependiendo del Environmental consultant de dnde se surte la receta y alguna farmacias pueden ofrecer precios ms baratos.  El sitio web www.goodrx.com tiene cupones para medicamentos de Health and safety inspector. Los precios aqu no tienen en cuenta lo que podra costar con la ayuda del seguro (puede ser ms barato con su seguro), pero el sitio web puede darle el precio si no utiliz Tourist information centre manager.  - Puede imprimir el cupn correspondiente y llevarlo con su receta a la farmacia.  - Tambin puede pasar por nuestra oficina durante el horario de atencin regular y Education officer, museum una tarjeta de cupones de GoodRx.  - Si necesita que su receta se enve electrnicamente a una farmacia diferente, informe a nuestra oficina a travs de MyChart de [place] o por telfono llamando al [phone] y presione la opcin 4.

## 2022-09-23 NOTE — Progress Notes (Unsigned)
New Patient Visit   Subjective  Angela [name] is a 77 y.o. female who presents for the following: Spots on legs. 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 She would like these lesions addressed today and she will schedule an appointment for TBSE.   The patient has spots, moles and lesions to be evaluated, some may be new or changing and the patient may have concern these could be cancer.    The following portions of the chart were reviewed this encounter and updated as appropriate: medications, allergies, medical history  Review of Systems:  No other skin or systemic complaints except as noted in HPI or Assessment and Plan.  Objective  Well appearing patient in no apparent distress; mood and affect are within normal limits.  A focused examination was performed of the following areas: Legs  Relevant physical exam findings are noted in the Assessment and Plan.  Right Lateral Knee Superior 1.5 cm erythematous firm scaly nodule        Left Middle Medial Pretibial 1.2 cm erythematous firm scaly papule       Left middle pretibial x1 Erythematous thin papules/macules with gritty scale.     Assessment & Plan   Neoplasm of skin (2) Right Lateral Knee Superior  Epidermal / dermal shaving  Lesion diameter (cm):  1.5 Informed consent: discussed and consent obtained   Timeout: patient name, date of birth, surgical site, and procedure verified   Procedure prep:  Patient was prepped and draped in usual sterile fashion Prep type:  Isopropyl alcohol Anesthesia: the lesion was anesthetized in a standard fashion   Anesthetic:  1% lidocaine w/ epinephrine 1-100,000 buffered w/ 8.4% NaHCO3 Instrument used: flexible razor blade   Hemostasis achieved with: pressure, aluminum chloride and electrodesiccation   Outcome: patient tolerated procedure well   Post-procedure details: sterile dressing applied and wound care instructions given   Dressing type: bandage and petrolatum     Destruction of lesion Complexity: extensive   Destruction method: electrodesiccation and curettage   Informed consent: discussed and consent obtained   Timeout:  patient name, date of birth, surgical site, and procedure verified Procedure prep:  Patient was prepped and draped in usual sterile fashion Prep type:  Isopropyl alcohol Anesthesia: the lesion was anesthetized in a standard fashion   Anesthetic:  1% lidocaine w/ epinephrine 1-100,000 buffered w/ 8.4% NaHCO3 Curettage performed in three different directions: Yes   Electrodesiccation performed over the curetted area: Yes   Curettage cycles:  3 Final wound size (cm):  1.5 Hemostasis achieved with:  pressure and aluminum chloride Outcome: patient tolerated procedure well with no complications   Post-procedure details: sterile dressing applied and wound care instructions given   Dressing type: bandage and petrolatum    Specimen 1 - Surgical pathology Differential Diagnosis: R/O SCC  Check Margins: No  EDC Today  Left Middle Medial Pretibial  Epidermal / dermal shaving  Lesion diameter (cm):  1.2 Informed consent: discussed and consent obtained   Timeout: patient name, date of birth, surgical site, and procedure verified   Procedure prep:  Patient was prepped and draped in usual sterile fashion Prep type:  Isopropyl alcohol Anesthesia: the lesion was anesthetized in a standard fashion   Anesthetic:  1% lidocaine w/ epinephrine 1-100,000 buffered w/ 8.4% NaHCO3 Instrument used: flexible razor blade   Hemostasis achieved with: pressure, aluminum chloride and electrodesiccation   Outcome: patient tolerated procedure well   Post-procedure details: sterile dressing applied and wound care instructions given   Dressing type:  bandage and petrolatum    Destruction of lesion Complexity: extensive   Destruction method: electrodesiccation and curettage   Informed consent: discussed and consent obtained   Timeout:  patient  name, date of birth, surgical site, and procedure verified Procedure prep:  Patient was prepped and draped in usual sterile fashion Prep type:  Isopropyl alcohol Anesthesia: the lesion was anesthetized in a standard fashion   Anesthetic:  1% lidocaine w/ epinephrine 1-100,000 buffered w/ 8.4% NaHCO3 Curettage performed in three different directions: Yes   Electrodesiccation performed over the curetted area: Yes   Curettage cycles:  3 Final wound size (cm):  1.2 Hemostasis achieved with:  pressure and aluminum chloride Outcome: patient tolerated procedure well with no complications   Post-procedure details: sterile dressing applied and wound care instructions given   Dressing type: bandage and petrolatum    Specimen 2 - Surgical pathology Differential Diagnosis: R/O SCC  Check Margins: No  EDC Today  AK (actinic keratosis) Left middle pretibial x1  Actinic keratoses are precancerous spots that appear secondary to cumulative UV radiation exposure/sun exposure over time. They are chronic with expected duration over 1 year. A portion of actinic keratoses will progress to squamous cell carcinoma of the skin. It is not possible to reliably predict which spots will progress to skin cancer and so treatment is recommended to prevent development of skin cancer.  Recommend daily broad spectrum sunscreen SPF 30+ to sun-exposed areas, reapply every 2 hours as needed.  Recommend staying in the shade or wearing long sleeves, sun glasses (UVA+UVB protection) and wide brim hats (4-inch brim around the entire circumference of the hat). Call for new or changing lesions.  ACTINIC DAMAGE - chronic, secondary to cumulative UV radiation exposure/sun exposure over time - diffuse scaly erythematous macules with underlying dyspigmentation - Recommend daily broad spectrum sunscreen SPF 30+ to sun-exposed areas, reapply every 2 hours as needed.  - Recommend staying in the shade or wearing long sleeves, sun  glasses (UVA+UVB protection) and wide brim hats (4-inch brim around the entire circumference of the hat). - Call for new or changing lesions.   Destruction of lesion - Left middle pretibial x1 Complexity: simple   Destruction method: cryotherapy   Informed consent: discussed and consent obtained   Timeout:  patient name, date of birth, surgical site, and procedure verified Lesion destroyed using liquid nitrogen: Yes   Region frozen until ice ball extended beyond lesion: Yes   Outcome: patient tolerated procedure well with no complications   Post-procedure details: wound care instructions given   Additional details:  Prior to procedure, discussed risks of blister formation, small wound, skin dyspigmentation, or rare scar following cryotherapy. Recommend Vaseline ointment to treated areas while healing.      Return in about 4 months (around 01/23/2023) for TBSE.  I, Lawson Radar, CMA, am acting as scribe for Armida Sans, MD.   Documentation: I have reviewed the above documentation for accuracy and completeness, and I agree with the above.  Armida Sans, MD

## 2022-09-25 ENCOUNTER — Encounter: Payer: Self-pay | Admitting: Dermatology

## 2022-09-27 LAB — SURGICAL PATHOLOGY

## 2022-09-28 ENCOUNTER — Telehealth: Payer: Self-pay

## 2022-09-28 NOTE — Telephone Encounter (Signed)
Patient advised of BX results. aw

## 2022-09-28 NOTE — Telephone Encounter (Signed)
Left pt msg to call for bx results/sh

## 2022-09-28 NOTE — Telephone Encounter (Signed)
-----   Message from Armida Sans sent at 09/28/2022 11:00 AM EDT ----- FINAL DIAGNOSIS        1. Skin, right lateral knee superior :       WELL DIFFERENTIATED SQUAMOUS CELL CARCINOMA        2. Skin, left middle medial pretibial :       WELL DIFFERENTIATED SQUAMOUS CELL CARCINOMA   1,2 - both Cancer = SCC Both already treated Recheck next visit

## 2022-09-29 ENCOUNTER — Ambulatory Visit: Payer: Self-pay | Admitting: Cardiology

## 2022-10-06 DIAGNOSIS — B001 Herpesviral vesicular dermatitis: Secondary | ICD-10-CM | POA: Diagnosis not present

## 2022-10-12 DIAGNOSIS — H0014 Chalazion left upper eyelid: Secondary | ICD-10-CM | POA: Diagnosis not present

## 2022-10-15 DIAGNOSIS — Z23 Encounter for immunization: Secondary | ICD-10-CM | POA: Diagnosis not present

## 2022-11-16 ENCOUNTER — Ambulatory Visit (INDEPENDENT_AMBULATORY_CARE_PROVIDER_SITE_OTHER): Payer: Medicare Other | Admitting: Physician Assistant

## 2022-11-16 ENCOUNTER — Encounter: Payer: Self-pay | Admitting: Physician Assistant

## 2022-11-16 DIAGNOSIS — M19011 Primary osteoarthritis, right shoulder: Secondary | ICD-10-CM | POA: Insufficient documentation

## 2022-11-16 HISTORY — DX: Primary osteoarthritis, right shoulder: M19.011

## 2022-11-16 NOTE — Addendum Note (Signed)
Addended by: Polly Cobia on: 11/16/2022 03:21 PM   Modules accepted: Orders

## 2022-11-16 NOTE — Progress Notes (Signed)
Office Visit Note   Patient: Angela [name]           Date of Birth: 1945/10/11           MRN: [id number] Visit Date: 11/16/2022              Requested by: Irena Reichmann, DO [address] [place],  [place] [address] PCP: Irena Reichmann, DO  No chief complaint on file.     HPI: Angela [name] is a pleasant 77 year old woman with a chief complaint of right shoulder pain.  This has been going on for 3 years.  Some of her symptoms seem to indicate a cervical pathology but she has had an MRI as well as electrodiagnostic studies of her right upper extremity as well as injections and has not really gotten any relief.  She did have glenohumeral injections starting with [name] and then Dr. Shon Baton and did get relief but of decreasing value.  She now is requesting an MRI of her shoulder she continues to have moderate plus pa I had a long just  Assessment & Plan: Visit Diagnoses:  1. Primary osteoarthritis, right shoulder     Plan: I had a long discussion with Angela [name] today.  Certainly she has some findings in her neck but has not gotten any relief.  She is quite stiff with limited range of motion of her shoulder.  She has known osteoarthritis of the shoulder.  She last had an MRI in 2022.  I would like to refer her to see Dr. August Saucer.  Will contact him and his medical assistant to see if they want any studies prior to her seeing him she is very pleased with this result as she really thinks this is from her shoulder  Follow-Up Instructions: With Dr. Johnell Comings Exam  Patient is alert, oriented, no adenopathy, well-dressed, normal affect, normal respiratory effort. Right shoulder she has very limited range of motion significant pain with external rotation sensation is intact she holds her arm straight down by her side with little movement because of comfort.  She can elevate to about 90 degrees no further and again external rotation is exquisitely painful she cannot internally rotate behind  her back  Imaging: No results found. No images are attached to the encounter.  Labs: Lab Results  Component Value Date   ESRSEDRATE 3 09/08/2022   ESRSEDRATE 2 01/19/2016   CRP 4 09/08/2022   CRP 1.9 01/19/2016   REPTSTATUS 07/19/2010 FINAL 07/17/2010   REPTSTATUS 07/20/2010 FINAL 07/17/2010   REPTSTATUS 07/22/2010 FINAL 07/17/2010   REPTSTATUS 07/22/2010 FINAL 07/17/2010   GRAMSTAIN  07/17/2010    NO WBC SEEN NO SQUAMOUS EPITHELIAL CELLS SEEN NO ORGANISMS SEEN   GRAMSTAIN  07/17/2010    ABUNDANT WBC PRESENT, PREDOMINANTLY PMN NO SQUAMOUS EPITHELIAL CELLS SEEN NO ORGANISMS SEEN   GRAMSTAIN  07/17/2010    NO WBC SEEN NO SQUAMOUS EPITHELIAL CELLS SEEN NO ORGANISMS SEEN   GRAMSTAIN  07/17/2010    ABUNDANT WBC PRESENT, PREDOMINANTLY PMN NO SQUAMOUS EPITHELIAL CELLS SEEN NO ORGANISMS SEEN   CULT NO GROWTH 2 DAYS 07/17/2010   CULT  07/17/2010    FEW GROUP B STREP(S.AGALACTIAE)ISOLATED Note: TESTING AGAINST S. AGALACTIAE NOT ROUTINELY PERFORMED DUE TO PREDICTABILITY OF AMP/PEN/VAN SUSCEPTIBILITY.   CULT NO ANAEROBES ISOLATED 07/17/2010   CULT NO ANAEROBES ISOLATED 07/17/2010     Lab Results  Component Value Date   ALBUMIN 4.2 08/03/2019   ALBUMIN 3.3 (L) 03/05/2016   ALBUMIN 3.6 07/17/2010  No results found for: "MG" No results found for: "VD25OH"  No results found for: "PREALBUMIN"    Latest Ref Rng & Units 10/07/2019    9:50 PM 08/03/2019    9:51 AM 05/06/2019    7:30 PM  CBC EXTENDED  WBC 4.0 - 10.5 K/uL 16.6  4.4  7.9   RBC 3.87 - 5.11 MIL/uL 4.20  4.01  3.66   Hemoglobin 12.0 - 15.0 g/dL 23.5  57.3  22.0   HCT 36.0 - 46.0 % 42.3  39.4  36.5   Platelets 150 - 400 K/uL 312  288  234   NEUT# 1.7 - 7.7 K/uL   6.1   Lymph# 0.7 - 4.0 K/uL   1.0      There is no height or weight on file to calculate BMI.  Orders:  No orders of the defined types were placed in this encounter.  No orders of the defined types were placed in this encounter.     Procedures: No procedures performed  Clinical Data: No additional findings.  ROS:  All other systems negative, except as noted in the HPI. Review of Systems  Objective: Vital Signs: There were no vitals taken for this visit.  Specialty Comments:  MRI CERVICAL SPINE WITHOUT CONTRAST   TECHNIQUE: Multiplanar, multisequence MR imaging of the cervical spine was performed. No intravenous contrast was administered.   COMPARISON:  Radiography 10/31/2020.  MRI 09/19/2019.   FINDINGS: Alignment: No malalignment.   Vertebrae: No fracture or focal bone lesion. Mild discogenic endplate changes at C5-6 and C6-7.   Cord: No cord compression or focal cord lesion.   Posterior Fossa, vertebral arteries, paraspinal tissues: Negative   Disc levels:   Foramen magnum is widely patent.  C1-2 and C2-3 are normal.   C3-4: Bulging of the disc with uncovertebral prominence, right worse than left. No compressive canal stenosis. Proximal foraminal narrowing right more than left. Some potential the right C4 nerve could be affected. Slight worsening since 2021.   C4-5: Endplate osteophytes and bulging of the disc. Narrowing of the ventral subarachnoid space but no compression of the cord. Mild proximal foraminal narrowing, not likely compressive. No apparent change.   C5-6: Endplate osteophytes and bulging of the disc. Mild narrowing of the subarachnoid space but no compressive effect upon the cord. Mild proximal foraminal narrowing, not likely compressive. No change.   C6-7: Endplate osteophytes and bulging of the disc. Narrowing of the ventral subarachnoid space but no compressive effect upon the cord. Mild proximal foraminal narrowing, not likely compressive. No change.   C7-T1: Normal interspace.   IMPRESSION: Multilevel cervical spondylosis as outlined above. No compressive stenosis of the central canal. At C3-4, there is uncovertebral disease more prominent on the right than the  left with proximal foraminal encroachment that could possibly affect in particular the right C4 nerve. This has worsened slightly since 2021.   The degenerative changes otherwise at C4-5, C5-6 and C6-7 appear stable since the prior exam without apparent neural compression.     Electronically Signed   By: Paulina Fusi M.D.   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   Chronic pain syndrome 07/25/2019   Irritable  bowel syndrome 07/25/2019   Urethral caruncle 07/25/2019   Thyroid dysfunction 07/25/2019   Osteoporosis 07/09/2019   Hyperprolactinemia (HCC) 07/09/2019   Trochanteric bursitis, left hip 06/28/2017   Chronic, continuous use of opioids 10/12/2016   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/12/2016   Other retention of urine 10/12/2016   Vaginal atrophy 10/12/2016   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   New onset headache 01/19/2016   Excessive sleepiness 01/19/2016   HYPERTENSION, BENIGN 03/10/2009   HYPERTENSION, UNCONTROLLED 01/29/2009   ARM PAIN, LEFT 01/29/2009   Past Medical History:  Diagnosis Date   Cancer (HCC)    skin   Chronic pain    Complication of anesthesia    COVID 2023   Difficult intubation    19 jaw/TMJ surgeries with "bone fused to skull" leading to very limited mouth opening   Headache    Hypertension    Hypothyroidism    Joint disorder    Mitral valve prolapse    Pneumonia    PONV (postoperative nausea and vomiting)    Porphyria (HCC)    "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)   Sleep apnea    Squamous cell carcinoma of skin 09/23/2022   Left Middle Medial Pretibial,  EDC   Squamous cell carcinoma of skin 09/23/2022   Right Lateral Knee Superior, EDC    Family History  Problem Relation Age of Onset   Breast cancer Mother    Rheum arthritis Mother    Aneurysm Father    Heart disease Father    Throat cancer Father    Melanoma Father    Heart disease Brother    Heart disease Brother    Thyroid disease Brother    Post-traumatic stress disorder Brother    Rheum arthritis Brother    Polymyositis Brother    Obesity Brother     Past Surgical History:  Procedure Laterality Date   ABDOMINAL HYSTERECTOMY     Age 42   ANKLE SURGERY     x3   APPENDECTOMY     BIOPSY  10/19/2018   Procedure: BIOPSY;  Surgeon: Kerin Salen, MD;  Location: [facility];  Service: Gastroenterology;;   BIOPSY  04/11/2020   Procedure: BIOPSY;  Surgeon: Kerin Salen, MD;  Location: [facility];  Service: Gastroenterology;;   COLONOSCOPY WITH PROPOFOL N/A 10/19/2018   Procedure: COLONOSCOPY WITH PROPOFOL;  Surgeon: Kerin Salen, MD;  Location: [facility];  Service: Gastroenterology;  Laterality: N/A;   COLONOSCOPY WITH PROPOFOL N/A 04/11/2020   Procedure: COLONOSCOPY WITH PROPOFOL;  Surgeon: Kerin Salen, MD;  Location: [facility];  Service: Gastroenterology;  Laterality: N/A;  (difficulty airway)    DILATION AND CURETTAGE OF UTERUS     x 4   ESOPHAGOGASTRODUODENOSCOPY (EGD) WITH PROPOFOL N/A 10/19/2018   Procedure: ESOPHAGOGASTRODUODENOSCOPY (EGD) WITH PROPOFOL;  Surgeon: Kerin Salen, MD;  Location: [facility];  Service: Gastroenterology;  Laterality: N/A;   FRACTURE SURGERY     jaw   KNEE SURGERY     x 3   KYPHOPLASTY N/A 08/07/2019   Procedure: Thoracic twelve Vertebral Augmentation with Stenting/SpineJack;  Surgeon: Bedelia Person, MD;  Location[facility];  Service: Neurosurgery;  Laterality: N/A;   MANDIBLE FRACTURE SURGERY     x 19   POLYPECTOMY  10/19/2018   Procedure: POLYPECTOMY;  Surgeon: Kerin Salen, MD;  Location: [facility];  Service: Gastroenterology;;    POLYPECTOMY  04/11/2020   Procedure: POLYPECTOMY;  Surgeon: Kerin Salen, MD;  Location: [facility];  Service: Gastroenterology;;   Social History   Occupational History  Occupation: Part-time Investment banker, corporate  Tobacco Use   Smoking status: Former   Smokeless tobacco: Never   Tobacco comments:    Quit January 1980  Vaping Use   Vaping status: Never Used  Substance and Sexual Activity   Alcohol use: Not Currently    Alcohol/week: 0.0 - 5.0 standard drinks of alcohol   Drug use: No   Sexual activity: Not on file

## 2022-11-22 ENCOUNTER — Encounter (INDEPENDENT_AMBULATORY_CARE_PROVIDER_SITE_OTHER): Payer: Self-pay | Admitting: Otolaryngology

## 2022-12-05 ENCOUNTER — Other Ambulatory Visit: Payer: Self-pay | Admitting: Neurology

## 2022-12-06 ENCOUNTER — Other Ambulatory Visit: Payer: Self-pay

## 2022-12-06 ENCOUNTER — Ambulatory Visit (INDEPENDENT_AMBULATORY_CARE_PROVIDER_SITE_OTHER): Payer: Medicare Other | Admitting: Orthopedic Surgery

## 2022-12-06 ENCOUNTER — Encounter: Payer: Self-pay | Admitting: Orthopedic Surgery

## 2022-12-06 DIAGNOSIS — M19011 Primary osteoarthritis, right shoulder: Secondary | ICD-10-CM

## 2022-12-07 ENCOUNTER — Encounter: Payer: Self-pay | Admitting: Orthopedic Surgery

## 2022-12-07 MED ORDER — METHYLPREDNISOLONE ACETATE 40 MG/ML IJ SUSP
40.0000 mg | INTRAMUSCULAR | Status: AC | PRN
  Administered 2022-12-06: 40 mg via INTRA_ARTICULAR

## 2022-12-07 MED ORDER — BUPIVACAINE HCL 0.5 % IJ SOLN
9.0000 mL | INTRAMUSCULAR | Status: AC | PRN
  Administered 2022-12-06: 9 mL via INTRA_ARTICULAR

## 2022-12-07 MED ORDER — LIDOCAINE HCL 1 % IJ SOLN
5.0000 mL | INTRAMUSCULAR | Status: AC | PRN
  Administered 2022-12-06: 5 mL

## 2022-12-07 NOTE — Progress Notes (Signed)
Office Visit Note   Patient: Angela [name]           Date of Birth: 12-03-1945           MRN: [id number] Visit Date: 12/06/2022 Requested by: Persons, [place] [address] [place],  [place] [address] PCP: Irena Reichmann, DO  Subjective: Chief Complaint  Patient presents with   Right Shoulder - Pain    HPI: Angela [name] is a 77 y.o. female who presents to the office reporting right shoulder pain for years.  She had an injection on 05/07/2022 which did not help.  Prior injections have helped her.  She was told she needed a replacement years ago.  Injections then used to help her shoulder.  She reports pain radiating from the neck down the arm as well as into the shoulder.  Had an injection with [name] about 2 years ago which helped her for a year.  She has also had a cervical spine ESI which did not help.  She has bone-on-bone arthritis in her shoulder along with cervical spine findings consistent with possible radiculopathy in the neck..                ROS: All systems reviewed are negative as they relate to the chief complaint within the history of present illness.  Patient denies fevers or chills.  Assessment & Plan: Visit Diagnoses:  1. Primary osteoarthritis, right shoulder     Plan: Impression is right shoulder pain with reasonable range of motion and strength but significant pain and arthritis.  I think she also has a contribution of her neck to this problem.  She wants to avoid shoulder replacement surgery "at all cost".  Plan for ultrasound-guided injection into the right glenohumeral joint today.  We did get about 15 cc of numbing medicine in the joint for anesthetic effect for the first day along with 1 cc of cortisone.  If that helps we could repeated in 4 to 6 months.  Alternatively if no relief is gained from this injection I think we should really focus on her neck.  Follow-Up Instructions: No follow-ups on file.   Orders:  Orders Placed This Encounter   Procedures   US Guided Needle Placement - No Linked Charges   No orders of the defined types were placed in this encounter.     Procedures: Large Joint Inj: R glenohumeral on 12/06/2022 9:13 AM Indications: diagnostic evaluation and pain Details: 22 G 1.5 in needle, ultrasound-guided posterior approach  Arthrogram: No  Medications: 9 mL bupivacaine 0.5 %; 40 mg methylPREDNISolone acetate 40 MG/ML; 5 mL lidocaine 1 % Outcome: tolerated well, no immediate complications Procedure, treatment alternatives, risks and benefits explained, specific risks discussed. Consent was given by the patient. Immediately prior to procedure a time out was called to verify the correct patient, procedure, equipment, support staff and site/side marked as required. Patient was prepped and draped in the usual sterile fashion.       Clinical Data: No additional findings.  Objective: Vital Signs: There were no vitals taken for this visit.  Physical Exam:  Constitutional: Patient appears well-developed HEENT:  Head: Normocephalic Eyes:EOM are normal Neck: Normal range of motion Cardiovascular: Normal rate Pulmonary/chest: Effort normal Neurologic: Patient is alert Skin: Skin is warm Psychiatric: Patient has normal mood and affect  Ortho Exam: Ortho exam demonstrates functional deltoid with pretty reasonable rotator cuff strength infraspinatus supraspinatus and subscap muscle testing.  Mild pain with flexion extension and rotation of the cervical  spine.  5 out of 5 grip EPL FPL interosseous wrist flexion extension bicep triceps and deltoid strength.  Passive range of motion is 60/85/135 on the right.  No discrete AC joint tenderness.  Specialty Comments:  MRI CERVICAL SPINE WITHOUT CONTRAST   TECHNIQUE: Multiplanar, multisequence MR imaging of the cervical spine was performed. No intravenous contrast was administered.   COMPARISON:  Radiography 10/31/2020.  MRI 09/19/2019.    FINDINGS: Alignment: No malalignment.   Vertebrae: No fracture or focal bone lesion. Mild discogenic endplate changes at C5-6 and C6-7.   Cord: No cord compression or focal cord lesion.   Posterior Fossa, vertebral arteries, paraspinal tissues: Negative   Disc levels:   Foramen magnum is widely patent.  C1-2 and C2-3 are normal.   C3-4: Bulging of the disc with uncovertebral prominence, right worse than left. No compressive canal stenosis. Proximal foraminal narrowing right more than left. Some potential the right C4 nerve could be affected. Slight worsening since 2021.   C4-5: Endplate osteophytes and bulging of the disc. Narrowing of the ventral subarachnoid space but no compression of the cord. Mild proximal foraminal narrowing, not likely compressive. No apparent change.   C5-6: Endplate osteophytes and bulging of the disc. Mild narrowing of the subarachnoid space but no compressive effect upon the cord. Mild proximal foraminal narrowing, not likely compressive. No change.   C6-7: Endplate osteophytes and bulging of the disc. Narrowing of the ventral subarachnoid space but no compressive effect upon the cord. Mild proximal foraminal narrowing, not likely compressive. No change.   C7-T1: Normal interspace.   IMPRESSION: Multilevel cervical spondylosis as outlined above. No compressive stenosis of the central canal. At C3-4, there is uncovertebral disease more prominent on the right than the left with proximal foraminal encroachment that could possibly affect in particular the right C4 nerve. This has worsened slightly since 2021.   The degenerative changes otherwise at C4-5, C5-6 and C6-7 appear stable since the prior exam without apparent neural compression.     Electronically Signed   By: Paulina Fusi M.D.   On: 11/17/2020 11:11  Imaging: US Guided Needle Placement - No Linked Charges  Result Date: 12/07/2022 Ultrasound imaging demonstrates needle  placement into the right glenohumeral joint with extravasation of fluid into the joint and no complicating features    PMFS History: Patient Active Problem List   Diagnosis Date Noted   Primary osteoarthritis, right shoulder 11/16/2022   Right arm pain 09/08/2022   Urinary urgency 09/08/2022   Weakness 09/08/2022   Other spondylosis with radiculopathy, cervical region 11/03/2020   OSA on CPAP 05/26/2020   Attention deficit hyperactivity disorder, predominantly inattentive type 07/25/2019   Chronic pain syndrome 07/25/2019   Irritable bowel syndrome 07/25/2019   Urethral caruncle 07/25/2019   Thyroid dysfunction 07/25/2019   Osteoporosis 07/09/2019   Hyperprolactinemia (HCC) 07/09/2019   Trochanteric bursitis, left hip 06/28/2017   Chronic, continuous use of opioids 10/12/2016   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/12/2016   Other retention of urine 10/12/2016   Vaginal atrophy 10/12/2016   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   New onset headache 01/19/2016   Excessive sleepiness 01/19/2016   HYPERTENSION, BENIGN 03/10/2009   HYPERTENSION, UNCONTROLLED 01/29/2009   ARM PAIN, LEFT 01/29/2009   Past Medical History:  Diagnosis Date   Cancer (HCC)    skin   Chronic pain    Complication of anesthesia    COVID 2023   Difficult intubation    19 jaw/TMJ surgeries with "bone fused to skull" leading to very  limited mouth opening   Headache    Hypertension    Hypothyroidism    Joint disorder    Mitral valve prolapse    Pneumonia    PONV (postoperative nausea and vomiting)    Porphyria (HCC)    "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)   Sleep apnea    Squamous cell carcinoma of skin 09/23/2022   Left Middle Medial Pretibial, ED[facility]   Squamous cell carcinoma of skin 09/23/2022   Right Lateral Knee Superior, ED[facility]    Family History  Problem Relation Age of Onset   Breast cancer Mother    Rheum arthritis Mother    Aneurysm Father    Heart disease Father     Throat cancer Father    Melanoma Father    Heart disease Brother    Heart disease Brother    Thyroid disease Brother    Post-traumatic stress disorder Brother    Rheum arthritis Brother    Polymyositis Brother    Obesity Brother     Past Surgical History:  Procedure Laterality Date   ABDOMINAL HYSTERECTOMY     Age 52   ANKLE SURGERY     x3   APPENDECTOMY     BIOPSY  10/19/2018   Procedure: BIOPSY;  Surgeon: Kerin Salen, MD;  Location: [facility];  Service: Gastroenterology;;   BIOPSY  04/11/2020   Procedure: BIOPSY;  Surgeon: Kerin Salen, MD;  Location: [facility];  Service: Gastroenterology;;   COLONOSCOPY WITH PROPOFOL N/A 10/19/2018   Procedure: COLONOSCOPY WITH PROPOFOL;  Surgeon: Kerin Salen, MD;  Location: [facility];  Service: Gastroenterology;  Laterality: N/A;   COLONOSCOPY WITH PROPOFOL N/A 04/11/2020   Procedure: COLONOSCOPY WITH PROPOFOL;  Surgeon: Kerin Salen, MD;  Location: [facility];  Service: Gastroenterology;  Laterality: N/A;  (difficulty airway)    DILATION AND CURETTAGE OF UTERUS     x 4   ESOPHAGOGASTRODUODENOSCOPY (EGD) WITH PROPOFOL N/A 10/19/2018   Procedure: ESOPHAGOGASTRODUODENOSCOPY (EGD) WITH PROPOFOL;  Surgeon: Kerin Salen, MD;  Location: [facility];  Service: Gastroenterology;  Laterality: N/A;   FRACTURE SURGERY     jaw   KNEE SURGERY     x 3   KYPHOPLASTY N/A 08/07/2019   Procedure: Thoracic twelve Vertebral Augmentation with Stenting/SpineJack;  Surgeon: Bedelia Person, MD;  [facility] OR;  Service: Neurosurgery;  Laterality: N/A;   MANDIBLE FRACTURE SURGERY     x 19   POLYPECTOMY  10/19/2018   Procedure: POLYPECTOMY;  Surgeon: Kerin Salen, MD;  Location: [facility];  Service: Gastroenterology;;   POLYPECTOMY  04/11/2020   Procedure: POLYPECTOMY;  Surgeon: Kerin Salen, MD;  Location: [facility];  Service: Gastroenterology;;   Social History   Occupational History   Occupation: Part-time Investment banker, corporate  Tobacco Use   Smoking  status: Former   Smokeless tobacco: Never   Tobacco comments:    Quit January 1980  Vaping Use   Vaping status: Never Used  Substance and Sexual Activity   Alcohol use: Not Currently    Alcohol/week: 0.0 - 5.0 standard drinks of alcohol   Drug use: No   Sexual activity: Not on file

## 2022-12-14 ENCOUNTER — Telehealth: Payer: Self-pay | Admitting: Orthopedic Surgery

## 2022-12-14 NOTE — Telephone Encounter (Signed)
Pt called requesting a call from Dr August Saucer. Pt states she had an injection but it did not help. Pt states pain are worse and need to know next plan of action. Please call pt at [phone].

## 2022-12-15 NOTE — Telephone Encounter (Signed)
I called and left message on her machine for her to call back.  Essentially she needs to decide if this is bad enough for shoulder replacement or if she wants more imaging on her neck with possible repeat epidural steroid injection.  It is always conceivable that she has had a change in her neck which is giving her whole arm symptoms.  Really cannot say for sure until she comes back again.  Would also like to know if she even got 2 or 3 hours of relief right after the injection while it was numb.  Requested that she call back or we could always see her at some point as well.  Potentially Friday afternoon

## 2022-12-16 ENCOUNTER — Telehealth: Payer: Self-pay | Admitting: Orthopedic Surgery

## 2022-12-16 DIAGNOSIS — M5412 Radiculopathy, cervical region: Secondary | ICD-10-CM

## 2022-12-16 NOTE — Telephone Encounter (Signed)
Pt called in stating August Saucer called her but she missed the call would like Dean to [facility] Friday morning, read Deans message to pt and she said she would like to look into the neck nerve issue more so than the shoulder. Pt stated pain in mainly closer to armpit area and injection gave no relief at all.

## 2022-12-17 NOTE — Telephone Encounter (Signed)
I called pls rf to fn for csp esi thx - no help with shoulder shot even 1 - 2 hrs after

## 2022-12-17 NOTE — Telephone Encounter (Signed)
Referral entered  

## 2022-12-17 NOTE — Addendum Note (Signed)
Addended by: Rogers Seeds on: 12/17/2022 03:03 PM   Modules accepted: Orders

## 2022-12-30 ENCOUNTER — Ambulatory Visit (INDEPENDENT_AMBULATORY_CARE_PROVIDER_SITE_OTHER): Payer: Medicare Other | Admitting: Otolaryngology

## 2022-12-30 ENCOUNTER — Encounter (INDEPENDENT_AMBULATORY_CARE_PROVIDER_SITE_OTHER): Payer: Self-pay

## 2022-12-30 VITALS — Wt 130.0 lb

## 2022-12-30 DIAGNOSIS — R221 Localized swelling, mass and lump, neck: Secondary | ICD-10-CM

## 2022-12-30 NOTE — Progress Notes (Signed)
Dear Dr. Lyda Perone, Here is my assessment for our mutual patient, Angela [name]. Thank you for allowing me the opportunity to care for your patient. Please do not hesitate to contact me should you have any other questions. Sincerely, Dr. Jovita Kussmaul  Otolaryngology Clinic Note Referring provider: Dr. Lyda Perone HPI:  Angela [name] is a 77 y.o. female kindly referred by Dr. Lyda Perone for evaluation of right neck lesion.  She reports that she felt a lump on her right submandibular neck, which she reports that was there in the summer. No problems with it. No pain, swelling, infection.  Patient otherwise denies: - dysphagia, odynophagia, unintentional weight loss - changes in voice, shortness of breath, hemoptysis - ear pain, neck masses  Had maybe a low grade ear ache in October but resolved. She does report multiple TMJ surgeries by oral surgeries (19 in total, last time in [place] - last time was in late 90s using Rib cartilage) for what sounds like TMJ arthropathy.   H&N Surgery: Multiple TMJ surgeries (last one in 90s) Personal or FHx of bleeding dz or anesthesia difficulty: no  AP/AC: no  PMHx: HTN, OSA on CPAP, Hypothyroidism, Chronic pain on buprenorphine  Tobacco: never. Alcohol: occassional wine. Occupation: Investment banker, corporate. Lives in [place], [place]  Independent Review of Additional Tests or Records:  Dr. Lyda Perone (11/15/2022): Right ear pain starting October, Right LAD x77months, ref to ENT MRI 06/28/2022 independent review: bilateral normal submandibular glands, no obvious other masses noted PMH/Meds/All/SocHx/FamHx/ROS:   Past Medical History:  Diagnosis Date   Cancer (HCC)    skin   Chronic pain    Complication of anesthesia    COVID 2023   Difficult intubation    19 jaw/TMJ surgeries with "bone fused to skull" leading to very limited mouth opening   Headache    Hypertension    Hypothyroidism    Joint disorder    Mitral valve prolapse    Pneumonia    PONV (postoperative nausea  and vomiting)    Porphyria (HCC)    "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)   Sleep apnea    Squamous cell carcinoma of skin 09/23/2022   Left Middle Medial Pretibial, EDC   Squamous cell carcinoma of skin 09/23/2022   Right Lateral Knee Superior, EDC     Past Surgical History:  Procedure Laterality Date   ABDOMINAL HYSTERECTOMY     Age 77   ANKLE SURGERY     x3   APPENDECTOMY     BIOPSY  10/19/2018   Procedure: BIOPSY;  Surgeon: Kerin Salen, MD;  Location: [facility];  Service: Gastroenterology;;   BIOPSY  04/11/2020   Procedure: BIOPSY;  Surgeon: Kerin Salen, MD;  Location: [facility];  Service: Gastroenterology;;   COLONOSCOPY WITH PROPOFOL N/A 10/19/2018   Procedure: COLONOSCOPY WITH PROPOFOL;  Surgeon: Kerin Salen, MD;  Location: [facility];  Service: Gastroenterology;  Laterality: N/A;   COLONOSCOPY WITH PROPOFOL N/A 04/11/2020   Procedure: COLONOSCOPY WITH PROPOFOL;  Surgeon: Kerin Salen, MD;  Location: [facility];  Service: Gastroenterology;  Laterality: N/A;  (difficulty airway)    DILATION AND CURETTAGE OF UTERUS     x 4   ESOPHAGOGASTRODUODENOSCOPY (EGD) WITH PROPOFOL N/A 10/19/2018   Procedure: ESOPHAGOGASTRODUODENOSCOPY (EGD) WITH PROPOFOL;  Surgeon: Kerin Salen, MD;  Location: [facility];  Service: Gastroenterology;  Laterality: N/A;   FRACTURE SURGERY     jaw   KNEE SURGERY     x 3   KYPHOPLASTY N/A 08/07/2019   Procedure: Thoracic twelve Vertebral Augmentation with  Stenting/SpineJack;  Surgeon: Bedelia Person, MD;  Location: [facility] OR;  Service: Neurosurgery;  Laterality: N/A;   MANDIBLE FRACTURE SURGERY     x 19   POLYPECTOMY  10/19/2018   Procedure: POLYPECTOMY;  Surgeon: Kerin Salen, MD;  Location: [facility];  Service: Gastroenterology;;   POLYPECTOMY  04/11/2020   Procedure: POLYPECTOMY;  Surgeon: Kerin Salen, MD;  Location: [facility];  Service: Gastroenterology;;    Family History  Problem Relation Age of Onset    Breast cancer Mother    Rheum arthritis Mother    Aneurysm Father    Heart disease Father    Throat cancer Father    Melanoma Father    Heart disease Brother    Heart disease Brother    Thyroid disease Brother    Post-traumatic stress disorder Brother    Rheum arthritis Brother    Polymyositis Brother    Obesity Brother      Social Connections: Unknown (05/23/2021)   Received [facility], [facility]   Social Network    Social Network: Not on file      Current Outpatient Medications:    amphetamine-dextroamphetamine (ADDERALL) 10 MG tablet, Take 1-2 tablets (10-20 mg total) by mouth daily with breakfast., Disp: 60 tablet, Rfl: 0   b complex vitamins capsule, Take 1 capsule by mouth daily., Disp: , Rfl:    buprenorphine (SUBUTEX) 8 MG SUBL, Place 4 mg under the tongue See admin instructions. Six times a day, Disp: , Rfl:    Cholecalciferol (VITAMIN D3) 125 MCG (5000 UT) TABS, Take 5,000 Units by mouth daily., Disp: , Rfl:    Cyanocobalamin (VITAMIN B-12) 5000 MCG SUBL, Place 5,000 mcg under the tongue daily., Disp: , Rfl:    levothyroxine (SYNTHROID) 75 MCG tablet, Take 75 mcg by mouth daily., Disp: , Rfl:    lisinopril-hydrochlorothiazide (ZESTORETIC) 10-12.5 MG tablet, Take 1 tablet by mouth daily., Disp: , Rfl:    potassium chloride (MICRO-K) 10 MEQ CR capsule, Take 10 mEq by mouth daily. , Disp: , Rfl:    valsartan-hydrochlorothiazide (DIOVAN-HCT) 160-25 MG tablet, Take 1 tablet by mouth daily., Disp: , Rfl:    zinc gluconate 50 MG tablet, Take 50 mg by mouth daily., Disp: , Rfl:    bisacodyl (DULCOLAX) 5 MG EC tablet, Take 5 mg by mouth daily as needed for moderate constipation. (Patient not taking: Reported on 12/30/2022), Disp: , Rfl:    celecoxib (CELEBREX) 50 MG capsule, TAKE 1 CAPSULE(50 MG) BY MOUTH TWICE DAILY AS NEEDED FOR PAIN, Disp: 60 capsule, Rfl: 3   DULoxetine (CYMBALTA) 30 MG capsule, Take 1 capsule (30 mg total) by mouth daily., Disp: 30 capsule, Rfl:  11   Estradiol 10 MCG TABS vaginal tablet, Place 10 mcg vaginally 3 (three) times a week., Disp: , Rfl:    furosemide (LASIX) 20 MG tablet, Take 20 mg by mouth daily as needed for fluid (fluid retention (feet/legs)). (Patient not taking: Reported on 12/30/2022), Disp: , Rfl: 0   methylPREDNISolone (MEDROL DOSEPAK) 4 MG TBPK tablet, Take per packet instructions. Taper dosing., Disp: 1 each, Rfl: 0   ondansetron (ZOFRAN) 8 MG tablet, Take 8 mg by mouth 2 (two) times daily as needed for nausea or vomiting.  (Patient not taking: Reported on 12/30/2022), Disp: , Rfl:    polyvinyl alcohol (LIQUIFILM TEARS) 1.4 % ophthalmic solution, Place 1 drop into both eyes 3 (three) times daily. (Patient not taking: Reported on 12/30/2022), Disp: , Rfl:    pregabalin (LYRICA) 75 MG capsule,  Take 1 capsule (75 mg total) by mouth 2 (two) times daily., Disp: 60 capsule, Rfl: 1   Physical Exam:   Wt 130 lb (59 kg)   BMI 20.98 kg/m   Salient findings:  CN II-XII intact  Bilateral EAC clear and TM intact with well pneumatized middle ear spaces Anterior rhinoscopy: Septum relatively midline; bilateral inferior turbinates without significant hypertrophy No lesions of oral cavity/oropharynx; dentition relatively poor; firm bilateral TMJ, likely post-surgical change 2/2 implants; expected 2 cm trismus No obviously palpable neck masses/lymphadenopathy/thyromegaly; the area she points to appears to be ptotic submandibular gland - also able to feel it on left No respiratory distress or stridor  Seprately Identifiable Procedures:  None  Impression & Plans:  Janautica Desantiago is a 76 y.o. female with:  1. Neck mass   Exam consistent with ptotic submandibular gland. Also noted on MRI 06/2022 without masses. She was reassured. I offered CT Neck but declined. Do not think pathologic  - f/u PRN, return precautions discussed  See below regarding exact medications prescribed this encounter including dosages and route: No  orders of the defined types were placed in this encounter.  Thank you for allowing me the opportunity to care for your patient. Please do not hesitate to contact me should you have any other questions.  Sincerely, Jovita Kussmaul, MD Otolarynoglogist (ENT), [facility] Phone: [phone] Fax: [phone]  12/30/2022, 2:05 PM   MDM:  Level 3 Complexity/Problems addressed: low Data complexity: mod - independent review of MRI - Morbidity: low  - Prescription Drug prescribed or managed: no

## 2023-01-03 ENCOUNTER — Telehealth: Payer: Self-pay | Admitting: Physical Medicine and Rehabilitation

## 2023-01-03 NOTE — Telephone Encounter (Signed)
Pt's daughter called stating pt has covid and need to cancel appt. Daughter is asking for Morrie Sheldon or Ladona Ridgel to call pt next week sometime to reschedule. Pt phone number is [phone].

## 2023-01-06 ENCOUNTER — Encounter: Payer: Medicare Other | Admitting: Physical Medicine and Rehabilitation

## 2023-02-09 ENCOUNTER — Ambulatory Visit: Payer: Medicare Other | Admitting: Dermatology

## 2023-02-22 ENCOUNTER — Emergency Department: Payer: Medicare Other

## 2023-02-22 ENCOUNTER — Other Ambulatory Visit: Payer: Self-pay

## 2023-02-22 ENCOUNTER — Encounter: Payer: Self-pay | Admitting: Emergency Medicine

## 2023-02-22 ENCOUNTER — Emergency Department
Admission: EM | Admit: 2023-02-22 | Discharge: 2023-02-22 | Disposition: A | Discharge status: Home / Self Care | Payer: Medicare Other | Attending: Emergency Medicine | Admitting: Emergency Medicine

## 2023-02-22 DIAGNOSIS — S6992XA Unspecified injury of left wrist, hand and finger(s), initial encounter: Secondary | ICD-10-CM | POA: Diagnosis present

## 2023-02-22 DIAGNOSIS — W19XXXA Unspecified fall, initial encounter: Secondary | ICD-10-CM

## 2023-02-22 DIAGNOSIS — S52572A Other intraarticular fracture of lower end of left radius, initial encounter for closed fracture: Secondary | ICD-10-CM | POA: Insufficient documentation

## 2023-02-22 DIAGNOSIS — W010XXA Fall on same level from slipping, tripping and stumbling without subsequent striking against object, initial encounter: Secondary | ICD-10-CM | POA: Insufficient documentation

## 2023-02-22 MED ORDER — ONDANSETRON 4 MG PO TBDP
4.0000 mg | ORAL_TABLET | Freq: Once | ORAL | Status: AC
  Administered 2023-02-22: 4 mg via ORAL
  Filled 2023-02-22: qty 1

## 2023-02-22 MED ORDER — LIDOCAINE HCL (PF) 1 % IJ SOLN
5.0000 mL | Freq: Once | INTRAMUSCULAR | Status: AC
  Administered 2023-02-22: 5 mL
  Filled 2023-02-22: qty 5

## 2023-02-22 MED ORDER — HYDROCODONE-ACETAMINOPHEN 5-325 MG PO TABS: 1.0000 | ORAL_TABLET | Freq: Three times a day (TID) | ORAL | 0 refills | Status: AC | PRN

## 2023-02-22 MED ORDER — ONDANSETRON 4 MG PO TBDP: 4.0000 mg | ORAL_TABLET | Freq: Three times a day (TID) | ORAL | 0 refills | Status: AC | PRN

## 2023-02-22 MED ORDER — HYDROCODONE-ACETAMINOPHEN 5-325 MG PO TABS
1.0000 | ORAL_TABLET | Freq: Once | ORAL | Status: AC
  Administered 2023-02-22: 1 via ORAL
  Filled 2023-02-22: qty 1

## 2023-02-22 NOTE — Discharge Instructions (Addendum)
You are being treated for a distal radial fracture. The fracture has been reduced in the emergency department. Keep the splint clean and dry. Apply ice packs over the splint to help reduce swelling. Take the pain medicine as needed. Follow-up with Carmon Ginsberg for further fracture management.

## 2023-02-22 NOTE — ED Notes (Signed)
Provider at bedside for reduction

## 2023-02-22 NOTE — ED Provider Notes (Signed)
[facility] Emergency Department Provider Note     Event Date/Time   First MD Initiated Contact with Patient 02/22/23 1952     (approximate)   History   Arm Injury   HPI  Angela [name] is a 78 y.o. female with a noncontributory medical history, presents to the ED for evaluation of mechanical injury to the left arm.  She tripped over some cords at home, reportedly landing on her left wrist.  She presents with a gross deformity to the left wrist as well as some paresthesias.  She denies any head injury or LOC.  She does report some mild right upper arm pain and some resolved left hip pain.  Patient ambulates to the ED without difficulty.  Physical Exam   Triage Vital Signs: ED Triage Vitals  Encounter Vitals Group     BP 02/22/23 1829 (!) 153/88     Systolic BP Percentile --      Diastolic BP Percentile --      Pulse Rate 02/22/23 1829 73     Resp 02/22/23 1829 18     Temp 02/22/23 1829 98 F (36.7 C)     Temp Source 02/22/23 1829 Oral     SpO2 02/22/23 1829 100 %     Weight 02/22/23 1831 130 lb (59 kg)     Height 02/22/23 1831 5\' 6"  (1.676 m)     Head Circumference --      Peak Flow --      Pain Score 02/22/23 1831 10     Pain Loc --      Pain Education --      Exclude from Growth Chart --     Most recent vital signs: Vitals:   02/22/23 1829 02/22/23 2204  BP: (!) 153/88 108/69  Pulse: 73 74  Resp: 18 18  Temp: 98 F (36.7 C)   SpO2: 100% 98%    General Awake, no distress. NAD HEENT NCAT. PERRL. EOMI. No rhinorrhea. Mucous membranes are moist.  CV:  Good peripheral perfusion. RRR RESP:  Normal effort. CTA ABD:  No distention.  MSK:  Left wrist with gross dorsal deformity noted.  Soft tissue swelling and early ecchymosis appreciated.  Decreased supination range of motion but normal composite fist is noted.  RUE without obvious deformity or dislocation.  No joint effusions appreciated.  Normal range of motion and no evidence of  internal derangement.   ED Results / Procedures / Treatments   Labs (all labs ordered are listed, but only abnormal results are displayed) Labs Reviewed - No data to display   EKG   RADIOLOGY  I personally viewed and evaluated these images as part of my medical decision making, as well as reviewing the written report by the radiologist.  ED Provider Interpretation: Acute comminuted intra-articular distal radius fracture, mildly displaced ulnar styloid fracture mildly displaced  DG Wrist 2 Views Left Result Date: 02/22/2023 CLINICAL DATA:  Postreduction EXAM: LEFT WRIST - 2 VIEW COMPARISON:  02/22/2023 FINDINGS: In cast views of the left wrist demonstrate distal left radial and ulnar styloid fractures. Alignment of the distal radial fracture improved with mild residual displacement. No subluxation or dislocation. IMPRESSION: Improving alignment across the distal radial fracture. Electronically Signed   By: Charlett Nose M.D.   On: 02/22/2023 22:21   DG Humerus Right Result Date: 02/22/2023 CLINICAL DATA:  Status post fall with proximal right humeral pain EXAM: RIGHT HUMERUS - 2 VIEW COMPARISON:  Right shoulder radiograph dated 05/07/2022  FINDINGS: There is no evidence of fracture or other focal bone lesions. Degenerative changes of the right shoulder. Soft tissues are unremarkable. IMPRESSION: 1. No acute fracture or dislocation. 2. Degenerative changes of the right shoulder. Electronically Signed   By: Agustin Cree M.D.   On: 02/22/2023 21:03   DG Wrist Complete Left Result Date: 02/22/2023 CLINICAL DATA:  Pain and deformity after fall EXAM: LEFT WRIST - COMPLETE 3+ VIEW COMPARISON:  None Available. FINDINGS: Acute comminuted fracture of the distal left radius. Fracture lines extend into the radiocarpal joint. Slight apex volar angulation. Additional displaced fracture of the ulnar styloid. Soft tissue swelling about the wrist. IMPRESSION: Acute comminuted intra-articular fracture of the distal  left radius. Mildly displaced ulnar styloid fracture. Electronically Signed   By: Minerva Fester M.D.   On: 02/22/2023 19:19     PROCEDURES:  Critical Care performed: No  .Reduction of fracture  Date/Time: 02/22/2023 10:04 PM  Performed by: Lissa Hoard, PA-C Authorized by: Lissa Hoard, PA-C  Consent: Verbal consent obtained. Risks and benefits: risks, benefits and alternatives were discussed Consent given by: patient Patient understanding: patient states understanding of the procedure being performed Patient consent: the patient's understanding of the procedure matches consent given Test results: test results available and properly labeled Site marked: the operative site was marked Imaging studies: imaging studies available Patient identity confirmed: verbally with patient Time out: Immediately prior to procedure a "time out" was called to verify the correct patient, procedure, equipment, support staff and site/side marked as required. Preparation: Patient was prepped and draped in the usual sterile fashion. Local anesthesia used: yes Anesthesia: hematoma block  Anesthesia: Local anesthesia used: yes Local Anesthetic: lidocaine 1% without epinephrine Anesthetic total: 3 mL  Sedation: Patient sedated: no  Patient tolerance: patient tolerated the procedure well with no immediate complications Comments: Closed reduction using manual traction and finger traps with counterweight.  Significant improvement of angulation dislocation and postreduction films.  OCL sugar-tong splint placed after reduction.  Postreduction films reviewed.   Marland KitchenSplint Application  Date/Time: 02/22/2023 9:27 PM  Performed by: Lissa Hoard, PA-C Authorized by: Lissa Hoard, PA-C   Consent:    Consent obtained:  Verbal   Consent given by:  Patient   Risks, benefits, and alternatives were discussed: yes     Risks discussed:  Discoloration Universal protocol:     Imaging studies available: yes     Site/side marked: yes     Patient identity confirmed:  Verbally with patient Pre-procedure details:    Distal neurologic exam:  Normal   Distal perfusion: distal pulses strong   Procedure details:    Location:  Wrist   Wrist location:  L wrist   Splint type:  Sugar tong   Supplies:  Cotton padding, elastic bandage, sling and fiberglass   Attestation: Splint applied and adjusted personally by me   Post-procedure details:    Distal neurologic exam:  Normal   Distal perfusion: distal pulses strong     Procedure completion:  Tolerated well, no immediate complications   Post-procedure imaging: reviewed      MEDICATIONS ORDERED IN ED: Medications  ondansetron (ZOFRAN-ODT) disintegrating tablet 4 mg (4 mg Oral Given 02/22/23 2016)  HYDROcodone-acetaminophen (NORCO/VICODIN) 5-325 MG per tablet 1 tablet (1 tablet Oral Given 02/22/23 2015)  lidocaine (PF) (XYLOCAINE) 1 % injection 5 mL (5 mLs Infiltration Given by Other 02/22/23 2123)     IMPRESSION / MDM / ASSESSMENT AND PLAN / ED COURSE  I reviewed the triage vital signs and the nursing notes.                              Differential diagnosis includes, but is not limited to, wrist fracture, wrist sprain, wrist dislocation  Patient's presentation is most consistent with acute complicated illness / injury requiring diagnostic workup.  ----------------------------------------- 8:13 PM on 02/22/2023 ----------------------------------------- S/W Dr. Audelia Acton via secure chat.  He reviewed the images and recommended closed reduction and outpatient management.  Patient will follow-up with Dr. Rosita Kea at [facility] as discussed.  Patient's diagnosis is consistent with left wrist fracture following mechanical fall.  Patient presents to the ED for evaluation of her acute wrist pain and disability along with obvious deformity.  X-ray reviewed by me, revealed a comminuted impacted, distal radial fracture with the  ulnar styloid fracture.  Remaining films with no acute findings.  Patient consents to reduction which was performed after hematoma block.  OCL splint is placed with postreduction films revealing improved fracture alignment.  Patient will be discharged home with prescriptions for hydrocodone and Zofran. Patient is to follow up with Dr. Rosita Kea as discussed, as needed or otherwise directed. Patient is given ED precautions to return to the ED for any worsening or new symptoms.   FINAL CLINICAL IMPRESSION(S) / ED DIAGNOSES   Final diagnoses:  Fall, initial encounter  Other closed intra-articular fracture of distal end of left radius, initial encounter     Rx / DC Orders   ED Discharge Orders          Ordered    ondansetron (ZOFRAN-ODT) 4 MG disintegrating tablet  Every 8 hours PRN        02/22/23 2138    HYDROcodone-acetaminophen (NORCO/VICODIN) 5-325 MG tablet  3 times daily PRN        02/22/23 2138             Note:  This document was prepared using Dragon voice recognition software and may include unintentional dictation errors.    Lissa Hoard, PA-C 02/22/23 2331    Dionne Bucy, MD 02/22/23 [phone]

## 2023-02-22 NOTE — ED Provider Triage Note (Signed)
Emergency Medicine Provider Triage Evaluation Note  Angela [name] , a 78 y.o. female  was evaluated in triage.  Pt complains of tripping and falling over some computer cords. She did not hit her head. She caught herself with her left arm and believe she has broken her left wrist.   Review of Systems  Positive: Wrist pain Negative:   Physical Exam  There were no vitals taken for this visit. Gen:   Awake, no distress   Resp:  Normal effort  MSK:   Moves extremities without difficulty  Other:  Wrist is deformed, can move her fingers,   Medical Decision Making  Medically screening exam initiated at 6:27 PM.  Appropriate orders placed.  Angela [name] was informed that the remainder of the evaluation will be completed by another provider, this initial triage assessment does not replace that evaluation, and the importance of remaining in the ED until their evaluation is complete.    Cameron Ali, PA-C 02/22/23 1830

## 2023-02-22 NOTE — ED Triage Notes (Signed)
Patient to ED via POV for left arm injury. PT states she tripped over some cords that made her fall. Pt reports landing on left wrist- reports trying to "reset wrist" at home. C/o "tingling/numb" pain. States also having left hip pain but ambulatory without difficulty.

## 2023-03-09 ENCOUNTER — Other Ambulatory Visit: Payer: Self-pay | Admitting: Orthopedic Surgery

## 2023-03-09 ENCOUNTER — Ambulatory Visit: Payer: Medicare Other | Admitting: Orthopedic Surgery

## 2023-03-10 ENCOUNTER — Encounter: Payer: Self-pay | Admitting: Orthopedic Surgery

## 2023-03-14 ENCOUNTER — Other Ambulatory Visit: Payer: Self-pay

## 2023-03-14 ENCOUNTER — Encounter
Admission: RE | Admit: 2023-03-14 | Discharge: 2023-03-14 | Disposition: A | Discharge status: Home / Self Care | Payer: Medicare Other | Source: Ambulatory Visit | Attending: Surgery | Admitting: Surgery

## 2023-03-14 VITALS — Ht 66.0 in | Wt 129.0 lb

## 2023-03-14 DIAGNOSIS — Z01810 Encounter for preprocedural cardiovascular examination: Secondary | ICD-10-CM

## 2023-03-14 DIAGNOSIS — I10 Essential (primary) hypertension: Secondary | ICD-10-CM

## 2023-03-14 NOTE — Patient Instructions (Addendum)
 Your procedure is scheduled on:  Wednesday March 5  Report to the Registration Desk on the 1st floor of the [facility] To find out your arrival time, please call [phone] between 1PM - 3PM on: Tuesday March 4  If your arrival time is 6:00 am, do not arrive before that time as the [facility] entrance doors do not open until 6:00 am.  REMEMBER: Instructions that are not followed completely may result in serious medical risk, up to and including death; or upon the discretion of your surgeon and anesthesiologist your surgery may need to be rescheduled.  Do not eat food after midnight the night before surgery.  No gum chewing or hard candies.  You may however, drink CLEAR liquids up to 2 hours before you are scheduled to arrive for your surgery. Do not drink anything within 2 hours of your scheduled arrival time.  Clear liquids include: - water  - apple juice without pulp - gatorade (not RED colors) - black coffee or tea (Do NOT add milk or creamers to the coffee or tea) Do NOT drink anything that is not on this list.  In addition, your doctor has ordered for you to drink the provided:  Ensure Pre-Surgery Clear Carbohydrate Drink   Drinking this carbohydrate drink up to two hours before surgery helps to reduce insulin resistance and improve patient outcomes. Please complete drinking 2 hours before scheduled arrival time.  One week prior to surgery: Stop Anti-inflammatories (NSAIDS) such as Advil, Aleve, Ibuprofen, Motrin, Naproxen, Naprosyn and Aspirin based products such as Excedrin, Goody's Powder, BC Powder. Stop ANY OVER THE COUNTER supplements until after surgery.  You may however, continue to take Tylenol if needed for pain up until the day of surgery.  Continue taking all of your other prescription medications up until the day of surgery.  ON THE DAY OF SURGERY ONLY TAKE THESE MEDICATIONS WITH SIPS OF WATER:  levothyroxine (SYNTHROID   No Alcohol for 24 hours before  or after surgery.  No Smoking including e-cigarettes for 24 hours before surgery.  No chewable tobacco products for at least 6 hours before surgery.  No nicotine patches on the day of surgery.  Do not use any "recreational" drugs for at least a week (preferably 2 weeks) before your surgery.  Please be advised that the combination of cocaine and anesthesia may have negative outcomes, up to and including death. If you test positive for cocaine, your surgery will be cancelled.  On the morning of surgery brush your teeth with toothpaste and water, you may rinse your mouth with mouthwash if you wish. Do not swallow any toothpaste or mouthwash.  Use CHG Soap as directed on instruction sheet.  Do not wear jewelry, make-up, hairpins, clips or nail polish.  For welded (permanent) jewelry: bracelets, anklets, waist bands, etc.  Please have this removed prior to surgery.  If it is not removed, there is a chance that hospital personnel will need to cut it off on the day of surgery.  Do not wear lotions, powders, or perfumes.   Do not shave body hair from the neck down 48 hours before surgery.  Contact lenses, hearing aids and dentures may not be worn into surgery.  Do not bring valuables to the hospital. [facility] is not responsible for any missing/lost belongings or valuables.   Notify your doctor if there is any change in your medical condition (cold, fever, infection).  Wear comfortable clothing (specific to your surgery type) to the hospital.  After surgery, you can help prevent lung complications by doing breathing exercises.  Take deep breaths and cough every 1-2 hours. Your doctor may order a device called an Incentive Spirometer to help you take deep breaths.  If you are being discharged the day of surgery, you will not be allowed to drive home. You will need a responsible individual to drive you home and stay with you for 24 hours after surgery.   If you are taking public  transportation, you will need to have a responsible individual with you.  Please call the Pre-admissions Testing Dept. at [phone] if you have any questions about these instructions.  Surgery Visitation Policy:  Patients having surgery or a procedure may have two visitors.  Children under the age of 79 must have an adult with them who is not the patient.  Temporary Visitor Restrictions Due to increasing cases of flu, RSV and COVID-19: Children ages 23 and under will not be able to visit patients in [facility] hospitals under most circumstances.         Preparing for Surgery with CHLORHEXIDINE GLUCONATE (CHG) Soap  Chlorhexidine Gluconate (CHG) Soap  o An antiseptic cleaner that kills germs and bonds with the skin to continue killing germs even after washing  o Used for showering the night before surgery and morning of surgery  Before surgery, you can play an important role by reducing the number of germs on your skin.  CHG (Chlorhexidine gluconate) soap is an antiseptic cleanser which kills germs and bonds with the skin to continue killing germs even after washing.  Please do not use if you have an allergy to CHG or antibacterial soaps. If your skin becomes reddened/irritated stop using the CHG.  1. Shower the NIGHT BEFORE SURGERY and the MORNING OF SURGERY with CHG soap.  2. If you choose to wash your hair, wash your hair first as usual with your normal shampoo.  3. After shampooing, rinse your hair and body thoroughly to remove the shampoo.  4. Use CHG as you would any other liquid soap. You can apply CHG directly to the skin and wash gently with a scrungie or a clean washcloth.  5. Apply the CHG soap to your body only from the neck down. Do not use on open wounds or open sores. Avoid contact with your eyes, ears, mouth, and genitals (private parts). Wash face and genitals (private parts) with your normal soap.  6. Wash thoroughly, paying special attention to the  area where your surgery will be performed.  7. Thoroughly rinse your body with warm water.  8. Do not shower/wash with your normal soap after using and rinsing off the CHG soap.  9. Pat yourself dry with a clean towel.  10. Wear clean pajamas to bed the night before surgery.  12. Place clean sheets on your bed the night of your first shower and do not sleep with pets.  13. Shower again with the CHG soap on the day of surgery prior to arriving at the hospital.  14. Do not apply any deodorants/lotions/powders.  15. Please wear clean clothes to the hospital.   How to Use an Incentive Spirometer  An incentive spirometer is a tool that measures how well you are filling your lungs with each breath. Learning to take long, deep breaths using this tool can help you keep your lungs clear and active. This may help to reverse or lessen your chance of developing breathing (pulmonary) problems, especially infection. You may be asked  to use a spirometer: After a surgery. If you have a lung problem or a history of smoking. After a long period of time when you have been unable to move or be active. If the spirometer includes an indicator to show the highest number that you have reached, your health care provider or respiratory therapist will help you set a goal. Keep a log of your progress as told by your health care provider. What are the risks? Breathing too quickly may cause dizziness or cause you to pass out. Take your time so you do not get dizzy or light-headed. If you are in pain, you may need to take pain medicine before doing incentive spirometry. It is harder to take a deep breath if you are having pain. How to use your incentive spirometer  Sit up on the edge of your bed or on a chair. Hold the incentive spirometer so that it is in an upright position. Before you use the spirometer, breathe out normally. Place the mouthpiece in your mouth. Make sure your lips are closed tightly around  it. Breathe in slowly and as deeply as you can through your mouth, causing the piston or the ball to rise toward the top of the chamber. Hold your breath for 3-5 seconds, or for as long as possible. If the spirometer includes a coach indicator, use this to guide you in breathing. Slow down your breathing if the indicator goes above the marked areas. Remove the mouthpiece from your mouth and breathe out normally. The piston or ball will return to the bottom of the chamber. Rest for a few seconds, then repeat the steps 10 or more times. Take your time and take a few normal breaths between deep breaths so that you do not get dizzy or light-headed. Do this every 1-2 hours when you are awake. If the spirometer includes a goal marker to show the highest number you have reached (best effort), use this as a goal to work toward during each repetition. After each set of 10 deep breaths, cough a few times. This will help to make sure that your lungs are clear. If you have an incision on your chest or abdomen from surgery, place a pillow or a rolled-up towel firmly against the incision when you cough. This can help to reduce pain while taking deep breaths and coughing. General tips When you are able to get out of bed: Walk around often. Continue to take deep breaths and cough in order to clear your lungs. Keep using the incentive spirometer until your health care provider says it is okay to stop using it. If you have been in the hospital, you may be told to keep using the spirometer at home. Contact a health care provider if: You are having difficulty using the spirometer. You have trouble using the spirometer as often as instructed. Your pain medicine is not giving enough relief for you to use the spirometer as told. You have a fever. Get help right away if: You develop shortness of breath. You develop a cough with bloody mucus from the lungs. You have fluid or blood coming from an incision site after  you cough. Summary An incentive spirometer is a tool that can help you learn to take long, deep breaths to keep your lungs clear and active. You may be asked to use a spirometer after a surgery, if you have a lung problem or a history of smoking, or if you have been inactive for a long period of  time. Use your incentive spirometer as instructed every 1-2 hours while you are awake. If you have an incision on your chest or abdomen, place a pillow or a rolled-up towel firmly against your incision when you cough. This will help to reduce pain. Get help right away if you have shortness of breath, you cough up bloody mucus, or blood comes from your incision when you cough. This information is not intended to replace advice given to you by your health care provider. Make sure you discuss any questions you have with your health care provider. Document Revised: 03/19/2019 Document Reviewed: 03/19/2019 Elsevier Patient Education  2023 [employer].

## 2023-03-15 ENCOUNTER — Ambulatory Visit: Payer: Medicare Other | Admitting: Dermatology

## 2023-03-15 ENCOUNTER — Encounter
Admission: RE | Admit: 2023-03-15 | Discharge: 2023-03-15 | Disposition: A | Discharge status: Home / Self Care | Source: Ambulatory Visit | Attending: Surgery | Admitting: Surgery

## 2023-03-15 NOTE — Progress Notes (Signed)
  Perioperative Services Pre-Admission/Anesthesia Testing    Date: 03/15/23  Name: Angela [name] MRN:   [id number]  Re: Plans for surgery  Planned Surgical Procedure(s):    Case: [id number] Date/Time: 03/16/23 1608   Procedure: OPEN REDUCTION INTERNAL FIXATION OF LEFT WRIST. (Left: Wrist)   Anesthesia type: Choice   Pre-op diagnosis: Other closed intra-articular fracture of distal end of left radius with routine healing, subsequent encounter S52.572D   Location: [facility] OR ROOM 02 / [facility]   Surgeons: Christena Flake, MD      Clinical Notes:  Patient is scheduled for the above procedure on 03/16/2023 with Dr. Leron Croak, MD.  Patient with a past medical history significant for multiple (19) surgeries on her jaw.  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  Patient has never had surgery here at [facility].  All of her procedures have been in [place].  She denies ever having to have an awake fiberoptic assisted intubation.  Orthopedic provider Rosita Kea, MD) with concerns for potential airway difficulties should endotracheal intubation be required for procedure.  Patient underwent her PAT interview on 03/14/2023.  PAT staff discussed concerns regarding potential need to transfer care to a facility with advanced airway capabilities, does not which include fiberoptics.  Patient was asked to come in on 03/14/2023, however due to transportation issues, patient could not present to the clinic for airway assessment.  I did discuss this with attending anesthesiologist Randa Ngo, MD).  Discussed potential for regional block with natural airway.  With that being said, anesthesiologist asking that official airway assessment be conducted by one of the on-duty anesthesiologists prior to patient coming in for surgery.  Today (03/15/2023 was the  first day that patient had transportation to present to our facility.    Patient was seen in consult on 03/15/2023 by on-call attending anesthesiologist Suzan Slick, MD).  Airway assessment was performed.  Return communication from the anesthesiologist following assessment.  MD noting that patient would be appropriate to have procedure performed in our facility tomorrow as planned. Appreciate anesthesia working patient into their day to ensure that she would be able to safely proceed with her orthopedic procedure tomorrow.  No other needs from the PAT department at this time.  Quentin Mulling, MSN, APRN, FNP-C, CEN [facility]  Perioperative Services Nurse Practitioner Phone: [phone] Fax: [phone] 03/15/23 11:10 AM  NOTE: This note has been prepared using Dragon dictation software. Despite my best ability to proofread, there is always the potential that unintentional transcriptional errors may still occur from this process.

## 2023-03-16 ENCOUNTER — Other Ambulatory Visit: Payer: Self-pay

## 2023-03-16 ENCOUNTER — Ambulatory Visit: Payer: Self-pay | Admitting: Anesthesiology

## 2023-03-16 ENCOUNTER — Encounter: Payer: Self-pay | Admitting: Surgery

## 2023-03-16 ENCOUNTER — Ambulatory Visit
Admission: RE | Admit: 2023-03-16 | Discharge: 2023-03-16 | Disposition: A | Discharge status: Home / Self Care | Payer: Medicare Other | Attending: Surgery | Admitting: Surgery

## 2023-03-16 ENCOUNTER — Ambulatory Visit

## 2023-03-16 ENCOUNTER — Encounter
Admission: RE | Disposition: A | Discharge status: Home / Self Care | Payer: Self-pay | Source: Home / Self Care | Attending: Surgery

## 2023-03-16 DIAGNOSIS — Z791 Long term (current) use of non-steroidal anti-inflammatories (NSAID): Secondary | ICD-10-CM | POA: Diagnosis not present

## 2023-03-16 DIAGNOSIS — G4733 Obstructive sleep apnea (adult) (pediatric): Secondary | ICD-10-CM | POA: Insufficient documentation

## 2023-03-16 DIAGNOSIS — R519 Headache, unspecified: Secondary | ICD-10-CM | POA: Diagnosis not present

## 2023-03-16 DIAGNOSIS — S52612A Displaced fracture of left ulna styloid process, initial encounter for closed fracture: Secondary | ICD-10-CM | POA: Insufficient documentation

## 2023-03-16 DIAGNOSIS — Z79890 Hormone replacement therapy (postmenopausal): Secondary | ICD-10-CM | POA: Insufficient documentation

## 2023-03-16 DIAGNOSIS — F900 Attention-deficit hyperactivity disorder, predominantly inattentive type: Secondary | ICD-10-CM | POA: Diagnosis not present

## 2023-03-16 DIAGNOSIS — I445 Left posterior fascicular block: Secondary | ICD-10-CM | POA: Insufficient documentation

## 2023-03-16 DIAGNOSIS — Z01810 Encounter for preprocedural cardiovascular examination: Secondary | ICD-10-CM

## 2023-03-16 DIAGNOSIS — Z87891 Personal history of nicotine dependence: Secondary | ICD-10-CM | POA: Diagnosis not present

## 2023-03-16 DIAGNOSIS — X58XXXA Exposure to other specified factors, initial encounter: Secondary | ICD-10-CM | POA: Insufficient documentation

## 2023-03-16 DIAGNOSIS — Z7952 Long term (current) use of systemic steroids: Secondary | ICD-10-CM | POA: Diagnosis not present

## 2023-03-16 DIAGNOSIS — I10 Essential (primary) hypertension: Secondary | ICD-10-CM | POA: Insufficient documentation

## 2023-03-16 DIAGNOSIS — S52552A Other extraarticular fracture of lower end of left radius, initial encounter for closed fracture: Secondary | ICD-10-CM | POA: Insufficient documentation

## 2023-03-16 DIAGNOSIS — Z79899 Other long term (current) drug therapy: Secondary | ICD-10-CM | POA: Insufficient documentation

## 2023-03-16 HISTORY — DX: Presence of spectacles and contact lenses: Z97.3

## 2023-03-16 HISTORY — PX: ORIF WRIST FRACTURE: SHX2133

## 2023-03-16 SURGERY — OPEN REDUCTION INTERNAL FIXATION (ORIF) WRIST FRACTURE
Anesthesia: General | Site: Wrist | Laterality: Left

## 2023-03-16 MED ORDER — CEFAZOLIN SODIUM-DEXTROSE 2-4 GM/100ML-% IV SOLN
2.0000 g | INTRAVENOUS | Status: AC
  Administered 2023-03-16: 2 g via INTRAVENOUS

## 2023-03-16 MED ORDER — PROPOFOL 500 MG/50ML IV EMUL
INTRAVENOUS | Status: DC | PRN
  Administered 2023-03-16: 100 mg via INTRAVENOUS
  Administered 2023-03-16: 50 ug/kg/min via INTRAVENOUS

## 2023-03-16 MED ORDER — ACETAMINOPHEN 10 MG/ML IV SOLN
INTRAVENOUS | Status: AC
  Filled 2023-03-16: qty 100

## 2023-03-16 MED ORDER — BUPIVACAINE LIPOSOME 1.3 % IJ SUSP
INTRAMUSCULAR | Status: DC | PRN
  Administered 2023-03-16: 5 mL

## 2023-03-16 MED ORDER — MIDAZOLAM HCL 2 MG/2ML IJ SOLN
INTRAMUSCULAR | Status: AC
  Filled 2023-03-16: qty 2

## 2023-03-16 MED ORDER — LACTATED RINGERS IV SOLN: INTRAVENOUS | Status: DC

## 2023-03-16 MED ORDER — PROPOFOL 10 MG/ML IV BOLUS
INTRAVENOUS | Status: AC
  Filled 2023-03-16: qty 40

## 2023-03-16 MED ORDER — DEXAMETHASONE SODIUM PHOSPHATE 10 MG/ML IJ SOLN
INTRAMUSCULAR | Status: DC | PRN
  Administered 2023-03-16: 5 mg via INTRAVENOUS

## 2023-03-16 MED ORDER — OXYCODONE HCL 5 MG/5ML PO SOLN: 5.0000 mg | Freq: Once | ORAL | Status: AC | PRN

## 2023-03-16 MED ORDER — LIDOCAINE HCL (PF) 2 % IJ SOLN
INTRAMUSCULAR | Status: AC
  Filled 2023-03-16: qty 5

## 2023-03-16 MED ORDER — ORAL CARE MOUTH RINSE: 15.0000 mL | Freq: Once | OROMUCOSAL | Status: AC

## 2023-03-16 MED ORDER — ONDANSETRON HCL 4 MG/2ML IJ SOLN
INTRAMUSCULAR | Status: AC
  Filled 2023-03-16: qty 2

## 2023-03-16 MED ORDER — CEFAZOLIN SODIUM-DEXTROSE 2-4 GM/100ML-% IV SOLN
INTRAVENOUS | Status: AC
  Filled 2023-03-16: qty 100

## 2023-03-16 MED ORDER — BUPIVACAINE HCL (PF) 0.5 % IJ SOLN
INTRAMUSCULAR | Status: DC | PRN
Start: 2023-03-16 — End: 2023-03-16
  Administered 2023-03-16: 10 mL

## 2023-03-16 MED ORDER — FENTANYL CITRATE (PF) 100 MCG/2ML IJ SOLN
INTRAMUSCULAR | Status: AC
  Filled 2023-03-16: qty 2

## 2023-03-16 MED ORDER — PHENYLEPHRINE 80 MCG/ML (10ML) SYRINGE FOR IV PUSH (FOR BLOOD PRESSURE SUPPORT)
PREFILLED_SYRINGE | INTRAVENOUS | Status: DC | PRN
Start: 2023-03-16 — End: 2023-03-16
  Administered 2023-03-16 (×2): 80 ug via INTRAVENOUS
  Administered 2023-03-16: 160 ug via INTRAVENOUS

## 2023-03-16 MED ORDER — MIDAZOLAM HCL 2 MG/2ML IJ SOLN
INTRAMUSCULAR | Status: DC | PRN
  Administered 2023-03-16: 2 mg via INTRAVENOUS

## 2023-03-16 MED ORDER — BUPIVACAINE HCL (PF) 0.5 % IJ SOLN
INTRAMUSCULAR | Status: AC
  Filled 2023-03-16: qty 30

## 2023-03-16 MED ORDER — OXYCODONE HCL 5 MG PO TABS
5.0000 mg | ORAL_TABLET | Freq: Once | ORAL | Status: AC | PRN
  Administered 2023-03-16: 5 mg via ORAL

## 2023-03-16 MED ORDER — HYDROCODONE-ACETAMINOPHEN 5-325 MG PO TABS: 1.0000 | ORAL_TABLET | Freq: Four times a day (QID) | ORAL | 0 refills | Status: AC | PRN

## 2023-03-16 MED ORDER — OXYCODONE HCL 5 MG PO TABS
ORAL_TABLET | ORAL | Status: AC
  Filled 2023-03-16: qty 1

## 2023-03-16 MED ORDER — ACETAMINOPHEN 10 MG/ML IV SOLN: 1000.0000 mg | Freq: Once | INTRAVENOUS | Status: DC | PRN

## 2023-03-16 MED ORDER — DROPERIDOL 2.5 MG/ML IJ SOLN: 0.6250 mg | Freq: Once | INTRAMUSCULAR | Status: DC | PRN

## 2023-03-16 MED ORDER — CHLORHEXIDINE GLUCONATE 0.12 % MT SOLN
15.0000 mL | Freq: Once | OROMUCOSAL | Status: AC
  Administered 2023-03-16: 15 mL via OROMUCOSAL

## 2023-03-16 MED ORDER — FENTANYL CITRATE PF 50 MCG/ML IJ SOSY
PREFILLED_SYRINGE | INTRAMUSCULAR | Status: AC
  Filled 2023-03-16: qty 1

## 2023-03-16 MED ORDER — FENTANYL CITRATE (PF) 100 MCG/2ML IJ SOLN
25.0000 ug | INTRAMUSCULAR | Status: DC | PRN
  Administered 2023-03-16: 25 ug via INTRAVENOUS

## 2023-03-16 MED ORDER — BUPIVACAINE HCL (PF) 0.5 % IJ SOLN
INTRAMUSCULAR | Status: DC | PRN
Start: 2023-03-16 — End: 2023-03-16
  Administered 2023-03-16: 20 mL via PERINEURAL

## 2023-03-16 MED ORDER — FENTANYL CITRATE (PF) 100 MCG/2ML IJ SOLN
INTRAMUSCULAR | Status: AC
Start: 2023-03-16 — End: ?
  Filled 2023-03-16: qty 2

## 2023-03-16 MED ORDER — KETAMINE HCL 50 MG/5ML IJ SOSY
PREFILLED_SYRINGE | INTRAMUSCULAR | Status: DC | PRN
  Administered 2023-03-16: 20 mg via INTRAVENOUS

## 2023-03-16 MED ORDER — DEXMEDETOMIDINE HCL IN NACL 80 MCG/20ML IV SOLN
INTRAVENOUS | Status: DC | PRN
  Administered 2023-03-16 (×2): 4 ug via INTRAVENOUS

## 2023-03-16 MED ORDER — CHLORHEXIDINE GLUCONATE 0.12 % MT SOLN
OROMUCOSAL | Status: AC
  Filled 2023-03-16: qty 15

## 2023-03-16 MED ORDER — AMPHETAMINE-DEXTROAMPHETAMINE 10 MG PO TABS: 10.0000 mg | ORAL_TABLET | Freq: Two times a day (BID) | ORAL | Status: AC

## 2023-03-16 MED ORDER — ACETAMINOPHEN 10 MG/ML IV SOLN
INTRAVENOUS | Status: DC | PRN
  Administered 2023-03-16: 1000 mg via INTRAVENOUS

## 2023-03-16 MED ORDER — ONDANSETRON HCL 4 MG/2ML IJ SOLN
INTRAMUSCULAR | Status: DC | PRN
  Administered 2023-03-16: 4 mg via INTRAVENOUS

## 2023-03-16 MED ORDER — 0.9 % SODIUM CHLORIDE (POUR BTL) OPTIME
TOPICAL | Status: DC | PRN
  Administered 2023-03-16: 500 mL

## 2023-03-16 MED ORDER — DEXAMETHASONE SODIUM PHOSPHATE 10 MG/ML IJ SOLN
INTRAMUSCULAR | Status: AC
  Filled 2023-03-16: qty 1

## 2023-03-16 MED ORDER — FENTANYL CITRATE (PF) 100 MCG/2ML IJ SOLN
INTRAMUSCULAR | Status: DC | PRN
  Administered 2023-03-16 (×2): 50 ug via INTRAVENOUS

## 2023-03-16 MED ORDER — KETAMINE HCL 50 MG/5ML IJ SOSY
PREFILLED_SYRINGE | INTRAMUSCULAR | Status: AC
  Filled 2023-03-16: qty 5

## 2023-03-16 SURGICAL SUPPLY — 51 items
BENZOIN TINCTURE PRP APPL 2/3 (GAUZE/BANDAGES/DRESSINGS) IMPLANT
BIT DRILL 2.2 SS TIBIAL (BIT) IMPLANT
BNDG COHESIVE 4X5 TAN STRL LF (GAUZE/BANDAGES/DRESSINGS) ×1 IMPLANT
BNDG ELASTIC 4INX 5YD STR LF (GAUZE/BANDAGES/DRESSINGS) ×1 IMPLANT
BNDG ESMARCH 4X12 STRL LF (GAUZE/BANDAGES/DRESSINGS) ×1 IMPLANT
CHLORAPREP W/TINT 26 (MISCELLANEOUS) ×2 IMPLANT
CORD BIP STRL DISP 12FT (MISCELLANEOUS) ×1 IMPLANT
CUFF TOURN SGL QUICK 18X4 (TOURNIQUET CUFF) IMPLANT
DRAPE FLUOR MINI C-ARM 54X84 (DRAPES) ×1 IMPLANT
DRAPE SURG 17X11 SM STRL (DRAPES) ×1 IMPLANT
DRAPE SURG ORHT 6 SPLT 77X108 (DRAPES) ×1 IMPLANT
DRAPE U-SHAPE 47X51 STRL (DRAPES) ×1 IMPLANT
ELECT CAUTERY BLADE 6.4 (BLADE) ×1 IMPLANT
ELECT REM PT RETURN 9FT ADLT (ELECTROSURGICAL) ×1 IMPLANT
ELECTRODE REM PT RTRN 9FT ADLT (ELECTROSURGICAL) ×1 IMPLANT
FORCEPS JEWEL BIP 4-3/4 STR (INSTRUMENTS) ×1 IMPLANT
GAUZE SPONGE 4X4 12PLY STRL (GAUZE/BANDAGES/DRESSINGS) ×1 IMPLANT
GAUZE XEROFORM 1X8 LF (GAUZE/BANDAGES/DRESSINGS) ×1 IMPLANT
GLOVE BIO SURGEON STRL SZ8 (GLOVE) ×1 IMPLANT
GLOVE INDICATOR 8.0 STRL GRN (GLOVE) ×1 IMPLANT
GOWN STRL REUS W/ TWL LRG LVL3 (GOWN DISPOSABLE) ×1 IMPLANT
GOWN STRL REUS W/ TWL XL LVL3 (GOWN DISPOSABLE) ×1 IMPLANT
K-WIRE FX5X1.6XNS BN SS (WIRE) ×2 IMPLANT
KIT TURNOVER KIT A (KITS) ×1 IMPLANT
KWIRE FX5X1.6XNS BN SS (WIRE) IMPLANT
MANIFOLD NEPTUNE II (INSTRUMENTS) ×1 IMPLANT
NDL FILTER BLUNT 18X1 1/2 (NEEDLE) ×1 IMPLANT
NEEDLE FILTER BLUNT 18X1 1/2 (NEEDLE) ×1 IMPLANT
NS IRRIG 500ML POUR BTL (IV SOLUTION) ×1 IMPLANT
PACK EXTREMITY ARMC (MISCELLANEOUS) ×1 IMPLANT
PADDING CAST BLEND 4X4 STRL (MISCELLANEOUS) ×2 IMPLANT
PEG LOCKING SMOOTH 2.2X15 (Peg) IMPLANT
PEG LOCKING SMOOTH 2.2X16 (Screw) IMPLANT
PEG LOCKING SMOOTH 2.2X18 (Peg) IMPLANT
PLATE NARROW DVR LEFT (Plate) IMPLANT
SCREW LOCK 14X2.7X 3 LD TPR (Screw) IMPLANT
SCREW LOCK 16X2.7X 3 LD TPR (Screw) IMPLANT
SCREW LP NL 2.7X15MM (Screw) IMPLANT
SCREW LP NL 2.7X16MM (Screw) IMPLANT
SCREW NONLOCK 2.7X18MM (Screw) IMPLANT
SPLINT CAST 1 STEP 3X12 (MISCELLANEOUS) IMPLANT
SPLINT CAST 1 STEP 4X15 (MISCELLANEOUS) IMPLANT
STAPLER SKIN PROX 35W (STAPLE) ×1 IMPLANT
STOCKINETTE IMPERVIOUS 9X36 MD (GAUZE/BANDAGES/DRESSINGS) ×1 IMPLANT
STRIP CLOSURE SKIN 1/4X4 (GAUZE/BANDAGES/DRESSINGS) IMPLANT
SUT PROLENE 4 0 PS 2 18 (SUTURE) ×1 IMPLANT
SUT VIC AB 2-0 SH 27XBRD (SUTURE) ×1 IMPLANT
SUT VIC AB 3-0 SH 27X BRD (SUTURE) ×1 IMPLANT
SYR 10ML LL (SYRINGE) ×1 IMPLANT
TRAP FLUID SMOKE EVACUATOR (MISCELLANEOUS) ×1 IMPLANT
WATER STERILE IRR 500ML POUR (IV SOLUTION) ×1 IMPLANT

## 2023-03-16 NOTE — Discharge Instructions (Addendum)
 Orthopedic discharge instructions: Keep splint dry and intact. Keep hand elevated above heart level. Apply ice to affected area frequently. Take pain medication as prescribed or ES Tylenol when needed.  Return for follow-up in 10-14 days or as scheduled.

## 2023-03-16 NOTE — H&P (Signed)
 History of Present Illness: Angela [name] is a 78 year old female who presents for evaluation and treatment of her left distal radius fracture to assess the need for surgery.  She sustained a left distal radius fracture on February 22, 2023. Initial management involved repositioning the bone, but recent imaging shows significant displacement and shortening of the distal radius. X-rays taken today reveal marked shortening and volar displacement of the distal fragment of the left wrist, with a longitudinal fracture down the shaft. The volar tilt is maintained but is much shorter than previously noted. The imaging impression indicates significant displacement with nearly a centimeter of shortening.  She experiences movement in the wrist and pain along the side of her hand, which is expected to persist for six months to a year. No numbness in the affected area.  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 She reports no heart or lung issues and is generally healthy.  Review of Systems: As noted above. The patient denies any chest pain, shortness of breath, nausea, vomiting, diarrhea, constipation, belly pain, blood in his/her stool, or burning with urination.  Past Medical History:  Attention deficit hyperactivity disorder, predominantly inattentive type  Chronic pain syndrome  Chronic, continuous use of opioids  Hypertension, benign  Irritable bowel syndrome  Osteoporosis  OSA on CPAP  Thyroid dysfunction  Trochanteric bursitis, left hip   Outpatient Medications: B complex vitamins capsule Take 1 capsule by mouth once daily  bisacodyL (DULCOLAX) 5 mg EC tablet Take 5 mg by mouth once daily as needed for Constipation  buprenorphine HCL (SUBUTEX) 8 mg SL tablet Place 4 mg under the tongue once daily  buprenorphine-naloxone (SUBOXONE) 2-0.5 mg SL tablet Place 1 tablet under the tongue 3  (three) times daily.  celecoxib (CELEBREX) 50 MG capsule Take 50 mg by mouth 2 (two) times daily  cholecalciferol (VITAMIN D3) 1000 unit capsule Take 1,000 Units by mouth once daily  cholecalciferol, vitamin D3, (VITAMIN D3) 125 mcg (5,000 unit) tablet Take 5,000 Units by mouth once daily  cyanocobalamin, vitamin B-12, (VITAMIN B-12) 5,000 mcg Subl Place 5,000 mcg under the tongue once daily  dextroamphetamine-amphetamine (ADDERALL) 10 mg tablet Take 10-20 mg by mouth every morning  DULoxetine (CYMBALTA) 30 MG DR capsule Take 30 mg by mouth once daily  FUROsemide (LASIX) 20 MG tablet Take 20 mg by mouth daily.  gabapentin (NEURONTIN) 100 MG capsule Take 100 mg by mouth 2 (two) times daily  HYDROcodone-acetaminophen (NORCO) 5-325 mg tablet Take 1 tablet by mouth every 4 (four) hours as needed for Pain 30 tablet 0  levothyroxine (SYNTHROID) 88 MCG tablet Take 88 mcg by mouth once daily  lisinopril-hydrochlorothiazide (PRINZIDE,ZESTORETIC) 10-12.5 mg tablet Take 1 tablet by mouth daily.  magnesium oxide (MAG-OX) 400 mg (241.3 mg magnesium) tablet Take 800 mg by mouth once daily  ondansetron (ZOFRAN-ODT) 4 MG disintegrating tablet Take 4 mg by mouth every 8 (eight) hours as needed  potassium chloride (MICRO-K) 10 MEQ ER capsule Take 10 mEq by mouth once daily  pregabalin (LYRICA) 75 MG capsule Take 75 mg by mouth 2 (two) times daily  zinc gluconate 50 mg tablet Take 50 mg by mouth once daily  estradioL (VAGIFEM) 10 mcg vaginal tablet Place 10 mcg vaginally twice a week (Patient not taking: Reported on 03/09/2023)  methylPREDNISolone (MEDROL) 4 MG tablet Take 4 mg by mouth once daily (Patient not taking: Reported on 03/09/2023)  promethazine (PHENERGAN) 12.5 MG  tablet Take 12.5 mg by mouth every 8 (eight) hours as needed. (Patient not taking: Reported on 03/09/2023)  tapentadol (NUCYNTA) 50 mg tablet Take 50 mg by mouth 2 (two) times daily. (Patient not taking: Reported on 03/09/2023)   valsartan-hydroCHLOROthiazide (DIOVAN-HCT) 160-25 mg tablet Take 1 tablet by mouth once daily (Patient not taking: Reported on 03/09/2023)  citalopram (CELEXA) 10 MG tablet Take 10 mg by mouth daily. (Patient not taking: Reported on 03/09/2023)   Physical Exam: Vitals:  03/09/23 1338  Height: 167.6 cm (5\' 6" )  PainSc: 7  PainLoc: Wrist  Body mass index is 21.37 kg/m.  HEENT: Very limited jaw motion. CHEST: Lungs clear to auscultation. CARDIOVASCULAR: Heart regular rhythm without murmur. Constitutional: alert, in NAD, and communicates well  Left wrist exam: The splint is intact and appears to be in good condition.  The skin is intact at the proximal and distal margins of the splint.  She is neurovascularly intact to the left hand.  Left wrist X-ray:  Marked shortening and volar displacement of the distal fragment, maintained volar tilt, longitudinal fracture down the shaft, significant displacement of left distal radius fracture with nearly a centimeter shortening (03/09/2023)  Assessment: 1.  Closed displaced extra-articular left distal radius fracture.  Plan: The treatment options, including both surgical and nonsurgical choices, have been discussed in detail with the patient.  She would like to proceed with surgical intervention to include an open reduction and internal fixation of the left distal radius fracture.  The risks (including bleeding, infection, nerve and/or blood vessel injury, persistent or recurrent pain, loosening or failure of the components, malunion and/or nonunion, need for further surgery, blood clots, strokes, heart attacks or arrhythmias, pneumonia, etc.) and benefits of the surgical procedure were discussed.  The patient states his/her understanding and agrees to proceed.  A formal written consent will be obtained by the nursing staff.    H&P reviewed and patient re-examined. No changes.

## 2023-03-16 NOTE — Transfer of Care (Signed)
 Immediate Anesthesia Transfer of Care Note  Patient: Angela [name]  Procedure(s) Performed: OPEN REDUCTION INTERNAL FIXATION OF LEFT WRIST. (Left: Wrist)  Patient Location: PACU  Anesthesia Type:General  Level of Consciousness: drowsy and patient cooperative  Airway & Oxygen Therapy: Patient Spontanous Breathing  Post-op Assessment: Report given to RN and Post -op Vital signs reviewed and stable  Post vital signs: Reviewed and stable  Last Vitals:  Vitals Value Taken Time  BP 137/90 03/16/23 1716  Temp    Pulse 87 03/16/23 1719  Resp 16 03/16/23 1719  SpO2 100 % 03/16/23 1719  Vitals shown include unfiled device data.  Last Pain:  Vitals:   03/16/23 1224  TempSrc: Temporal  PainSc: 4       Patients Stated Pain Goal: 0 (03/16/23 1224)  Complications: No notable events documented.

## 2023-03-16 NOTE — Op Note (Signed)
 03/16/2023  5:13 PM  Patient:   Angela [name]  Pre-Op Diagnosis:   Closed acute extra-articular distal radius fracture with ulnar styloid fracture, left wrist.  Post-Op Diagnosis:   Same.  Procedure:   Open reduction and internal fixation of left distal radius fracture.  Surgeon:   Maryagnes Amos, MD  Assistant:   None  Anesthesia:   General LMA with supraclavicular block placed preoperatively by the anesthesiologist  Findings:   As above.  Complications:   None  EBL:   0 cc  Fluids:   500 cc crystalloid  TT:   83 minutes at 250 mmHg  Drains:   None  Closure:   3-0 Vicryl subcuticular sutures  Implants:   Biomet DVR Cross-locked narrow precontoured distal radius mini-locking plate.  Brief Clinical Note:   The patient is a 78 year old female who sustained the above-noted injury 3 weeks ago following an an injury in which she tripped and fell over some cords at home and landed on her outstretched left hand. She presented to the emergency room where x-rays demonstrated the above-noted injury. She underwent a closed reduction by the ER provider and was splinted. She was advised to follow-up with orthopedics and presents at this time for definitive management of her injury.  Procedure:   The patient underwent placement of a supraclavicular block by the anesthesiologist in the preoperative holding area before she was brought into the operating room and lain in the supine position. After adequate general laryngeal mask anesthesia was obtained, the patient's left hand and upper extremity were prepped with ChloraPrep solution before being draped sterilely. Preoperative antibiotics were administered. A timeout was performed to verify the appropriate surgical site before the limb was exsanguinated with an Esmarch and the tourniquet inflated to 250 mmHg.   An approximately 7-8 cm incision was made over the volar aspect of the distal radius beginning at the volar flexion crease and  extending proximally along the flexor carpi radialis tendon. The incision was carried down through the subcutaneous tissues to expose the superficial retinaculum. This was split the length of the incision directly over the flexor carpi radialis tendon. The FCR sheath was opened and the tendon retracted ulnarly to protect the median nerve. The floor of the FCR sheath was opened to expose the pronator quadratus muscle. This was released along the radial insertion and the muscle was retracted ulnarly to expose the distal radius. The fracture was identified and soft tissues elevated off the distal metaphyseal region for several centimeters. Given the duration of time that a path since original injury, some healing tissue was present in the fracture site which was debrided. In addition, the brachial radialis tendon was released to allow the radial styloid to be mobilized and reduced.   The appropriate sized plate was selected and positioned on the distal radius. A guidewire was placed through the distal hole and its position verified using FluoroScan imaging in AP and lateral projections. After several attempts, the pin was positioned parallel to the distal articular surface and approximately 3-4 mm proximal to the articular surface. A nonlocking cortical screw was placed in the ulnar most hole of the more proximal row. The other holes were filled with locking pegs of the appropriate lengths. The plate was carefully lowered onto the volar metaphyseal surface, reducing the fracture in the process. Again the position of the plate was verified using FluoroScan imaging in AP and lateral projections and found to be excellent. The plate was secured using two nonlocking bicortical screw  and two locking cortical screws to secure the plate to the metaphyseal region proximally. Again the construct was assessed using FluoroScan imaging in AP, lateral, and oblique projections and found to be excellent.  The wound was copiously  irrigated with sterile saline solution before the pronator quadratus was reapproximated using 2-0 Vicryl interrupted sutures. The subcutaneous tissues were closed using 2-0 Vicryl interrupted sutures before the subcuticular layer was closed using 3-0 Vicryl inverted interrupted sutures. Benzoin and Steri-Strips are applied to the skin. A mixture of 15 cc of 0.5% plain Sensorcaine and 5 cc of Exparel was injected in and around the incision site to help with postoperative analgesia before a sterile bulky dressing was applied to the wound. The patient was placed into a volar splint maintaining the wrist in neutral position before the patient was awakened, extubated, and returned to the recovery room in satisfactory condition after tolerating the procedure well.

## 2023-03-16 NOTE — Anesthesia Procedure Notes (Signed)
 Procedure Name: LMA Insertion Date/Time: 03/16/2023 4:13 PM  Performed by: Ginger Carne, CRNAPre-anesthesia Checklist: Patient identified, Emergency Drugs available, Suction available, Patient being monitored and Timeout performed Patient Re-evaluated:Patient Re-evaluated prior to induction Oxygen Delivery Method: Circle system utilized Preoxygenation: Pre-oxygenation with 100% oxygen Induction Type: IV induction Ventilation: Mask ventilation without difficulty LMA: LMA inserted LMA Size: 3.0 Tube type: Oral Number of attempts: 1 Placement Confirmation: positive ETCO2 Tube secured with: Tape Dental Injury: Teeth and Oropharynx as per pre-operative assessment

## 2023-03-16 NOTE — Anesthesia Procedure Notes (Signed)
 Anesthesia Regional Block: Supraclavicular block   Pre-Anesthetic Checklist: , timeout performed,  Correct Patient, Correct Site, Correct Laterality,  Correct Procedure, Correct Position, site marked,  Risks and benefits discussed,  Surgical consent,  Pre-op evaluation,  At surgeon's request and post-op pain management  Laterality: Upper and Left  Prep: chloraprep       Needles:  Injection technique: Single-shot  Needle Type: Stimiplex     Needle Length: 9cm  Needle Gauge: 22     Additional Needles:   Procedures:,,,, ultrasound used (permanent image in chart),,    Narrative:  Start time: 03/16/2023 1:23 PM End time: 03/16/2023 1:26 PM Injection made incrementally with aspirations every 5 mL.  Performed by: Personally  Anesthesiologist: Foye Deer, MD  Additional Notes: Patient consented for risk and benefits of nerve block including but not limited to nerve damage, failed block, bleeding and infection.  Patient voiced understanding.  Functioning IV was confirmed and monitors were applied.  Timeout done prior to procedure and prior to any sedation being given to the patient.  Patient confirmed procedure site prior to any sedation given to the patient. Sterile prep,hand hygiene and sterile gloves were used.  Minimal sedation used for procedure.  No paresthesia endorsed by patient during the procedure.  Negative aspiration and negative test dose prior to incremental administration of local anesthetic. The patient tolerated the procedure well with no immediate complications.

## 2023-03-16 NOTE — Anesthesia Preprocedure Evaluation (Addendum)
 Anesthesia Evaluation  Patient identified by MRN, date of birth, ID band Patient awake    Reviewed: Allergy & Precautions, NPO status , Patient's Chart, lab work & pertinent test results  History of Anesthesia Complications (+) PONV, DIFFICULT AIRWAY and history of anesthetic complications  Airway Mallampati: III  TM Distance: >3 FB Neck ROM: Limited  Mouth opening: Limited Mouth Opening  Dental  (+) Teeth Intact, Dental Advisory Given   Pulmonary sleep apnea , former smoker   Pulmonary exam normal breath sounds clear to auscultation       Cardiovascular hypertension, Pt. 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)   Abdominal Normal abdominal exam  (+)   Peds  (+) ADHD Hematology negative hematology ROS (+)   Anesthesia Other Findings   Reproductive/Obstetrics                             Anesthesia Physical Anesthesia Plan  ASA: II  Anesthesia Plan: General   Post-op Pain Management: Regional block*   Induction: Intravenous  PONV Risk Score and Plan: 4 or greater and Dexamethasone, Ondansetron, Propofol infusion and TIVA  Airway Management Planned: Natural Airway  Additional Equipment:   Intra-op Plan:   Post-operative Plan:   Informed Consent: I have reviewed the patients History and Physical, chart, labs and discussed the procedure including the risks, benefits and alternatives for the proposed anesthesia with the patient or authorized representative who has indicated his/her understanding and acceptance.     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 Potential difficult intubation. Glidescope used in 2021 with no issues noted )        Anesthesia Quick Evaluation

## 2023-03-17 DIAGNOSIS — S52612A Displaced fracture of left ulna styloid process, initial encounter for closed fracture: Secondary | ICD-10-CM | POA: Insufficient documentation

## 2023-03-17 DIAGNOSIS — S52532A Colles' fracture of left radius, initial encounter for closed fracture: Secondary | ICD-10-CM | POA: Insufficient documentation

## 2023-03-17 NOTE — Anesthesia Postprocedure Evaluation (Signed)
 Anesthesia Post Note  Patient: Carletha Dawn  Procedure(s) Performed: OPEN REDUCTION INTERNAL FIXATION OF LEFT WRIST. (Left: Wrist)  Patient location during evaluation: PACU Anesthesia Type: General Level of consciousness: awake and alert Pain management: pain level controlled Vital Signs Assessment: post-procedure vital signs reviewed and stable Respiratory status: spontaneous breathing, nonlabored ventilation and respiratory function stable Cardiovascular status: blood pressure returned to baseline and stable Postop Assessment: no apparent nausea or vomiting Anesthetic complications: no   No notable events documented.   Last Vitals:  Vitals:   03/16/23 1750 03/16/23 1753  BP: (!) (P) 122/96 132/74  Pulse: (P) 71 71  Resp: (P) 16 13  Temp: (P) 36.7 C 36.7 C  SpO2: (P) 94% 94%    Last Pain:  Vitals:   03/16/23 1753  TempSrc:   PainSc: 4                  Foye Deer

## 2023-03-18 ENCOUNTER — Encounter: Payer: Self-pay | Admitting: Surgery

## 2023-03-21 ENCOUNTER — Encounter: Payer: Self-pay | Admitting: Surgery

## 2023-05-10 ENCOUNTER — Ambulatory Visit: Admitting: Dermatology

## 2023-05-17 ENCOUNTER — Ambulatory Visit: Admitting: Dermatology

## 2023-06-09 ENCOUNTER — Encounter: Payer: Self-pay | Admitting: Dermatology

## 2023-06-09 ENCOUNTER — Ambulatory Visit: Admitting: Dermatology

## 2023-06-09 DIAGNOSIS — E559 Vitamin D deficiency, unspecified: Secondary | ICD-10-CM | POA: Insufficient documentation

## 2023-06-09 DIAGNOSIS — W908XXA Exposure to other nonionizing radiation, initial encounter: Secondary | ICD-10-CM

## 2023-06-09 DIAGNOSIS — L821 Other seborrheic keratosis: Secondary | ICD-10-CM | POA: Diagnosis not present

## 2023-06-09 DIAGNOSIS — Z85828 Personal history of other malignant neoplasm of skin: Secondary | ICD-10-CM | POA: Diagnosis not present

## 2023-06-09 DIAGNOSIS — L570 Actinic keratosis: Secondary | ICD-10-CM

## 2023-06-09 DIAGNOSIS — I872 Venous insufficiency (chronic) (peripheral): Secondary | ICD-10-CM | POA: Insufficient documentation

## 2023-06-09 DIAGNOSIS — Z923 Personal history of irradiation: Secondary | ICD-10-CM | POA: Insufficient documentation

## 2023-06-09 DIAGNOSIS — E063 Autoimmune thyroiditis: Secondary | ICD-10-CM | POA: Insufficient documentation

## 2023-06-09 DIAGNOSIS — Z8639 Personal history of other endocrine, nutritional and metabolic disease: Secondary | ICD-10-CM | POA: Insufficient documentation

## 2023-06-09 DIAGNOSIS — Z860101 Personal history of adenomatous and serrated colon polyps: Secondary | ICD-10-CM | POA: Insufficient documentation

## 2023-06-09 NOTE — Progress Notes (Deleted)
   Follow-Up Visit   Subjective  Angela [name] is a 78 y.o. female who presents for the following: Skin Cancer Screening and Full Body Skin Exam. Hx of SCC.   The patient presents for Total-Body Skin Exam (TBSE) for skin cancer screening and mole check. The patient has spots, moles and lesions to be evaluated, some may be new or changing and the patient may have concern these could be cancer.    The following portions of the chart were reviewed this encounter and updated as appropriate: medications, allergies, medical history  Review of Systems:  No other skin or systemic complaints except as noted in HPI or Assessment and Plan.  Objective  Well appearing patient in no apparent distress; mood and affect are within normal limits.  A full examination was performed including scalp, head, eyes, ears, nose, lips, neck, chest, axillae, abdomen, back, buttocks, bilateral upper extremities, bilateral lower extremities, hands, feet, fingers, toes, fingernails, and toenails. All findings within normal limits unless otherwise noted below.   Relevant physical exam findings are noted in the Assessment and Plan.    Assessment & Plan   SKIN CANCER SCREENING PERFORMED TODAY.  ACTINIC DAMAGE - Chronic condition, secondary to cumulative UV/sun exposure - diffuse scaly erythematous macules with underlying dyspigmentation - Recommend daily broad spectrum sunscreen SPF 30+ to sun-exposed areas, reapply every 2 hours as needed.  - Staying in the shade or wearing long sleeves, sun glasses (UVA+UVB protection) and wide brim hats (4-inch brim around the entire circumference of the hat) are also recommended for sun protection.  - Call for new or changing lesions.  LENTIGINES, SEBORRHEIC KERATOSES, HEMANGIOMAS - Benign normal skin lesions - Benign-appearing - Call for any changes  MELANOCYTIC NEVI - Tan-brown and/or pink-flesh-colored symmetric macules and papules - Benign appearing on exam today -  Observation - Call clinic for new or changing moles - Recommend daily use of broad spectrum spf 30+ sunscreen to sun-exposed areas.        No follow-ups on file.  ***  Documentation: I have reviewed the above documentation for accuracy and completeness, and I agree with the above.  Harris Liming, MD

## 2023-06-09 NOTE — Patient Instructions (Addendum)

## 2023-06-09 NOTE — Progress Notes (Signed)
   Follow-Up Visit   Subjective  Angela [name] is a 78 y.o. female who presents for the following: "crunchy" spots at right arm and a spot at right cheek. Spot at right cheek present for about a year, tender to touch, no bleeding.  Patient would like to reschedule total body skin exam. Hx SCC.   The patient has spots, moles and lesions to be evaluated, some may be new or changing and the patient may have concern these could be cancer.   The following portions of the chart were reviewed this encounter and updated as appropriate: medications, allergies, medical history  Review of Systems:  No other skin or systemic complaints except as noted in HPI or Assessment and Plan.  Objective  Well appearing patient in no apparent distress; mood and affect are within normal limits.   A focused examination was performed of the following areas: Face, right arm  Relevant exam findings are noted in the Assessment and Plan.  R dorsal hand x 4, R arm x 11, R zygoma x 1 (16) Erythematous thin papules/macules with gritty scale.    Assessment & Plan   SEBORRHEIC KERATOSIS - Stuck-on, waxy, tan-brown papules and/or plaques  - Benign-appearing - Discussed benign etiology and prognosis. - Observe - Call for any changes    AK (ACTINIC KERATOSIS) (16) R dorsal hand x 4, R arm x 11, R zygoma x 1 (16) Actinic keratoses are precancerous spots that appear secondary to cumulative UV radiation exposure/sun exposure over time. They are chronic with expected duration over 1 year. A portion of actinic keratoses will progress to squamous cell carcinoma of the skin. It is not possible to reliably predict which spots will progress to skin cancer and so treatment is recommended to prevent development of skin cancer.  Recommend daily broad spectrum sunscreen SPF 30+ to sun-exposed areas, reapply every 2 hours as needed.  Recommend staying in the shade or wearing long sleeves, sun glasses (UVA+UVB protection) and  wide brim hats (4-inch brim around the entire circumference of the hat). Call for new or changing lesions.  Offered biopsy vs cryo and recheck. Jointly decided on cryo and recheck 3 months Destruction of lesion - R dorsal hand x 4, R arm x 11, R zygoma x 1 (16) Complexity: simple   Destruction method: cryotherapy   Informed consent: discussed and consent obtained   Timeout:  patient name, date of birth, surgical site, and procedure verified Lesion destroyed using liquid nitrogen: Yes   Region frozen until ice ball extended beyond lesion: Yes   Cryo cycles: 1 or 2. Outcome: patient tolerated procedure well with no complications   Post-procedure details: wound care instructions given   SEBORRHEIC KERATOSES    Return in about 3 months (around 09/09/2023) for TBSE, AK follow up, with Dr. Felipe Horton, [facility].  Kerstin Peeling, RMA, am acting as scribe for Harris Liming, MD .   Documentation: I have reviewed the above documentation for accuracy and completeness, and I agree with the above.  Harris Liming, MD

## 2023-09-08 ENCOUNTER — Other Ambulatory Visit: Payer: Self-pay | Admitting: Neurology

## 2023-09-09 ENCOUNTER — Other Ambulatory Visit: Payer: Self-pay | Admitting: Neurology

## 2023-09-15 ENCOUNTER — Ambulatory Visit: Admitting: Dermatology

## 2023-11-14 ENCOUNTER — Encounter: Payer: Self-pay | Admitting: Radiology

## 2023-11-19 NOTE — Progress Notes (Unsigned)
 Angela [name] - 78 y.o. [name] MRN [id number]  Date of birth: Mar 28, 1945  Office Visit Note: Visit Date: 11/21/2023 PCP: Gerome Brunet, DO Referred by: Gerome Brunet, DO  Subjective: No chief complaint on file.  HPI: Angela [name] is a pleasant 78 y.o. [name] who presents today for ongoing ulnar-sided wrist pain with associated numbness and tingling that has been progressive over the past multiple months.  She underwent open reduction internal fixation of the left wrist distal radius fracture approximately 6 months prior to an outside facility after mechanical fall.  She is having ongoing dysfunction and pain with the left wrist which is significantly limiting her abilities and activities of daily living.  Numbness and tingling is progressing, she is having ongoing nocturnal symptoms as well which are refractory to conservative care.  Pertinent ROS were reviewed with the patient and found to be negative unless otherwise specified above in HPI.   Visit Reason: left wrist ulnar-sided pain with associated numbness and tingling Duration of symptoms:6+ months Hand dominance: right Occupation: retired Diabetic: No Smoking: No Heart/Lung History: none Blood Thinners: none  Prior Testing/EMG: none Injections (Date): none Treatments: ORIF left distal radius Prior Surgery: March 16, 2023    Assessment & Plan: Visit Diagnoses:  1. Pain in left wrist     Plan: Extensive discussion was had with the patient and her daughter today regarding her left wrist pain and ongoing dysfunction.  I reviewed her wrist x-rays in detail today with her which do show prior wrist distal radius fracture hardware fixation with subsequent residual ulnar positivity and ongoing impaction which is consistent with her examination today.  We discussed treatment modalities ranging from conservative to surgical.  From a conservative standpoint, we discussed ongoing observation, bracing, activity modification and  nonsteroidal anti-inflammatory medication as needed moving forward.  I did explain that with the residual ulnar positivity in the current position, her ongoing ulnar-sided wrist pain will likely persist.  From a surgical standpoint we discussed ulnar shortening osteotomy in order to better match the ulnar length with the residual radial length, I also explained that we could perform hardware removal of the distal radius plate given the evidence of screw backout and symptomatic hardware.  As far as the numbness and tingling in the left hand, she has signs and symptoms compatible with carpal tunnel syndrome.  She meets all of the CTS 6 criteria to warrant carpal tunnel release in conjunction with her hardware removal surgery and ulnar shortening osteotomy.  After extensive discussion, patient would like to proceed with left wrist removal of hardware, ulnar shortening osteotomy and open carpal tunnel release at the next available date. Risks and benefits of the procedure were discussed, risks including but not limited to infection, bleeding, scarring, stiffness, nerve injury, tendon injury, vascular injury, hardware complication, recurrence of symptoms and need for subsequent operation.  We also discussed the appropriate postoperative protocol and timeframe for return to activities and function.  Patient expressed understanding.    Follow-up: No follow-ups on file.   Meds & Orders: No orders of the defined types were placed in this encounter.   Orders Placed This Encounter  Procedures   XR Wrist Complete Left     Procedures: No procedures performed      Clinical History: Narrative & Impression CLINICAL DATA:  Cervical radiculopathy.   EXAM: MRI CERVICAL SPINE WITHOUT CONTRAST   TECHNIQUE: Multiplanar, multisequence MR imaging of the cervical spine was performed. No intravenous contrast was administered.   COMPARISON:  11/16/2020  FINDINGS: Alignment: Physiologic.   Vertebrae: No  acute fracture, evidence of discitis, or aggressive bone lesion.   Cord: Normal signal and morphology.   Posterior Fossa, vertebral arteries, paraspinal tissues: Posterior fossa demonstrates no focal abnormality. Vertebral artery flow voids are maintained. Paraspinal soft tissues are unremarkable.   Disc levels:   Discs: Degenerative disease with disc height loss at C2-3, C3-4, C4-5, C5-6 and C6-7.   C2-3: Mild broad-based disc bulge. No foraminal or central canal stenosis.   C3-4: Mild broad-based disc bulge. Moderate right and mild left foraminal stenosis. No spinal stenosis.   C4-5: Broad-based disc bulge. Mild right and moderate left foraminal stenosis.   C5-6: Broad-based disc bulge. Moderate right and mild left foraminal stenosis. No spinal stenosis.   C6-7: Mild broad-based disc bulge. No foraminal or central canal stenosis.   C7-T1: No significant disc bulge. No neural foraminal stenosis. No central canal stenosis.   IMPRESSION: 1. Diffuse cervical spine spondylosis as described above, similar to the prior examination. 2. No acute osseous injury of the cervical spine.     Electronically Signed   By: Julaine Blanch M.D.   On: 07/06/2022 06:28  She reports that she quit smoking about 46 years ago. Her smoking use included cigarettes. She started smoking about 49 years ago. She has a 3 pack-year smoking history. She has never used smokeless tobacco. No results for input(s): HGBA1C, LABURIC in the last 8760 hours.  Objective:   Vital Signs: There were no vitals taken for this visit.  Physical Exam  Gen: Well-appearing, in no acute distress; non-toxic CV: Regular Rate. Well-perfused. Warm.  Resp: Breathing unlabored on room air; no wheezing. Psych: Fluid speech in conversation; appropriate affect; normal thought process  Ortho Exam PHYSICAL EXAM:  General: Patient is well appearing and in no distress.  Skin and Muscle: Prior well-healed surgical  incision over the volar aspect of the left wrist.  No other skin changes are apparent to hands.  Muscle bulk and contour normal, no signs of atrophy.     Range of Motion and Palpation Tests: Mobility is full about the elbows with flexion and extension.  Forearm supination and pronation are 85/85 right, 65/60 left.  Wrist flexion/extension is 75/65 right, 45/30 left.  Digital flexion and extension are full.  Thumb opposition is full to the base of the small fingers bilaterally.    No cords or nodules are palpated.  No triggering is observed.   Pain and crepitus elicited with extremes of motion with the left wrist  Neurologic, Vascular, Motor: Sensation is diminished to light touch in the left median nerve distributions.  Tinel's testing positive left carpal tunnel.  Two point discrimination is greater than 8 mm in the left median nerve distribution.    Phalen's positive left, Derkan's compression positive left  Fingers pink and well perfused.  Capillary refill is brisk.      No results found for: HGBA1C    Imaging: XR Wrist Complete Left Result Date: 11/21/2023 X-rays of the left wrist demonstrate prior hardware fixation of a distal radius fracture which demonstrates healing.  On the lateral view, there is concern for a distal screw within the plate backing out.  Notable residual ulnar positivity is appreciated with concern for ongoing impaction.   Past Medical/Family/Surgical/Social History: Medications & Allergies reviewed per EMR, new medications updated. 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   Vitamin D deficiency 06/09/2023   Closed displaced fracture of styloid process of left ulna 03/17/2023   Closed  Colles' fracture of left radius 03/17/2023   Primary osteoarthritis, right shoulder 11/16/2022   Right arm pain 09/08/2022   Urinary urgency 09/08/2022   Weakness 09/08/2022   Other spondylosis with radiculopathy, cervical region 11/03/2020   Hypothyroidism due to Hashimoto's thyroiditis 05/28/2020   Low serum cortisol level 05/28/2020   OSA on CPAP 05/26/2020   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   Attention deficit hyperactivity disorder, predominantly inattentive type 07/25/2019   Chronic pain syndrome 07/25/2019   Irritable bowel syndrome 07/25/2019   Urethral caruncle 07/25/2019   Thyroid  dysfunction 07/25/2019   Osteoporosis 07/09/2019   Hyperprolactinemia 07/09/2019   Trochanteric bursitis, left hip 06/28/2017   Chronic, continuous use of opioids 10/12/2016   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/12/2016   Other retention of urine 10/12/2016   Vaginal atrophy 10/12/2016   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   New onset headache 01/19/2016   Excessive sleepiness 01/19/2016   HYPERTENSION, BENIGN 03/10/2009   HYPERTENSION, UNCONTROLLED 01/29/2009   ARM PAIN, LEFT 01/29/2009   Past Medical History:  Diagnosis Date   Attention deficit hyperactivity disorder (ADHD), predominantly inattentive type 07/25/2019   Cancer (HCC)    skin   Chronic pain    Chronic, continuous use of opioids 10/12/2016   Complication of anesthesia    COVID 2023   Difficult intubation    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 to very limited mouth opening   Headache    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/12/2016   Hyperprolactinemia 07/09/2019   Hypertension    Hypothyroidism    Irritable bowel syndrome 07/25/2019   Joint disorder    Mitral valve prolapse    dx'd years ago - no issues   OSA on CPAP 05/26/2020   Osteoporosis 07/09/2019   Other retention of urine 10/12/2016   Other spondylosis with radiculopathy, cervical region 11/03/2020   Pneumonia     PONV (postoperative nausea and vomiting)    Porphyria (HCC)    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)   Primary osteoarthritis, right shoulder 11/16/2022   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   Sleep apnea    no CPAP   Squamous cell carcinoma of skin 09/23/2022   Left Middle Medial Pretibial, [facility]   Squamous cell carcinoma of skin 09/23/2022   Right Lateral Knee Superior, [facility]   Thyroid  dysfunction 07/25/2019   Trochanteric bursitis, left hip 06/28/2017   Urethral caruncle 07/25/2019   Urinary urgency 09/08/2022   Vaginal atrophy 10/12/2016   Wears contact lenses    Family History  Problem Relation Age of Onset   Breast cancer Mother    Rheum arthritis Mother    Aneurysm Father    Heart disease Father    Throat cancer Father    Melanoma Father    Heart disease Brother    Heart disease Brother    Thyroid  disease Brother    Post-traumatic stress disorder Brother    Rheum arthritis Brother    Polymyositis Brother    Obesity Brother    Past Surgical History:  Procedure Laterality Date   ABDOMINAL HYSTERECTOMY     Age 46   ANKLE SURGERY     x3   APPENDECTOMY     BIOPSY  10/19/2018   Procedure: BIOPSY;  Surgeon: Saintclair Jasper, MD;  Location: [facility];  Service: Gastroenterology;;   BIOPSY  04/11/2020   Procedure: BIOPSY;  Surgeon: Saintclair Jasper, MD;  Location: [facility];  Service: Gastroenterology;;   COLONOSCOPY WITH PROPOFOL  N/A 10/19/2018   Procedure: COLONOSCOPY WITH PROPOFOL ;  Surgeon: Saintclair Jasper, MD;  Location: [facility];  Service: Gastroenterology;  Laterality: N/A;   COLONOSCOPY WITH PROPOFOL  N/A 04/11/2020   Procedure: COLONOSCOPY WITH PROPOFOL ;  Surgeon: Saintclair Jasper, MD;  Location: [facility];  Service: Gastroenterology;  Laterality: N/A;  (difficulty airway)    DILATION AND CURETTAGE OF UTERUS     x 4   ESOPHAGOGASTRODUODENOSCOPY (EGD) WITH PROPOFOL  N/A 10/19/2018   Procedure: ESOPHAGOGASTRODUODENOSCOPY (EGD) WITH PROPOFOL ;  Surgeon: Saintclair Jasper, MD;  Location: [facility];  Service: Gastroenterology;  Laterality: N/A;   FRACTURE SURGERY     jaw   KNEE SURGERY     x 3   KYPHOPLASTY N/A 08/07/2019   Procedure: Thoracic twelve Vertebral Augmentation with Stenting/SpineJack;  Surgeon: Debby Dorn MATSU, MD;  Location: [facility];  Service: Neurosurgery;  Laterality: N/A;   MANDIBLE FRACTURE SURGERY     x 19   ORIF WRIST FRACTURE Left 03/16/2023   Procedure: OPEN REDUCTION INTERNAL FIXATION OF LEFT WRIST.;  Surgeon: Edie Norleen PARAS, MD;  Location: [facility];  Service: Orthopedics;  Laterality: Left;   POLYPECTOMY  10/19/2018   Procedure: POLYPECTOMY;  Surgeon: Saintclair Jasper, MD;  Location: [facility];  Service: Gastroenterology;;   POLYPECTOMY  04/11/2020   Procedure: POLYPECTOMY;  Surgeon: Saintclair Jasper, MD;  Location: [facility];  Service: Gastroenterology;;   Social History   Occupational History   Occupation: Part-time investment banker, corporate  Tobacco Use   Smoking status: Former    Current packs/day: 0.00    Average packs/day: 1 pack/day for 3.0 years (3.0 ttl pk-yrs)    Types: Cigarettes    Start date: 46    Quit date: 1979    Years since quitting: 46.8   Smokeless tobacco: Never   Tobacco comments:    Quit January 1980  Vaping Use   Vaping status: Never Used  Substance and Sexual Activity   Alcohol use: Yes    Alcohol/week: 4.0 standard drinks of alcohol    Types: 4 Glasses of wine per week   Drug use: No   Sexual activity: Not on file    Kennedi Lizardo Estela) [name], M.D. [facility], Hand Surgery

## 2023-11-19 NOTE — H&P (View-Only) (Signed)
 Angela [name] - 78 y.o. female MRN [id number]  Date of birth: 05-08-1945  Office Visit Note: Visit Date: 11/21/2023 PCP: Gerome Brunet, DO Referred by: Gerome Brunet, DO  Subjective: No chief complaint on file.  HPI: Angela [name] is a pleasant 78 y.o. female who presents today for ongoing ulnar-sided wrist pain with associated numbness and tingling that has been progressive over the past multiple months.  She underwent open reduction internal fixation of the left wrist distal radius fracture approximately 6 months prior to an outside facility after mechanical fall.  She is having ongoing dysfunction and pain with the left wrist which is significantly limiting her abilities and activities of daily living.  Numbness and tingling is progressing, she is having ongoing nocturnal symptoms as well which are refractory to conservative care.  Pertinent ROS were reviewed with the patient and found to be negative unless otherwise specified above in HPI.   Visit Reason: left wrist ulnar-sided pain with associated numbness and tingling Duration of symptoms:6+ months Hand dominance: right Occupation: retired Diabetic: No Smoking: No Heart/Lung History: none Blood Thinners: none  Prior Testing/EMG: none Injections (Date): none Treatments: ORIF left distal radius Prior Surgery: March 16, 2023    Assessment & Plan: Visit Diagnoses:  1. Pain in left wrist     Plan: Extensive discussion was had with the patient and her daughter today regarding her left wrist pain and ongoing dysfunction.  I reviewed her wrist x-rays in detail today with her which do show prior wrist distal radius fracture hardware fixation with subsequent residual ulnar positivity and ongoing impaction which is consistent with her examination today.  We discussed treatment modalities ranging from conservative to surgical.  From a conservative standpoint, we discussed ongoing observation, bracing, activity modification and  nonsteroidal anti-inflammatory medication as needed moving forward.  I did explain that with the residual ulnar positivity in the current position, her ongoing ulnar-sided wrist pain will likely persist.  From a surgical standpoint we discussed ulnar shortening osteotomy in order to better match the ulnar length with the residual radial length, I also explained that we could perform hardware removal of the distal radius plate given the evidence of screw backout and symptomatic hardware.  As far as the numbness and tingling in the left hand, she has signs and symptoms compatible with carpal tunnel syndrome.  She meets all of the CTS 6 criteria to warrant carpal tunnel release in conjunction with her hardware removal surgery and ulnar shortening osteotomy.  After extensive discussion, patient would like to proceed with left wrist removal of hardware, ulnar shortening osteotomy and open carpal tunnel release at the next available date. Risks and benefits of the procedure were discussed, risks including but not limited to infection, bleeding, scarring, stiffness, nerve injury, tendon injury, vascular injury, hardware complication, recurrence of symptoms and need for subsequent operation.  We also discussed the appropriate postoperative protocol and timeframe for return to activities and function.  Patient expressed understanding.    Follow-up: No follow-ups on file.   Meds & Orders: No orders of the defined types were placed in this encounter.   Orders Placed This Encounter  Procedures   XR Wrist Complete Left     Procedures: No procedures performed      Clinical History: Narrative & Impression CLINICAL DATA:  Cervical radiculopathy.   EXAM: MRI CERVICAL SPINE WITHOUT CONTRAST   TECHNIQUE: Multiplanar, multisequence MR imaging of the cervical spine was performed. No intravenous contrast was administered.   COMPARISON:  11/16/2020  FINDINGS: Alignment: Physiologic.   Vertebrae: No  acute fracture, evidence of discitis, or aggressive bone lesion.   Cord: Normal signal and morphology.   Posterior Fossa, vertebral arteries, paraspinal tissues: Posterior fossa demonstrates no focal abnormality. Vertebral artery flow voids are maintained. Paraspinal soft tissues are unremarkable.   Disc levels:   Discs: Degenerative disease with disc height loss at C2-3, C3-4, C4-5, C5-6 and C6-7.   C2-3: Mild broad-based disc bulge. No foraminal or central canal stenosis.   C3-4: Mild broad-based disc bulge. Moderate right and mild left foraminal stenosis. No spinal stenosis.   C4-5: Broad-based disc bulge. Mild right and moderate left foraminal stenosis.   C5-6: Broad-based disc bulge. Moderate right and mild left foraminal stenosis. No spinal stenosis.   C6-7: Mild broad-based disc bulge. No foraminal or central canal stenosis.   C7-T1: No significant disc bulge. No neural foraminal stenosis. No central canal stenosis.   IMPRESSION: 1. Diffuse cervical spine spondylosis as described above, similar to the prior examination. 2. No acute osseous injury of the cervical spine.     Electronically Signed   By: Julaine Blanch M.D.   On: 07/06/2022 06:28  She reports that she quit smoking about 46 years ago. Her smoking use included cigarettes. She started smoking about 49 years ago. She has a 3 pack-year smoking history. She has never used smokeless tobacco. No results for input(s): HGBA1C, LABURIC in the last 8760 hours.  Objective:   Vital Signs: There were no vitals taken for this visit.  Physical Exam  Gen: Well-appearing, in no acute distress; non-toxic CV: Regular Rate. Well-perfused. Warm.  Resp: Breathing unlabored on room air; no wheezing. Psych: Fluid speech in conversation; appropriate affect; normal thought process  Ortho Exam PHYSICAL EXAM:  General: Patient is well appearing and in no distress.  Skin and Muscle: Prior well-healed surgical  incision over the volar aspect of the left wrist.  No other skin changes are apparent to hands.  Muscle bulk and contour normal, no signs of atrophy.     Range of Motion and Palpation Tests: Mobility is full about the elbows with flexion and extension.  Forearm supination and pronation are 85/85 right, 65/60 left.  Wrist flexion/extension is 75/65 right, 45/30 left.  Digital flexion and extension are full.  Thumb opposition is full to the base of the small fingers bilaterally.    No cords or nodules are palpated.  No triggering is observed.   Pain and crepitus elicited with extremes of motion with the left wrist  Neurologic, Vascular, Motor: Sensation is diminished to light touch in the left median nerve distributions.  Tinel's testing positive left carpal tunnel.  Two point discrimination is greater than 8 mm in the left median nerve distribution.    Phalen's positive left, Derkan's compression positive left  Fingers pink and well perfused.  Capillary refill is brisk.      No results found for: HGBA1C    Imaging: XR Wrist Complete Left Result Date: 11/21/2023 X-rays of the left wrist demonstrate prior hardware fixation of a distal radius fracture which demonstrates healing.  On the lateral view, there is concern for a distal screw within the plate backing out.  Notable residual ulnar positivity is appreciated with concern for ongoing impaction.   Past Medical/Family/Surgical/Social History: Medications & Allergies reviewed per EMR, new medications updated. 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   Vitamin D deficiency 06/09/2023   Closed displaced fracture of styloid process of left ulna 03/17/2023   Closed  Colles' fracture of left radius 03/17/2023   Primary osteoarthritis, right shoulder 11/16/2022   Right arm pain 09/08/2022   Urinary urgency 09/08/2022   Weakness 09/08/2022   Other spondylosis with radiculopathy, cervical region 11/03/2020   Hypothyroidism due to Hashimoto's thyroiditis 05/28/2020   Low serum cortisol level 05/28/2020   OSA on CPAP 05/26/2020   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   Attention deficit hyperactivity disorder, predominantly inattentive type 07/25/2019   Chronic pain syndrome 07/25/2019   Irritable bowel syndrome 07/25/2019   Urethral caruncle 07/25/2019   Thyroid  dysfunction 07/25/2019   Osteoporosis 07/09/2019   Hyperprolactinemia 07/09/2019   Trochanteric bursitis, left hip 06/28/2017   Chronic, continuous use of opioids 10/12/2016   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/12/2016   Other retention of urine 10/12/2016   Vaginal atrophy 10/12/2016   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   New onset headache 01/19/2016   Excessive sleepiness 01/19/2016   HYPERTENSION, BENIGN 03/10/2009   HYPERTENSION, UNCONTROLLED 01/29/2009   ARM PAIN, LEFT 01/29/2009   Past Medical History:  Diagnosis Date   Attention deficit hyperactivity disorder (ADHD), predominantly inattentive type 07/25/2019   Cancer (HCC)    skin   Chronic pain    Chronic, continuous use of opioids 10/12/2016   Complication of anesthesia    COVID 2023   Difficult intubation    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 to very limited mouth opening   Headache    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/12/2016   Hyperprolactinemia 07/09/2019   Hypertension    Hypothyroidism    Irritable bowel syndrome 07/25/2019   Joint disorder    Mitral valve prolapse    dx'd years ago - no issues   OSA on CPAP 05/26/2020   Osteoporosis 07/09/2019   Other retention of urine 10/12/2016   Other spondylosis with radiculopathy, cervical region 11/03/2020   Pneumonia     PONV (postoperative nausea and vomiting)    Porphyria (HCC)    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)   Primary osteoarthritis, right shoulder 11/16/2022   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   Sleep apnea    no CPAP   Squamous cell carcinoma of skin 09/23/2022   Left Middle Medial Pretibial, [facility]   Squamous cell carcinoma of skin 09/23/2022   Right Lateral Knee Superior, [facility]   Thyroid  dysfunction 07/25/2019   Trochanteric bursitis, left hip 06/28/2017   Urethral caruncle 07/25/2019   Urinary urgency 09/08/2022   Vaginal atrophy 10/12/2016   Wears contact lenses    Family History  Problem Relation Age of Onset   Breast cancer Mother    Rheum arthritis Mother    Aneurysm Father    Heart disease Father    Throat cancer Father    Melanoma Father    Heart disease Brother    Heart disease Brother    Thyroid  disease Brother    Post-traumatic stress disorder Brother    Rheum arthritis Brother    Polymyositis Brother    Obesity Brother    Past Surgical History:  Procedure Laterality Date   ABDOMINAL HYSTERECTOMY     Age 89   ANKLE SURGERY     x3   APPENDECTOMY     BIOPSY  10/19/2018   Procedure: BIOPSY;  Surgeon: Saintclair Jasper, MD;  Location: [facility];  Service: Gastroenterology;;   BIOPSY  04/11/2020   Procedure: BIOPSY;  Surgeon: Saintclair Jasper, MD;  Location: [facility];  Service: Gastroenterology;;   COLONOSCOPY WITH PROPOFOL  N/A 10/19/2018   Procedure: COLONOSCOPY WITH PROPOFOL ;  Surgeon: Saintclair Jasper, MD;  Location: [facility];  Service: Gastroenterology;  Laterality: N/A;   COLONOSCOPY WITH PROPOFOL  N/A 04/11/2020   Procedure: COLONOSCOPY WITH PROPOFOL ;  Surgeon: Saintclair Jasper, MD;  Location: [facility];  Service: Gastroenterology;  Laterality: N/A;  (difficulty airway)    DILATION AND CURETTAGE OF UTERUS     x 4   ESOPHAGOGASTRODUODENOSCOPY (EGD) WITH PROPOFOL  N/A 10/19/2018   Procedure: ESOPHAGOGASTRODUODENOSCOPY (EGD) WITH PROPOFOL ;  Surgeon: Saintclair Jasper, MD;  Location: [facility];  Service: Gastroenterology;  Laterality: N/A;   FRACTURE SURGERY     jaw   KNEE SURGERY     x 3   KYPHOPLASTY N/A 08/07/2019   Procedure: Thoracic twelve Vertebral Augmentation with Stenting/SpineJack;  Surgeon: Debby Dorn MATSU, MD;  Location: [facility] OR;  Service: Neurosurgery;  Laterality: N/A;   MANDIBLE FRACTURE SURGERY     x 19   ORIF WRIST FRACTURE Left 03/16/2023   Procedure: OPEN REDUCTION INTERNAL FIXATION OF LEFT WRIST.;  Surgeon: Edie Norleen PARAS, MD;  Location: [facility];  Service: Orthopedics;  Laterality: Left;   POLYPECTOMY  10/19/2018   Procedure: POLYPECTOMY;  Surgeon: Saintclair Jasper, MD;  Location: [facility];  Service: Gastroenterology;;   POLYPECTOMY  04/11/2020   Procedure: POLYPECTOMY;  Surgeon: Saintclair Jasper, MD;  Location: [facility];  Service: Gastroenterology;;   Social History   Occupational History   Occupation: Part-time investment banker, corporate  Tobacco Use   Smoking status: Former    Current packs/day: 0.00    Average packs/day: 1 pack/day for 3.0 years (3.0 ttl pk-yrs)    Types: Cigarettes    Start date: 47    Quit date: 1979    Years since quitting: 46.8   Smokeless tobacco: Never   Tobacco comments:    Quit January 1980  Vaping Use   Vaping status: Never Used  Substance and Sexual Activity   Alcohol use: Yes    Alcohol/week: 4.0 standard drinks of alcohol    Types: 4 Glasses of wine per week   Drug use: No   Sexual activity: Not on file    Sheza Strickland Estela) Arlinda, M.D. [facility], Hand Surgery

## 2023-11-21 ENCOUNTER — Ambulatory Visit (INDEPENDENT_AMBULATORY_CARE_PROVIDER_SITE_OTHER): Admitting: Orthopedic Surgery

## 2023-11-21 ENCOUNTER — Other Ambulatory Visit: Payer: Self-pay

## 2023-11-21 DIAGNOSIS — M25532 Pain in left wrist: Secondary | ICD-10-CM | POA: Diagnosis not present

## 2023-11-28 ENCOUNTER — Ambulatory Visit: Admitting: Orthopedic Surgery

## 2023-12-05 ENCOUNTER — Other Ambulatory Visit: Payer: Self-pay

## 2023-12-05 DIAGNOSIS — M25532 Pain in left wrist: Secondary | ICD-10-CM

## 2023-12-07 ENCOUNTER — Encounter (HOSPITAL_BASED_OUTPATIENT_CLINIC_OR_DEPARTMENT_OTHER): Payer: Self-pay | Admitting: Orthopedic Surgery

## 2023-12-07 ENCOUNTER — Other Ambulatory Visit: Payer: Self-pay

## 2023-12-07 NOTE — Progress Notes (Signed)
   12/07/23 1111  PAT Phone Screen  Is the patient taking a GLP-1 receptor agonist? No  Do You Have Diabetes? No  Do You Have Hypertension? No  Have You Ever Been to the ER for Asthma? No  Have You Taken Oral Steroids in the Past 3 Months? No  Do you Take Phenteramine or any Other Diet Drugs? No  Recent  Lab Work, EKG, CXR? (S)  Yes  Where was this test performed? EKG 03/2023  Do you have a history of heart problems? No  Any Recent Hospitalizations? No  Height 5' 5 (1.651 m)  Weight 59 kg  Pat Appointment Scheduled (S)  Yes (BMP)

## 2023-12-13 ENCOUNTER — Encounter (HOSPITAL_BASED_OUTPATIENT_CLINIC_OR_DEPARTMENT_OTHER)
Admission: RE | Admit: 2023-12-13 | Discharge: 2023-12-13 | Disposition: A | Source: Ambulatory Visit | Attending: Orthopedic Surgery

## 2023-12-13 DIAGNOSIS — Z01818 Encounter for other preprocedural examination: Secondary | ICD-10-CM | POA: Insufficient documentation

## 2023-12-13 LAB — BASIC METABOLIC PANEL WITH GFR
Anion gap: 7 (ref 5–15)
BUN: 12 mg/dL (ref 8–23)
CO2: 31 mmol/L (ref 22–32)
Calcium: 9.1 mg/dL (ref 8.9–10.3)
Chloride: 98 mmol/L (ref 98–111)
Creatinine, Ser: 0.66 mg/dL (ref 0.44–1.00)
GFR, Estimated: >60 mL/min (ref >60)
Glucose, Bld: 96 mg/dL (ref 70–99)
Potassium: 3.1 mmol/L — ABNORMAL LOW (ref 3.5–5.1)
Sodium: 136 mmol/L (ref 135–145)

## 2023-12-13 NOTE — Progress Notes (Signed)

## 2023-12-16 ENCOUNTER — Encounter (HOSPITAL_BASED_OUTPATIENT_CLINIC_OR_DEPARTMENT_OTHER): Payer: Self-pay | Admitting: Orthopedic Surgery

## 2023-12-16 ENCOUNTER — Encounter (HOSPITAL_BASED_OUTPATIENT_CLINIC_OR_DEPARTMENT_OTHER): Admission: RE | Disposition: A | Payer: Self-pay | Source: Home / Self Care | Attending: Orthopedic Surgery

## 2023-12-16 ENCOUNTER — Ambulatory Visit (HOSPITAL_BASED_OUTPATIENT_CLINIC_OR_DEPARTMENT_OTHER): Payer: Self-pay | Admitting: Anesthesiology

## 2023-12-16 ENCOUNTER — Ambulatory Visit (HOSPITAL_BASED_OUTPATIENT_CLINIC_OR_DEPARTMENT_OTHER)
Admission: RE | Admit: 2023-12-16 | Discharge: 2023-12-16 | Disposition: A | Attending: Orthopedic Surgery | Admitting: Orthopedic Surgery

## 2023-12-16 ENCOUNTER — Ambulatory Visit (HOSPITAL_BASED_OUTPATIENT_CLINIC_OR_DEPARTMENT_OTHER)

## 2023-12-16 ENCOUNTER — Other Ambulatory Visit: Payer: Self-pay

## 2023-12-16 DIAGNOSIS — M25832 Other specified joint disorders, left wrist: Secondary | ICD-10-CM | POA: Diagnosis not present

## 2023-12-16 DIAGNOSIS — G5602 Carpal tunnel syndrome, left upper limb: Secondary | ICD-10-CM | POA: Diagnosis present

## 2023-12-16 DIAGNOSIS — Z969 Presence of functional implant, unspecified: Secondary | ICD-10-CM

## 2023-12-16 DIAGNOSIS — I10 Essential (primary) hypertension: Secondary | ICD-10-CM

## 2023-12-16 HISTORY — PX: CARPAL TUNNEL RELEASE: SHX101

## 2023-12-16 HISTORY — PX: HARDWARE REMOVAL: SHX979

## 2023-12-16 HISTORY — PX: ULNA OSTEOTOMY: SHX1077

## 2023-12-16 SURGERY — CARPAL TUNNEL RELEASE
Anesthesia: Regional | Site: Wrist | Laterality: Left

## 2023-12-16 MED ORDER — 0.9 % SODIUM CHLORIDE (POUR BTL) OPTIME
TOPICAL | Status: DC | PRN
  Administered 2023-12-16: 500 mL

## 2023-12-16 MED ORDER — HYDROMORPHONE HCL 1 MG/ML IJ SOLN: 0.2500 mg | INTRAMUSCULAR | Status: DC | PRN

## 2023-12-16 MED ORDER — CEFAZOLIN SODIUM-DEXTROSE 2-4 GM/100ML-% IV SOLN
2.0000 g | INTRAVENOUS | Status: AC
  Administered 2023-12-16: 2 g via INTRAVENOUS

## 2023-12-16 MED ORDER — OXYCODONE HCL 5 MG PO TABS: 5.0000 mg | ORAL_TABLET | Freq: Four times a day (QID) | ORAL | 0 refills | Status: DC | PRN

## 2023-12-16 MED ORDER — ROPIVACAINE HCL 5 MG/ML IJ SOLN
INTRAMUSCULAR | Status: DC | PRN
  Administered 2023-12-16: 30 mL via PERINEURAL

## 2023-12-16 MED ORDER — MIDAZOLAM HCL 2 MG/2ML IJ SOLN
INTRAMUSCULAR | Status: AC
  Filled 2023-12-16: qty 2

## 2023-12-16 MED ORDER — OXYCODONE HCL 5 MG/5ML PO SOLN: 5.0000 mg | Freq: Once | ORAL | Status: DC | PRN

## 2023-12-16 MED ORDER — PROPOFOL 10 MG/ML IV BOLUS
INTRAVENOUS | Status: DC | PRN
  Administered 2023-12-16: 50 mg via INTRAVENOUS
  Administered 2023-12-16: 75 ug/kg/min via INTRAVENOUS

## 2023-12-16 MED ORDER — PROPOFOL 10 MG/ML IV BOLUS
INTRAVENOUS | Status: AC
  Filled 2023-12-16: qty 20

## 2023-12-16 MED ORDER — LIDOCAINE-EPINEPHRINE (PF) 1 %-1:200000 IJ SOLN
INTRAMUSCULAR | Status: AC
  Filled 2023-12-16: qty 30

## 2023-12-16 MED ORDER — CEFAZOLIN SODIUM-DEXTROSE 2-4 GM/100ML-% IV SOLN
INTRAVENOUS | Status: AC
  Filled 2023-12-16: qty 100

## 2023-12-16 MED ORDER — OXYCODONE HCL 5 MG PO TABS: 5.0000 mg | ORAL_TABLET | Freq: Once | ORAL | Status: DC | PRN

## 2023-12-16 MED ORDER — FENTANYL CITRATE (PF) 100 MCG/2ML IJ SOLN
100.0000 ug | Freq: Once | INTRAMUSCULAR | Status: AC
  Administered 2023-12-16: 100 ug via INTRAVENOUS

## 2023-12-16 MED ORDER — LACTATED RINGERS IV SOLN: INTRAVENOUS | Status: DC

## 2023-12-16 MED ORDER — FENTANYL CITRATE (PF) 100 MCG/2ML IJ SOLN
INTRAMUSCULAR | Status: AC
  Filled 2023-12-16: qty 2

## 2023-12-16 MED ORDER — PROPOFOL 500 MG/50ML IV EMUL
INTRAVENOUS | Status: AC
  Filled 2023-12-16: qty 50

## 2023-12-16 MED ORDER — MIDAZOLAM HCL (PF) 2 MG/2ML IJ SOLN
2.0000 mg | Freq: Once | INTRAMUSCULAR | Status: AC
  Administered 2023-12-16: 1 mg via INTRAVENOUS

## 2023-12-16 SURGICAL SUPPLY — 65 items
BIT DRILL 2.3 120MM (DRILL) IMPLANT
BLADE AVERAGE 25X9 (BLADE) IMPLANT
BLADE MINI RND TIP GREEN BEAV (BLADE) ×2 IMPLANT
BLADE SAW 9 STYPE (MISCELLANEOUS) IMPLANT
BLADE SURG 15 STRL LF DISP TIS (BLADE) ×4 IMPLANT
BNDG COHESIVE 4X5 TAN STRL LF (GAUZE/BANDAGES/DRESSINGS) ×2 IMPLANT
BNDG COMPR ESMARK 4X3 LF (GAUZE/BANDAGES/DRESSINGS) ×2 IMPLANT
BNDG ELASTIC 3INX 5YD STR LF (GAUZE/BANDAGES/DRESSINGS) IMPLANT
BNDG ELASTIC 4INX 5YD STR LF (GAUZE/BANDAGES/DRESSINGS) ×4 IMPLANT
BNDG GAUZE DERMACEA FLUFF 4 (GAUZE/BANDAGES/DRESSINGS) ×2 IMPLANT
CHLORAPREP W/TINT 26 (MISCELLANEOUS) ×4 IMPLANT
CORD BIPOLAR FORCEPS 12FT (ELECTRODE) ×2 IMPLANT
COVER BACK TABLE 60X90IN (DRAPES) ×2 IMPLANT
CUFF TOURN SGL QUICK 18X4 (TOURNIQUET CUFF) ×2 IMPLANT
CUFF TRNQT CYL 24X4X16.5-23 (TOURNIQUET CUFF) ×2 IMPLANT
DRAPE HAND 77X146 (DRAPES) ×2 IMPLANT
DRAPE IMP U-DRAPE 54X76 (DRAPES) ×2 IMPLANT
DRAPE OEC MINIVIEW 54X84 (DRAPES) ×2 IMPLANT
DRAPE SURG 17X23 STRL (DRAPES) ×2 IMPLANT
DRAPE U-SHAPE 47X51 STRL (DRAPES) IMPLANT
GAUZE PAD ABD 8X10 STRL (GAUZE/BANDAGES/DRESSINGS) ×2 IMPLANT
GAUZE SPONGE 4X4 12PLY STRL (GAUZE/BANDAGES/DRESSINGS) ×2 IMPLANT
GAUZE STRETCH 2X75IN STRL (MISCELLANEOUS) ×2 IMPLANT
GAUZE XEROFORM 1X8 LF (GAUZE/BANDAGES/DRESSINGS) ×2 IMPLANT
GAUZE XEROFORM 5X9 LF (GAUZE/BANDAGES/DRESSINGS) IMPLANT
GLOVE BIO SURGEON STRL SZ7.5 (GLOVE) ×4 IMPLANT
GLOVE BIOGEL PI IND STRL 7.5 (GLOVE) ×4 IMPLANT
GOWN STRL REUS W/ TWL LRG LVL3 (GOWN DISPOSABLE) ×4 IMPLANT
GOWN STRL REUS W/TWL XL LVL3 (GOWN DISPOSABLE) IMPLANT
GOWN STRL SURGICAL XL XLNG (GOWN DISPOSABLE) ×4 IMPLANT
KWIRE 1.6X65 (WIRE) IMPLANT
KWIRE FX100X1.6XNS KRSH (WIRE) IMPLANT
MANIFOLD NEPTUNE II (INSTRUMENTS) ×2 IMPLANT
NDL HYPO 25X1 1.5 SAFETY (NEEDLE) IMPLANT
NDL HYPO 25X5/8 SAFETYGLIDE (NEEDLE) IMPLANT
PACK BASIN DAY SURGERY FS (CUSTOM PROCEDURE TRAY) ×2 IMPLANT
PAD CAST 4YDX4 CTTN HI CHSV (CAST SUPPLIES) ×4 IMPLANT
PLATE COMP ULNAR 7 HOLE (Plate) IMPLANT
SCREW CORT LAG 3.2X20 (Screw) IMPLANT
SCREW CORT THRD HEAD 3.2X10 (Screw) IMPLANT
SCREW HEX 3.2X12 (Screw) IMPLANT
SCREW LAG CORT 3.2X16 (Screw) IMPLANT
SCREW LOCKING 3.2X10MM (Screw) IMPLANT
SCREW LOCKING 3.2X12 (Screw) IMPLANT
SHEET MEDIUM DRAPE 40X70 STRL (DRAPES) ×2 IMPLANT
SLEEVE SCD COMPRESS KNEE MED (STOCKING) ×2 IMPLANT
SLING ARM FOAM STRAP LRG (SOFTGOODS) IMPLANT
SOLN 0.9% NACL POUR BTL 1000ML (IV SOLUTION) IMPLANT
SPIKE FLUID TRANSFER (MISCELLANEOUS) IMPLANT
SPLINT FIBERGLASS 4X30 (CAST SUPPLIES) IMPLANT
SPLINT PLASTER CAST XFAST 4X15 (CAST SUPPLIES) ×20 IMPLANT
SPONGE T-LAP 4X18 ~~LOC~~+RFID (SPONGE) ×2 IMPLANT
STOCKINETTE IMPERVIOUS 9X36 MD (GAUZE/BANDAGES/DRESSINGS) ×2 IMPLANT
STRAP SET WRIST TOWER ACUMED (MISCELLANEOUS) ×2 IMPLANT
SUCTION TUBE FRAZIER 10FR DISP (SUCTIONS) ×2 IMPLANT
SUT ETHILON 4 0 PS 2 18 (SUTURE) ×2 IMPLANT
SUT MNCRL AB 3-0 PS2 18 (SUTURE) IMPLANT
SUT MNCRL AB 3-0 PS2 27 (SUTURE) ×2 IMPLANT
SUT VIC AB 3-0 SH 27X BRD (SUTURE) IMPLANT
SUT VIC AB 4-0 PS2 18 (SUTURE) IMPLANT
SYR BULB EAR ULCER 3OZ GRN STR (SYRINGE) ×4 IMPLANT
SYR CONTROL 10ML LL (SYRINGE) ×2 IMPLANT
TOWEL GREEN STERILE FF (TOWEL DISPOSABLE) ×4 IMPLANT
TUBE CONNECTING 20X1/4 (TUBING) ×2 IMPLANT
UNDERPAD 30X36 HEAVY ABSORB (UNDERPADS AND DIAPERS) ×2 IMPLANT

## 2023-12-16 NOTE — Progress Notes (Signed)
 Assisted Dr. Hyacinth Meeker with left, supraclavicular, ultrasound guided block. Side rails up, monitors on throughout procedure. See vital signs in flow sheet. Tolerated Procedure well.

## 2023-12-16 NOTE — Op Note (Signed)
 NAME: Angela [name] MEDICAL RECORD NO: [id number] DATE OF BIRTH: 12/27/1945 FACILITY: [facility] LOCATION: [facility] PHYSICIAN: GILDARDO ALDERTON, MD   OPERATIVE REPORT   DATE OF PROCEDURE: 12/16/23    PREOPERATIVE DIAGNOSIS: Left wrist symptomatic hardware with associated carpal tunnel syndrome status post prior distal radius open reduction internal fixation, residual ulnar positivity and ulnar impaction syndrome   POSTOPERATIVE DIAGNOSIS: Same as preoperative diagnosis   PROCEDURE: Left wrist hardware removal, deep implant Left wrist open extended carpal tunnel release with hypothenar fat flap Left wrist ulnar shortening osteotomy   SURGEON:  Gildardo Alderton, M.D.   ASSISTANT: Joesph Dinsmore, OPA   ANESTHESIA:  Regional with sedation   INTRAVENOUS FLUIDS:  Per anesthesia flow sheet.   ESTIMATED BLOOD LOSS:  Minimal.   COMPLICATIONS:  None.   SPECIMENS:  none   TOURNIQUET TIME:    Total Tourniquet Time Documented: Upper Arm (Left) - 87 minutes Total: Upper Arm (Left) - 87 minutes    DISPOSITION:  Stable to PACU.   INDICATIONS: 78 year old female who had undergone previous left wrist distal radius fracture open reduction internal fixation facility approximately 1 year prior.  Patient subsequently healed with ongoing symptomatic hardware with evidence of screw failure and residual ulnar impaction.  Patient also developed subsequent carpal tunnel syndrome.  Given her ongoing symptoms that were refractory to conservative treatment, patient was indicated for left wrist symptomatic hardware removal, open extended carpal tunnel release with possible hypothenar fat flap and ulnar shortening osteotomy to correct the ulnar impaction syndrome.  Risks and benefits of surgery were discussed including the risks of infection, bleeding, scarring, stiffness, nerve injury, vascular injury, tendon injury, need for subsequent operation, persistent symptoms, hardware failure,  nonunion, malunion.  She voiced understanding of these risks and elected to proceed.  OPERATIVE COURSE: Patient was seen and identified in the preoperative area and marked appropriately.  Surgical consent had been signed. Preoperative IV antibiotic prophylaxis was given. She was transferred to the operating room and placed in supine position with the left upper extremity on an arm board.  Sedation was induced by the anesthesiologist. A regional block had been performed by anesthesia in preoperative holding.    Left upper extremity was prepped and draped in normal sterile orthopedic fashion.  A surgical pause was performed between the surgeons, anesthesia, and operating room staff and all were in agreement as to the patient, procedure, and site of procedure.  Tourniquet was placed and padded appropriately the left upper arm.  The arm was exsanguinated and the tourniquet was inflated to 250 mmHg. The previous volar incision at the wrist level was identified and was reopened with careful dissection through the scar tissue.  15 blade was utilized for the skin surface, incision was extended across the wrist crease in Patterson style fashion and into the palm.  There was noted to be significant scarring and thickened tissue over the median nerve.  We first identified the nerve proximally, release was performed in a proximal to distal fashion to prevent injury distally to the median nerve.  Antebrachial fascia was decompressed to the level of the transverse carpal ligament.  Median nerve was protected and the transverse carpal ligament was released.  There was noted to be significant scarring around the nerve circumferentially, requiring neurolysis and release of scar adhesions.  We were able to identify the distal fat pad indicating appropriate distal decompression of the median nerve.  We then turned our attention to plate removal.  Standard FCR approach was once again  utilized, neurovascular structures were  carefully identified and protected.  Median nerve which had been previously released was also retracted gently and protected.  Notable scarring was appreciated.  Scar tissue was mobilized to expose the underlying distal radius plate.  Distal and proximal screws were removed atraumatically followed by removal of the distal radius plate.  All hardware had been removed appropriately, this was confirmed with fluoroscopy.  Screw holes were then carefully debrided, rongeur was utilized as well.   Once we were satisfied with our hardware removal and proximal and distal decompression of the median nerve, decision was made to proceed with the planned hypothenar fat pad flap.  A vascularized fat flap was elevated, ensuring adequate blood supply from the hypothenar region of the hand.  The flap was transposed and positioned over the median nerve to provide cushioning and prevent recurrent scarring.  The flap was secured in place utilizing 3-0 Vicryl suture in figure-of-eight fashion to the radial sided tissue.  Freer elevator was inserted underneath the fat flap as it was secured to ensure that the nerve was not overly compressed.   At this juncture, we turned our attention to the ulnar shortening osteotomy.  Longitudinal incision was performed over the ulnar aspect of the wrist.  Skin flaps were elevated, care was taken to protect underlying neurovascular structures.  The interval between the ECU and FCU tendons was utilized to expose the underlying ulnar.  Bone was exposed, periosteum was elevated.  TriMed ulnar shortening plate was then selected.  Plate was found to be fitting to bone volarly, this was temporarily clamped in place.  Biplanar fluoroscopy was utilized to confirm appropriate plate placement within the distal ulnar shaft.   Plate was then secured proximally and distally.  The cutting jig was utilized to shorten the ulna 5 mm as planned preoperatively.  Oblique osteotomy cut was performed,  partially-threaded screw was then inserted via the plate guide to create compression across the osteotomy site.  Excellent stability was noted.  Plate was well secured proximally and distally with bicortical nonlocking screws.  Biplanar fluoroscopy was utilized to confirm appropriate plate placement as well as ulnar shortening.  Ulnar positivity had been corrected to neutral variance.   The tourniquet was deflated at 87 minutes.  Fingertips were pink with brisk capillary refill after deflation of tourniquet.  Copious irrigation was performed of the volar incision site and ulnar shortening osteotomy site.  Ulnar incision was closed in layers utilizing 3-0 Vicryl for the periosteal layer to create appropriate coverage over the ulnar plate.  This was followed by 4-0 nylon for the skin surface.  3-0 Vicryl was also utilized for subcutaneous layer on the volar aspect of the hand and wrist followed by 4-0 nylon in horizontal mattress fashion for the skin surface.  Sterile dressings were applied followed by application of a volar and dorsal plaster splint for the wrist.  Sling was also applied.  The operative drapes were broken down.  The patient was awoken from anesthesia safely and taken to PACU in stable condition.   Post-operative plan: The patient will recover in the post-anesthesia care unit and then be discharged home.  The patient will be non weight bearing on the left upper extremity in a short arm splint, clamshell style.   I will see the patient back in the office in 2 weeks for postoperative followup.  Discharge instructions provided for dressing maintenance and pain control.  Alon Mazor, MD Electronically signed, 12/16/23

## 2023-12-16 NOTE — Discharge Instructions (Addendum)
 Hand Surgery Postop Instructions   Dressings: Maintain postoperative dressing until orthopedic follow-up.  Keep operative site clean and dry until orthopedic follow-up.  Wound Care: Keep your hand elevated above the level of your heart.  Do not allow it to dangle by your side. Moving your fingers is advised to stimulate circulation but will depend on the site of your surgery.  If you have a splint applied, your doctor will advise you regarding movement.  Activity: Do not drive or operate machinery until clearance given from physician. No heavy lifting with operative extremity.  Diet:  Drink liquids today or eat a light diet.  You may resume a regular diet tomorrow.    General expectations: Take prescribed medication if given, transition to over-the-counter medication as quickly as possible. Fingers may become slightly swollen.  Call your doctor if any of the following occur: Severe pain not relieved by pain medication. Elevated temperature. Dressing soaked with blood. Inability to move fingers. White or bluish color to fingers.   Per [facility] clinic policy, our goal is ensure optimal postoperative pain control with a multimodal pain management strategy. For all [facility] patients, our goal is to wean post-operative narcotic medications by 6 weeks post-operatively. If this is not possible due to utilization of pain medication prior to surgery, your [facility] doctor will support your acute post-operative pain control for the first 6 weeks postoperatively, with a plan to transition you back to your primary pain team following that. Cyndia Skeeters will work to ensure a Therapist, occupational.  Keelin Neville Trevor Mace, M.D. Hand Surgery [facility]     Post Anesthesia Home Care Instructions  Activity: Get plenty of rest for the remainder of the day. A responsible individual must stay with you for 24 hours following the procedure.  For the next 24 hours, DO NOT: -Drive a  car -Advertising copywriter -Drink alcoholic beverages -Take any medication unless instructed by your physician -Make any legal decisions or sign important papers.  Meals: Start with liquid foods such as gelatin or soup. Progress to regular foods as tolerated. Avoid greasy, spicy, heavy foods. If nausea and/or vomiting occur, drink only clear liquids until the nausea and/or vomiting subsides. Call your physician if vomiting continues.  Special Instructions/Symptoms: Your throat may feel dry or sore from the anesthesia or the breathing tube placed in your throat during surgery. If this causes discomfort, gargle with warm salt water. The discomfort should disappear within 24 hours.   Regional Anesthesia Blocks  1. You may not be able to move or feel the "blocked" extremity after a regional anesthetic block. This may last may last from 3-48 hours after placement, but it will go away. The length of time depends on the medication injected and your individual response to the medication. As the nerves start to wake up, you may experience tingling as the movement and feeling returns to your extremity. If the numbness and inability to move your extremity has not gone away after 48 hours, please call your surgeon.   2. The extremity that is blocked will need to be protected until the numbness is gone and the strength has returned. Because you cannot feel it, you will need to take extra care to avoid injury. Because it may be weak, you may have difficulty moving it or using it. You may not know what position it is in without looking at it while the block is in effect.  3. For blocks in the legs and feet, returning to weight bearing and  walking needs to be done carefully. You will need to wait until the numbness is entirely gone and the strength has returned. You should be able to move your leg and foot normally before you try and bear weight or walk. You will need someone to be with you when you first try to  ensure you do not fall and possibly risk injury.  4. Bruising and tenderness at the needle site are common side effects and will resolve in a few days.  5. Persistent numbness or new problems with movement should be communicated to the surgeon or the [facility] [phone] [facility] [phone]).

## 2023-12-16 NOTE — Transfer of Care (Signed)
 Immediate Anesthesia Transfer of Care Note  Patient: Angela [name]  Procedure(s) Performed: Procedure(s) (LRB): CARPAL TUNNEL RELEASE (Left) SHORTENING, ULNA (Left) REMOVAL, HARDWARE (Left)  Patient Location: PACU  Anesthesia Type: MAC  Level of Consciousness: awake, alert , oriented and patient cooperative  Airway & Oxygen Therapy: Patient Spontanous Breathing and Patient connected to face mask oxygen  Post-op Assessment: Report given to PACU RN and Post -op Vital signs reviewed and stable  Post vital signs: Reviewed and stable  Complications: No apparent anesthesia complications Last Vitals:  Vitals Value Taken Time  BP 109/74 12/16/23 09:53  Temp    Pulse 93 12/16/23 09:55  Resp 13 12/16/23 09:55  SpO2 96 % 12/16/23 09:55  Vitals shown include unfiled device data.  Last Pain:  Vitals:   12/16/23 9366  TempSrc: Tympanic  PainSc: 0-No pain      Patients Stated Pain Goal: 6 (12/16/23 9366)  Complications: No notable events documented.

## 2023-12-16 NOTE — Anesthesia Procedure Notes (Signed)
 Procedure Name: MAC Date/Time: 12/16/2023 7:35 AM  Performed by: Delayne Olam BIRCH, CRNAPre-anesthesia Checklist: Patient identified, Emergency Drugs available, Suction available and Patient being monitored Oxygen Delivery Method: Simple face mask

## 2023-12-16 NOTE — Interval H&P Note (Signed)
 History and Physical Interval Note:  12/16/2023 6:30 AM  Angela [name]  has presented today for surgery, with the diagnosis of LEFT WRIST SYMPTOMATIC HARDWARE, ULNAR IMPACTION SYNDROME, CARPAL TUNNEL SYNDROME.  The various methods of treatment have been discussed with the patient and family. After consideration of risks, benefits and other options for treatment, the patient has consented to  Procedure(s) with comments: CARPAL TUNNEL RELEASE (Left) - LEFT WRIST REMOVAL OF HARDWAR/ OPEN CARPAL TUNNEL RELEASE WITH HYPOTHENAR FAT FLAP/ ULNAR SHORTENING OSTEOTOMY SHORTENING, ULNA (Left) REMOVAL, HARDWARE (Left) as a surgical intervention.  The patient's history has been reviewed, patient examined, no change in status, stable for surgery.  I have reviewed the patient's chart and labs.  Questions were answered to the patient's satisfaction.     Darran Gabay

## 2023-12-16 NOTE — Anesthesia Preprocedure Evaluation (Signed)
 Anesthesia Evaluation  Patient identified by MRN, date of birth, ID band Patient awake    Reviewed: Allergy & Precautions, H&P , NPO status , Patient's Chart, lab work & pertinent test results  History of Anesthesia Complications (+) PONV and history of anesthetic complications  Airway Mallampati: II  TM Distance: >3 FB Neck ROM: Full    Dental  (+) Dental Advisory Given   Pulmonary sleep apnea , former smoker   Pulmonary exam normal breath sounds clear to auscultation       Cardiovascular hypertension, Pt. on medications negative cardio ROS Normal cardiovascular exam Rhythm:Regular Rate:Normal     Neuro/Psych  Headaches  negative psych ROS   GI/Hepatic negative GI ROS, Neg liver ROS,,,  Endo/Other  Hypothyroidism    Renal/GU negative Renal ROS  negative genitourinary   Musculoskeletal  (+) Arthritis , Osteoarthritis,    Abdominal   Peds negative pediatric ROS (+)  Hematology negative hematology ROS (+)   Anesthesia Other Findings   Reproductive/Obstetrics negative OB ROS                              Anesthesia Physical Anesthesia Plan  ASA: 2  Anesthesia Plan: MAC   Post-op Pain Management: Regional block*   Induction: Intravenous  PONV Risk Score and Plan: 3 and Ondansetron , Propofol  infusion, Midazolam  and Treatment may vary due to age or medical condition  Airway Management Planned: Simple Face Mask  Additional Equipment:   Intra-op Plan:   Post-operative Plan:   Informed Consent: I have reviewed the patients History and Physical, chart, labs and discussed the procedure including the risks, benefits and alternatives for the proposed anesthesia with the patient or authorized representative who has indicated his/her understanding and acceptance.     Dental advisory given  Plan Discussed with: CRNA  Anesthesia Plan Comments:         Anesthesia Quick  Evaluation

## 2023-12-16 NOTE — Anesthesia Procedure Notes (Signed)
 Anesthesia Regional Block: Supraclavicular block   Pre-Anesthetic Checklist: , timeout performed,  Correct Patient, Correct Site, Correct Laterality,  Correct Procedure, Correct Position, site marked,  Risks and benefits discussed,  Surgical consent,  Pre-op evaluation,  At surgeon's request and post-op pain management  Laterality: Left  Prep: chloraprep       Needles:  Injection technique: Single-shot  Needle Type: Stimiplex     Needle Length: 9cm  Needle Gauge: 21     Additional Needles:   Procedures:,,,, ultrasound used (permanent image in chart),,    Narrative:  Start time: 12/16/2023 7:10 AM End time: 12/16/2023 7:15 AM Injection made incrementally with aspirations every 5 mL.  Performed by: Personally  Anesthesiologist: Cleotilde Butler Dade, MD

## 2023-12-16 NOTE — Anesthesia Postprocedure Evaluation (Signed)
 Anesthesia Post Note  Patient: Angela [name]  Procedure(s) Performed: CARPAL TUNNEL RELEASE (Left: Wrist) SHORTENING, ULNA (Left: Arm Lower) REMOVAL, HARDWARE (Left: Arm Lower)     Patient location during evaluation: PACU Anesthesia Type: MAC Level of consciousness: awake and alert Pain management: pain level controlled Vital Signs Assessment: post-procedure vital signs reviewed and stable Respiratory status: spontaneous breathing, nonlabored ventilation and respiratory function stable Cardiovascular status: blood pressure returned to baseline and stable Postop Assessment: no apparent nausea or vomiting Anesthetic complications: no   No notable events documented.  Last Vitals:  Vitals:   12/16/23 1030 12/16/23 1045  BP: 118/78 125/69  Pulse: 75 80  Resp: 13 15  Temp:  36.6 C  SpO2: 95% 95%    Last Pain:  Vitals:   12/16/23 1045  TempSrc:   PainSc: 0-No pain                 Butler Levander Pinal

## 2023-12-20 ENCOUNTER — Encounter (HOSPITAL_BASED_OUTPATIENT_CLINIC_OR_DEPARTMENT_OTHER): Payer: Self-pay | Admitting: Orthopedic Surgery

## 2023-12-22 ENCOUNTER — Telehealth: Payer: Self-pay | Admitting: Orthopedic Surgery

## 2023-12-22 ENCOUNTER — Other Ambulatory Visit: Payer: Self-pay | Admitting: Orthopedic Surgery

## 2023-12-22 MED ORDER — OXYCODONE HCL 5 MG PO TABS: 5.0000 mg | ORAL_TABLET | Freq: Four times a day (QID) | ORAL | 0 refills | Status: AC | PRN

## 2023-12-22 NOTE — Telephone Encounter (Signed)
 Pt called saying that she needs a refill of her meds because she is going to run out over the weekend. Pharmacy is [facility] on [address] in [place]. Call back number is [phone].

## 2023-12-23 NOTE — Telephone Encounter (Signed)
 Meds called in yesterday

## 2023-12-28 ENCOUNTER — Ambulatory Visit: Admitting: Orthopedic Surgery

## 2023-12-28 ENCOUNTER — Other Ambulatory Visit: Payer: Self-pay

## 2023-12-28 DIAGNOSIS — M25532 Pain in left wrist: Secondary | ICD-10-CM

## 2023-12-28 DIAGNOSIS — Z8781 Personal history of (healed) traumatic fracture: Secondary | ICD-10-CM

## 2023-12-28 DIAGNOSIS — Z9889 Other specified postprocedural states: Secondary | ICD-10-CM

## 2023-12-28 NOTE — Progress Notes (Unsigned)
° °  Angela [name] - 78 y.o. female MRN [id number]  Date of birth: June 17, 1945  Office Visit Note: Visit Date: 12/28/2023 PCP: Gerome Brunet, DO Referred by: Gerome Brunet, DO  Subjective:  HPI: Angela [name] is a 78 y.o. female who presents today for follow up 2 weeks status post left wrist hardware removal, deep implant. Left wrist open extended carpal tunnel release with hypothenar fat flap. Left wrist ulnar shortening osteotomy.  Pertinent ROS were reviewed with the patient and found to be negative unless otherwise specified above in HPI.   Assessment & Plan: Visit Diagnoses: No diagnosis found.  Plan: ***  Follow-up: No follow-ups on file.   Meds & Orders: No orders of the defined types were placed in this encounter.  No orders of the defined types were placed in this encounter.    Procedures: No procedures performed       Objective:   Vital Signs: There were no vitals taken for this visit.  Ortho Exam ***  Imaging: No results found.   Orie Cuttino Afton Alderton, M.D. [facility], Hand Surgery

## 2024-01-10 NOTE — Therapy (Signed)
 " OUTPATIENT OCCUPATIONAL THERAPY ORTHO EVALUATION  Patient Name: Angela [name] MRN: [id number] DOB:14-Nov-1945, 78 y.o., female Today's Date: 01/16/2024  PCP: Gerome BIRCH, DO  REFERRING PROVIDER: Dr. Arlinda   END OF SESSION:  OT End of Session - 01/16/24 1106     Visit Number 1    Number of Visits 10    Date for Recertification  03/02/24    Authorization Type Medicare    OT Start Time 1106    OT Stop Time 1154    OT Time Calculation (min) 48 min    Equipment Utilized During Treatment orthotic materials    Activity Tolerance Patient tolerated treatment well;No increased pain;Patient limited by pain;Patient limited by fatigue    Behavior During Therapy [facility] for tasks assessed/performed          Past Medical History:  Diagnosis Date   Attention deficit hyperactivity disorder (ADHD), predominantly inattentive type 07/25/2019   Cancer (HCC)    skin   Chronic pain    Chronic, continuous use of opioids 10/12/2016   Complication of anesthesia    COVID 2023   Difficult intubation    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 to very limited mouth opening   Headache    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/12/2016   Hyperprolactinemia 07/09/2019   Hypertension    Hypothyroidism    Irritable bowel syndrome 07/25/2019   Joint disorder    Mitral valve prolapse    dx'd years ago - no issues   OSA on CPAP 05/26/2020   Osteoporosis 07/09/2019   Other retention of urine 10/12/2016   Other spondylosis with radiculopathy, cervical region 11/03/2020   Pneumonia    PONV (postoperative nausea and vomiting)    Porphyria (HCC)    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)   Primary osteoarthritis, right shoulder 11/16/2022   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   Sleep apnea    no CPAP   Squamous cell carcinoma of skin 09/23/2022   Left Middle Medial Pretibial, [facility]   Squamous cell carcinoma of skin 09/23/2022   Right Lateral Knee Superior, [facility]    Thyroid  dysfunction 07/25/2019   Trochanteric bursitis, left hip 06/28/2017   Urethral caruncle 07/25/2019   Urinary urgency 09/08/2022   Vaginal atrophy 10/12/2016   Wears contact lenses    Past Surgical History:  Procedure Laterality Date   ABDOMINAL HYSTERECTOMY     Age 71   ANKLE SURGERY     x3   APPENDECTOMY     BIOPSY  10/19/2018   Procedure: BIOPSY;  Surgeon: Saintclair Jasper, MD;  Location: [facility];  Service: Gastroenterology;;   BIOPSY  04/11/2020   Procedure: BIOPSY;  Surgeon: Saintclair Jasper, MD;  Location: [facility];  Service: Gastroenterology;;   CARPAL TUNNEL RELEASE Left 12/16/2023   Procedure: CARPAL TUNNEL RELEASE;  Surgeon: Arlinda Buster, MD;  Location: [facility];  Service: Orthopedics;  Laterality: Left;  LEFT WRIST REMOVAL OF HARDWAR/ OPEN CARPAL TUNNEL RELEASE WITH HYPOTHENAR FAT FLAP/ ULNAR SHORTENING OSTEOTOMY   COLONOSCOPY WITH PROPOFOL  N/A 10/19/2018   Procedure: COLONOSCOPY WITH PROPOFOL ;  Surgeon: Saintclair Jasper, MD;  Location: [facility];  Service: Gastroenterology;  Laterality: N/A;   COLONOSCOPY WITH PROPOFOL  N/A 04/11/2020   Procedure: COLONOSCOPY WITH PROPOFOL ;  Surgeon: Saintclair Jasper, MD;  Location: [facility];  Service: Gastroenterology;  Laterality: N/A;  (difficulty airway)    DILATION AND CURETTAGE OF UTERUS     x 4   ESOPHAGOGASTRODUODENOSCOPY (EGD) WITH PROPOFOL  N/A 10/19/2018   Procedure: ESOPHAGOGASTRODUODENOSCOPY (EGD) WITH  PROPOFOL ;  Surgeon: Saintclair Jasper, MD;  Location: [facility];  Service: Gastroenterology;  Laterality: N/A;   FRACTURE SURGERY     jaw   HARDWARE REMOVAL Left 12/16/2023   Procedure: REMOVAL, HARDWARE;  Surgeon: Arlinda Buster, MD;  Location: [facility];  Service: Orthopedics;  Laterality: Left;   KNEE SURGERY     x 3   KYPHOPLASTY N/A 08/07/2019   Procedure: Thoracic twelve Vertebral Augmentation with Stenting/SpineJack;  Surgeon: Debby Dorn MATSU, MD;  Location: [facility];  Service: Neurosurgery;   Laterality: N/A;   MANDIBLE FRACTURE SURGERY     x 19   ORIF WRIST FRACTURE Left 03/16/2023   Procedure: OPEN REDUCTION INTERNAL FIXATION OF LEFT WRIST.;  Surgeon: Edie Norleen PARAS, MD;  Location: [facility];  Service: Orthopedics;  Laterality: Left;   POLYPECTOMY  10/19/2018   Procedure: POLYPECTOMY;  Surgeon: Saintclair Jasper, MD;  Location: [facility];  Service: Gastroenterology;;   POLYPECTOMY  04/11/2020   Procedure: POLYPECTOMY;  Surgeon: Saintclair Jasper, MD;  Location: [facility];  Service: Gastroenterology;;   ULNA OSTEOTOMY Left 12/16/2023   Procedure: TANDA MORITA;  Surgeon: Arlinda Buster, MD;  Location: [facility];  Service: Orthopedics;  Laterality: Left;   Patient Active Problem List   Diagnosis Date Noted   Retained orthopedic hardware 12/16/2023   Ulnar impaction syndrome, left 12/16/2023   Carpal tunnel syndrome, left upper limb 12/16/2023   History of radiation therapy 06/09/2023   History of endocrine disorder 06/09/2023   History of adenomatous polyp of colon 06/09/2023   Hashimoto's thyroiditis 06/09/2023   Chronic venous insufficiency 06/09/2023   Porphyria cutanea tarda (HCC) 06/09/2023   Vitamin D deficiency 06/09/2023   Closed displaced fracture of styloid process of left ulna 03/17/2023   Closed Colles' fracture of left radius 03/17/2023   Primary osteoarthritis, right shoulder 11/16/2022   Right arm pain 09/08/2022   Urinary urgency 09/08/2022   Weakness 09/08/2022   Other spondylosis with radiculopathy, cervical region 11/03/2020   Hypothyroidism due to Hashimoto's thyroiditis 05/28/2020   Low serum cortisol level 05/28/2020   OSA on CPAP 05/26/2020   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   Attention deficit hyperactivity disorder, predominantly inattentive type 07/25/2019   Chronic pain syndrome 07/25/2019   Irritable bowel syndrome 07/25/2019   Urethral caruncle 07/25/2019   Thyroid  dysfunction 07/25/2019   Osteoporosis  07/09/2019   Hyperprolactinemia 07/09/2019   Trochanteric bursitis, left hip 06/28/2017   Chronic, continuous use of opioids 10/12/2016   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/12/2016   Other retention of urine 10/12/2016   Vaginal atrophy 10/12/2016   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  She states her wrist fx was Feb 2025 and she had lingering wrist pain, positive ulnar variance, numbness in median nerve and fnl problems.   States numbness is much improved already as well as pain   Pt accompanied by: daughter   PERTINENT HISTORY: Approximately 69-year-old broken wrist with  issues leading to surgery, no other significant issues related to this problem  PRECAUTIONS: None  RED FLAGS: None   WEIGHT BEARING RESTRICTIONS: Yes no significant or heavy weightbearing through the left arm now and for the next 2 to 4 weeks  PAIN:  Are you having pain? Yes: NPRS scale: 2/10 at rest now  Pain location: Lt wrist sx area Pain description: Aching and sore Aggravating factors: Aggressive motion or weightbearing Relieving factors: Rest  FALLS: Has patient fallen in last 6 months? No   PLOF: Independent  PATIENT GOALS: Safely improve use of the left hand and wrist and arm for home and functional tasks  NEXT MD VISIT: As needed   OBJECTIVE: (All objective assessments below are from initial  evaluation on: 01/16/24 unless otherwise specified.)   HAND DOMINANCE: Right   ADLs: Overall ADLs: States decreased ability to grab, hold household objects, pain and difficulty to open containers, perform FMS tasks (manipulate fasteners on clothing), mild to moderate bathing problems as well.    FUNCTIONAL OUTCOME MEASURES: Eval: Patient Specific Functional Scale: TBD as time allows and accession (Higher Score  =  Better Ability for the Selected Tasks)      UPPER EXTREMITY ROM     Shoulder to Wrist AROM Left eval  Elbow flexion WNL  Elbow extension WNL  Forearm supination 64  Forearm pronation  52  Wrist flexion 30  Wrist extension 38  Wrist ulnar deviation   Wrist radial deviation   (Blank rows = not tested)   Hand AROM Left eval  Full Fist Ability (or Gap to Distal Palmar Crease) Full fist  Thumb Opposition  (Kapandji Scale)  7/10  (Blank rows = not tested)   UPPER EXTREMITY MMT:    Eval:  NT at eval due to recent and still healing injuries. Will be tested when appropriate.   MMT Left TBD  Forearm supination   Forearm pronation   Wrist flexion   Wrist extension   Wrist ulnar deviation   Wrist radial deviation   (Blank rows = not tested)  HAND FUNCTION: Eval: Observed weakness in affected left hand.  Details TBD when safe Grip strength Right: TBD lbs, Left: TBD lbs   COORDINATION: Eval: Observed coordination impairments with affected left hand.  Details TBD when able 9 Hole Peg Test Left: TBD sec (TBD sec is WFL)   SENSATION: Eval:  Light touch intact today  EDEMA:   Eval:  Mildly swollen in left hand and wrist today, compared to the right  COGNITION: Eval: Overall cognitive status: WFL for evaluation today   OBSERVATIONS:   Eval: Surgical incisions are healing up nicely now at 4 weeks.  Not overtly hypersensitive anywhere, swelling is mild and improving   Left ulnar shortening ostomy, carpal tunnel release, removal of hardware  TODAY'S TREATMENT:   Post-evaluation treatment:    Custom orthotic fabrication was indicated due to pt's healing left arm ulnar shortening ostomy and carpal tunnel release and need for safe, functional positioning. OT fabricated custom [place] orthosis for pt today to limit forearm rotation, immobilize wrist motion and allow for finger and thumb motion as well as controlled elbow motion. It fit well with no areas of pressure, pt states a comfortable fit. Pt was educated on the wearing schedule (on at all times except for hygiene and exercises, also short periods of light functional activities (<3lbs)  several times a day), to avoid exposing it to sources of heat, to wipe clean as needed (do not wash, use harsh  detergents), to call or come in ASAP if it is causing any irritation or is not achieving desired function. It will be checked/adjusted in upcoming sessions, as needed. Pt states understanding all directions.    For safety/self-care, the patient was recommended to do no pushing/pulling or weightbearing through the left hand or arm now.  The patient was taught to care for the postsurgical wound by doing light touch desensitization, gentle scar mobilizations, keep moist with a Vaseline, keep covered until completely healed.  The patient should not be soaking the wound, either.  The patient can quickly shower and dab dry.  The patient was given a compressive stockinette to help with swelling.  The patient was also given the following home exercise program to perform in a nonpainful fashion approximately 4 times a day.  Each one was reviewed with the patient, with performance back to show understanding and no significant pain.  She can also begin taking off the orthosis 2-3 times a day for 5 to 10 minutes at a time for light functional activities that way less than 2 to 3 pounds.  The patient leaves without any questions or concerns.  Exercises - Reach arms upward   - 4 x daily - 10 reps - Standing Elbow Flexion Extension  AROM  - 4-6 x daily - 10-15 reps - Turn [employer] Facing Up & Down  - 4-6 x daily - 10-15 reps - Bend and Pull Back Wrist SLOWLY  - 4 x daily - 10-15 reps - Windshield Wipers   - 4 x daily - 10-15 reps - Tendon Glides  - 4-6 x daily - 3-5 reps - 2-3 seconds hold - Thumb Opposition  - 4-6 x daily - 10 reps - Median Nerve Flossing  - 2-3 x daily - 5-10 reps   PATIENT EDUCATION: Education details: See tx section above for details  Person educated: Patient Education method: Verbal Instruction, Teach back, Handouts  Education comprehension: States and demonstrates understanding, Additional Education required    HOME EXERCISE PROGRAM: Access Code: [email or web address] URL: [email or web address] Date: 01/16/2024 Prepared by: Melvenia Ada   GOALS: Goals reviewed with patient? Yes   SHORT TERM GOALS: (STG required if POC>30 days) Target Date: 02/03/2024  Pt will obtain protective, custom orthotic. Goal status: 01/16/2024: MET   2.  Pt will demo/state understanding of initial HEP to improve pain levels and prerequisite motion. Goal status: INITIAL   LONG TERM GOALS: Target Date: 03/02/2024  Pt will improve functional ability by decreased impairment per PSFS assessment from TBD to TBD or better, for better quality of life. Goal status: INITIAL pending baseline measures  2.  Pt will improve grip strength in left hand from unsafe to test to at least 20 lbs for functional use at home and in IADLs. Goal status: INITIAL  3.  Pt will improve A/ROM in left wrist flexion/extension from 30/38 degrees respectively to at least 55 degrees each, to have functional motion for tasks like reach and grasp.  Goal status: INITIAL  4.  Pt will improve strength in left forearm supination/pronation from apparent 3 -/5 MMT to at least 4+/5 MMT to have increased functional ability to carry out selfcare and higher-level homecare tasks with less difficulty. Goal status: INITIAL  5.  Pt will improve  coordination skills in left hand and arm, as seen by within functional limit score on coordination testing to have increased functional ability to carry out fine motor tasks (fasteners, etc.) and more complex, coordinated IADLs (meal prep, sports, etc.).  Goal status: INITIAL  ASSESSMENT:  CLINICAL IMPRESSION: Patient is a 78y.o. female who was seen today for occupational therapy evaluation for stiffness, swelling, pain, decreased functional ability after left arm ulnar shortening ostomy, carpal tunnel release, removal of hardware.  The symptoms are improving and are already better than prior to surgery due to her chronic wrist fracture and misaligned distal radial ulnar joint.  The patient will benefit from outpatient occupational therapy to decrease symptoms, improve functional upper extremity use, and increase quality of life.  PERFORMANCE DEFICITS: in functional skills including ADLs, IADLs, coordination, edema, ROM, strength, pain, fascial restrictions, Gross motor control, body mechanics, endurance, decreased knowledge of precautions, wound, and UE functional use, cognitive skills including problem solving and safety awareness, and psychosocial skills including coping strategies, environmental adaptation, habits, and routines and behaviors.   IMPAIRMENTS: are limiting patient from ADLs, IADLs, rest and sleep, and leisure.   COMORBIDITIES: may have co-morbidities  that affects occupational performance. Patient will benefit from skilled OT to address above impairments and improve overall function.  MODIFICATION OR ASSISTANCE TO COMPLETE EVALUATION: No modification of tasks or assist necessary to complete an evaluation.  OT OCCUPATIONAL PROFILE AND HISTORY: Detailed assessment: Review of records and additional review of physical, cognitive, psychosocial history related to current functional performance.  CLINICAL DECISION MAKING: Moderate - several treatment options, min-mod task modification  necessary  REHAB POTENTIAL: Good  EVALUATION COMPLEXITY: Low      PLAN:  OT FREQUENCY: 1-2x/week  OT DURATION: 6 weeks through 03/02/2024 and up to 10 total visits as needed   PLANNED INTERVENTIONS: 97535 self care/ADL training, 02889 therapeutic exercise, 97530 therapeutic activity, 97112 neuromuscular re-education, 97140 manual therapy, 97035 ultrasound, 97032 electrical stimulation (manual), 97760 Orthotic Initial, [id number] Orthotic/Prosthetic subsequent, compression bandaging, Dry needling, energy conservation, coping strategies training, and patient/family education  RECOMMENDED OTHER SERVICES: none now    CONSULTED AND AGREED WITH PLAN OF CARE: Patient  PLAN FOR NEXT SESSION:   Review initial HEP and recommendations, check orthosis and consider trimming to forearm-based as soon as tolerated   Melvenia Ada, OTR/L, CHT  01/16/2024, 6:40 PM   "

## 2024-01-11 ENCOUNTER — Encounter: Admitting: Orthopedic Surgery

## 2024-01-16 ENCOUNTER — Ambulatory Visit (INDEPENDENT_AMBULATORY_CARE_PROVIDER_SITE_OTHER): Admitting: Orthopedic Surgery

## 2024-01-16 ENCOUNTER — Encounter: Payer: Self-pay | Admitting: Rehabilitative and Restorative Service Providers"

## 2024-01-16 ENCOUNTER — Ambulatory Visit (INDEPENDENT_AMBULATORY_CARE_PROVIDER_SITE_OTHER): Admitting: Rehabilitative and Restorative Service Providers"

## 2024-01-16 ENCOUNTER — Other Ambulatory Visit (INDEPENDENT_AMBULATORY_CARE_PROVIDER_SITE_OTHER)

## 2024-01-16 DIAGNOSIS — Z8781 Personal history of (healed) traumatic fracture: Secondary | ICD-10-CM

## 2024-01-16 DIAGNOSIS — M25632 Stiffness of left wrist, not elsewhere classified: Secondary | ICD-10-CM

## 2024-01-16 DIAGNOSIS — Z9889 Other specified postprocedural states: Secondary | ICD-10-CM

## 2024-01-16 DIAGNOSIS — R278 Other lack of coordination: Secondary | ICD-10-CM | POA: Diagnosis not present

## 2024-01-16 DIAGNOSIS — M25532 Pain in left wrist: Secondary | ICD-10-CM

## 2024-01-16 DIAGNOSIS — R600 Localized edema: Secondary | ICD-10-CM | POA: Diagnosis not present

## 2024-01-16 DIAGNOSIS — M6281 Muscle weakness (generalized): Secondary | ICD-10-CM

## 2024-01-16 NOTE — Progress Notes (Signed)
" ° °  Angela [name] - 79 y.o. female MRN [id number]  Date of birth: 02-06-1945  Office Visit Note: Visit Date: 01/16/2024 PCP: Gerome Brunet, DO Referred by: Gerome Brunet, DO  Subjective:  HPI: Angela [name] is a 79 y.o. female who presents today for follow up 4 weeks status post left wrist hardware removal, deep implant. Left wrist open extended carpal tunnel release with hypothenar fat flap. Left wrist ulnar shortening osteotomy.  She is doing very well overall, pain is well-controlled.  Has been compliant with casting as instructed.  Pertinent ROS were reviewed with the patient and found to be negative unless otherwise specified above in HPI.   Assessment & Plan: Visit Diagnoses:  1. S/P ORIF (open reduction internal fixation) fracture     Plan: Cast removed today, wounds are well-healing.  X-rays demonstrate stable appearance of the wrist status post ulnar shortening osteotomy.  She will be seen by occupational therapy today for fabrication of an orthosis.  She can begin gentle range of motion exercises of the wrist and hand.  I will plan on seeing her back in 4 weeks to track her progress.  Follow-up: No follow-ups on file.   Meds & Orders: No orders of the defined types were placed in this encounter.   Orders Placed This Encounter  Procedures   XR Wrist Complete Left     Procedures: No procedures performed       Objective:   Vital Signs: There were no vitals taken for this visit.  Ortho Exam Left upper extremity: - Well-healing volar incision, well-healing ulnar incision, skin edges well-approximated without erythema or drainage - Digital range of motion is preserved, minimal swelling - Sensation intact to light touch median/radial/ulnar, AIN/PIN/interosseous intact  Imaging: XR Wrist Complete Left Result Date: 01/16/2024 X-rays of the left wrist demonstrate stable appearance of the ulnar osteotomy plate, hardware remains well fixated in all planes without evidence of  failure or migration.  Prior distal radius screw holes demonstrate some interval filling in of the defects.    Eugine Bubb Afton Alderton, M.D.  [facility]  "

## 2024-01-19 ENCOUNTER — Telehealth: Payer: Self-pay | Admitting: Orthopedic Surgery

## 2024-01-19 NOTE — Telephone Encounter (Signed)
 Pt called stating she has medical questions about her appt and therapy. Please call pt at [phone].

## 2024-01-19 NOTE — Telephone Encounter (Signed)
 Called pt and answered her questions about OT. Pt was wondering if she needed to wear the splint full time and if it was okay to start OT. I advised that yes she does have to wear the splint at all times and it is okay to start therapy.

## 2024-01-24 NOTE — Therapy (Signed)
 " OUTPATIENT OCCUPATIONAL THERAPY TREATMENT NOTE  Patient Name: Angela [name] MRN: [id number] DOB:[birth year], 79 y.o., female Today's Date: 01/25/2024  PCP: Gerome BIRCH, DO  REFERRING PROVIDER: Dr. Arlinda   END OF SESSION:  OT End of Session - 01/25/24 1301     Visit Number 2    Number of Visits 10    Date for Recertification  03/02/24    Authorization Type Medicare    OT Start Time 1301    OT Stop Time 1345    OT Time Calculation (min) 44 min    Equipment Utilized During Treatment orthotic materials    Activity Tolerance Patient tolerated treatment well;No increased pain;Patient limited by pain;Patient limited by fatigue    Behavior During Therapy [facility] for tasks assessed/performed           Past Medical History:  Diagnosis Date   Attention deficit hyperactivity disorder (ADHD), predominantly inattentive type 07/25/2019   Cancer (HCC)    skin   Chronic pain    Chronic, continuous use of opioids 10/12/2016   Complication of anesthesia    COVID 2023   Difficult intubation    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 to very limited mouth opening   Headache    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/12/2016   Hyperprolactinemia 07/09/2019   Hypertension    Hypothyroidism    Irritable bowel syndrome 07/25/2019   Joint disorder    Mitral valve prolapse    dx'd years ago - no issues   OSA on CPAP 05/26/2020   Osteoporosis 07/09/2019   Other retention of urine 10/12/2016   Other spondylosis with radiculopathy, cervical region 11/03/2020   Pneumonia    PONV (postoperative nausea and vomiting)    Porphyria (HCC)    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)   Primary osteoarthritis, right shoulder 11/16/2022   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   Sleep apnea    no CPAP   Squamous cell carcinoma of skin 09/23/2022   Left Middle Medial Pretibial, EDC   Squamous cell carcinoma of skin 09/23/2022   Right Lateral Knee Superior, EDC    Thyroid  dysfunction 07/25/2019   Trochanteric bursitis, left hip 06/28/2017   Urethral caruncle 07/25/2019   Urinary urgency 09/08/2022   Vaginal atrophy 10/12/2016   Wears contact lenses    Past Surgical History:  Procedure Laterality Date   ABDOMINAL HYSTERECTOMY     Age 32   ANKLE SURGERY     x3   APPENDECTOMY     BIOPSY  10/19/2018   Procedure: BIOPSY;  Surgeon: Saintclair Jasper, MD;  Location: [facility];  Service: Gastroenterology;;   BIOPSY  04/11/2020   Procedure: BIOPSY;  Surgeon: Saintclair Jasper, MD;  Location: [facility];  Service: Gastroenterology;;   CARPAL TUNNEL RELEASE Left 12/16/2023   Procedure: CARPAL TUNNEL RELEASE;  Surgeon: Arlinda Buster, MD;  Location: [facility];  Service: Orthopedics;  Laterality: Left;  LEFT WRIST REMOVAL OF HARDWAR/ OPEN CARPAL TUNNEL RELEASE WITH HYPOTHENAR FAT FLAP/ ULNAR SHORTENING OSTEOTOMY   COLONOSCOPY WITH PROPOFOL  N/A 10/19/2018   Procedure: COLONOSCOPY WITH PROPOFOL ;  Surgeon: Saintclair Jasper, MD;  Location: [facility];  Service: Gastroenterology;  Laterality: N/A;   COLONOSCOPY WITH PROPOFOL  N/A 04/11/2020   Procedure: COLONOSCOPY WITH PROPOFOL ;  Surgeon: Saintclair Jasper, MD;  Location: [facility];  Service: Gastroenterology;  Laterality: N/A;  (difficulty airway)    DILATION AND CURETTAGE OF UTERUS     x 4   ESOPHAGOGASTRODUODENOSCOPY (EGD) WITH PROPOFOL  N/A 10/19/2018   Procedure: ESOPHAGOGASTRODUODENOSCOPY (EGD)  WITH PROPOFOL ;  Surgeon: Saintclair Jasper, MD;  Location: [facility];  Service: Gastroenterology;  Laterality: N/A;   FRACTURE SURGERY     jaw   HARDWARE REMOVAL Left 12/16/2023   Procedure: REMOVAL, HARDWARE;  Surgeon: Arlinda Buster, MD;  Location: [facility];  Service: Orthopedics;  Laterality: Left;   KNEE SURGERY     x 3   KYPHOPLASTY N/A 08/07/2019   Procedure: Thoracic twelve Vertebral Augmentation with Stenting/SpineJack;  Surgeon: Debby Dorn MATSU, MD;  Location: [facility];  Service: Neurosurgery;   Laterality: N/A;   MANDIBLE FRACTURE SURGERY     x 19   ORIF WRIST FRACTURE Left 03/16/2023   Procedure: OPEN REDUCTION INTERNAL FIXATION OF LEFT WRIST.;  Surgeon: Edie Norleen PARAS, MD;  Location: [facility];  Service: Orthopedics;  Laterality: Left;   POLYPECTOMY  10/19/2018   Procedure: POLYPECTOMY;  Surgeon: Saintclair Jasper, MD;  Location: [facility];  Service: Gastroenterology;;   POLYPECTOMY  04/11/2020   Procedure: POLYPECTOMY;  Surgeon: Saintclair Jasper, MD;  Location: [facility];  Service: Gastroenterology;;   ULNA OSTEOTOMY Left 12/16/2023   Procedure: TANDA MORITA;  Surgeon: Arlinda Buster, MD;  Location: [facility];  Service: Orthopedics;  Laterality: Left;   Patient Active Problem List   Diagnosis Date Noted   Retained orthopedic hardware 12/16/2023   Ulnar impaction syndrome, left 12/16/2023   Carpal tunnel syndrome, left upper limb 12/16/2023   History of radiation therapy 06/09/2023   History of endocrine disorder 06/09/2023   History of adenomatous polyp of colon 06/09/2023   Hashimoto's thyroiditis 06/09/2023   Chronic venous insufficiency 06/09/2023   Porphyria cutanea tarda (HCC) 06/09/2023   Vitamin D deficiency 06/09/2023   Closed displaced fracture of styloid process of left ulna 03/17/2023   Closed Colles' fracture of left radius 03/17/2023   Primary osteoarthritis, right shoulder 11/16/2022   Right arm pain 09/08/2022   Urinary urgency 09/08/2022   Weakness 09/08/2022   Other spondylosis with radiculopathy, cervical region 11/03/2020   Hypothyroidism due to Hashimoto's thyroiditis 05/28/2020   Low serum cortisol level 05/28/2020   OSA on CPAP 05/26/2020   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   Attention deficit hyperactivity disorder, predominantly inattentive type 07/25/2019   Chronic pain syndrome 07/25/2019   Irritable bowel syndrome 07/25/2019   Urethral caruncle 07/25/2019   Thyroid  dysfunction 07/25/2019   Osteoporosis  07/09/2019   Hyperprolactinemia 07/09/2019   Trochanteric bursitis, left hip 06/28/2017   Chronic, continuous use of opioids 10/12/2016   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/12/2016   Other retention of urine 10/12/2016   Vaginal atrophy 10/12/2016   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   New onset headache 01/19/2016   Excessive sleepiness 01/19/2016   HYPERTENSION, BENIGN 03/10/2009   HYPERTENSION, UNCONTROLLED 01/29/2009   ARM PAIN, LEFT 01/29/2009    ONSET DATE: DOS 12/16/23  REFERRING DIAG: F74.467 (ICD-10-CM) - Pain in left wrist   THERAPY DIAG:  Localized edema  Muscle weakness (generalized)  Pain in left wrist  Other lack of coordination  Stiffness of left wrist, not elsewhere classified  Rationale for Evaluation and Treatment: Rehabilitation  PERTINENT HISTORY: Approximately 49-year-old broken wrist with issues leading to surgery, no other significant issues related to this problem  her wrist fx was Feb 2025 and she had lingering wrist pain, positive ulnar variance, numbness in median nerve and fnl problems.   States numbness is much improved already as well as pain  PRECAUTIONS: None  RED FLAGS: None   WEIGHT BEARING RESTRICTIONS: Yes no significant or heavy weightbearing  through the left arm now and for the next 2 to 4 weeks   SUBJECTIVE:   SUBJECTIVE STATEMENT: Now ~5 weeks s/p [facility] of Lt distal radius ORIF; also had CTR, ulnar shortening ostomy.  She states her swelling has changed and now she needs her orthosis modified.  It is rubbing around her elbow as well.  She also states getting very hot and sweaty on her arm and would like some holes punctured through her material to help it breathe.    Pt accompanied by: daughter    PAIN:  Are you having pain? Yes: NPRS scale: 0/10 at rest now  Pain location: Lt wrist sx area Pain description: Aching and sore Aggravating factors: Aggressive motion or weightbearing Relieving factors: Rest  FALLS: Has patient  fallen in last 6 months? No   PLOF: Independent  PATIENT GOALS: Safely improve use of the left hand and wrist and arm for home and functional tasks  NEXT MD VISIT: As needed   OBJECTIVE: (All objective assessments below are from initial evaluation on: 01/16/24 unless otherwise specified.)   HAND DOMINANCE: Right   ADLs: Overall ADLs: States decreased ability to grab, hold household objects, pain and difficulty to open containers, perform FMS tasks (manipulate fasteners on clothing), mild to moderate bathing problems as well.    FUNCTIONAL OUTCOME MEASURES: 01/25/24: Patient Specific Functional Scale: 4.6 (lifting household items with 2 hands, sweeping and vacuuming, holding books) (Higher Score  =  Better Ability for the Selected Tasks)      UPPER EXTREMITY ROM     Shoulder to Wrist AROM Left eval LT 01/25/24  Elbow flexion WNL   Elbow extension WNL   Forearm supination 64 78  Forearm pronation  52 58  Wrist flexion 30 40  Wrist extension 38 39  Wrist ulnar deviation    Wrist radial deviation    (Blank rows = not tested)   Hand AROM Left eval  Full Fist Ability (or Gap to Distal Palmar Crease) Full fist  Thumb Opposition  (Kapandji Scale)  7/10  (Blank rows = not tested)   UPPER EXTREMITY MMT:    Eval:  NT at eval due to recent and still healing injuries. Will be tested when appropriate.   MMT Left TBD  Forearm supination   Forearm pronation   Wrist flexion   Wrist extension   Wrist ulnar deviation   Wrist radial deviation   (Blank rows = not tested)  HAND FUNCTION: Eval: Observed weakness in affected left hand.  Details TBD when safe Grip strength Right: TBD lbs, Left: TBD lbs   COORDINATION: Eval: Observed coordination impairments with affected left hand.  Details TBD when able 9 Hole Peg Test Left: TBD sec (TBD sec is WFL)   EDEMA:   Eval:  Mildly swollen in left hand and wrist today, compared to the right  OBSERVATIONS:   Eval: Surgical incisions  are healing up nicely now at 4 weeks.  Not overtly hypersensitive anywhere, swelling is mild and improving   Left ulnar shortening ostomy, carpal tunnel release, removal of hardware  TODAY'S TREATMENT:  01/25/24: She starts with active range of motion for exercise as well as new measures which does show improvements in the forearm wrist and hand.  We reviewed her home exercises, and OT also adds information for gentle passive range of motion stretches at the forearm and the wrist as bolded below.  Each of these are done with her to ensure correct performance and understanding.    OT  also spent some time modifying her orthosis, trimming and padding around the elbow, puncturing it with small holes to allow sweat wicking.  She states it fits much better again and has no complaints about it at the moment.  She was told to take it off more frequently for exercises, motion, even light activities less than 1 or 2 pounds.  We did review basic self-care/safety precautions, however.  She states understanding all directions today and leaves without complaints.  New exercises - elbow flexion STRETCH  - 3-4 x daily - 3-5 reps - 15 sec hold - Forearm Supination Stretch  - 3-4 x daily - 3-5 reps - 15 sec hold - Forearm Pronation Stretch  - 3-4 x daily - 3-5 reps - 15 sec hold - Wrist Flexion Stretch  - 4 x daily - 3-5 reps - 15 sec hold - Wrist Prayer Stretch  - 4 x daily - 3-5 reps - 15 sec hold   PATIENT EDUCATION: Education details: See tx section above for details  Person educated: Patient Education method: Verbal Instruction, Teach back, Handouts  Education comprehension: States and demonstrates understanding, Additional Education required    HOME EXERCISE PROGRAM: Access Code: [email or web address] URL: [email or web address] Date: 01/16/2024 Prepared by: Melvenia Ada   GOALS: Goals reviewed with patient? Yes   SHORT TERM GOALS: (STG required if POC>30 days) Target Date: 02/03/2024  Pt  will obtain protective, custom orthotic. Goal status: 01/16/2024: MET   2.  Pt will demo/state understanding of initial HEP to improve pain levels and prerequisite motion. Goal status: 01/25/2024: Goal met   LONG TERM GOALS: Target Date: 03/02/2024  Pt will improve functional ability by decreased impairment per PSFS assessment from TBD to TBD or better, for better quality of life. Goal status: INITIAL pending baseline measures  2.  Pt will improve grip strength in left hand from unsafe to test to at least 20 lbs for functional use at home and in IADLs. Goal status: INITIAL  3.  Pt will improve A/ROM in left wrist flexion/extension from 30/38 degrees respectively to at least 55 degrees each, to have functional motion for tasks like reach and grasp.  Goal status: INITIAL  4.  Pt will improve strength in left forearm supination/pronation from apparent 3 -/5 MMT to at least 4+/5 MMT to have increased functional ability to carry out selfcare and higher-level homecare tasks with less difficulty. Goal status: INITIAL  5.  Pt will improve coordination skills in left hand and arm, as seen by within functional limit score on coordination testing to have increased functional ability to carry out fine motor tasks (fasteners, etc.) and more complex, coordinated IADLs (meal prep, sports, etc.).  Goal status: INITIAL  ASSESSMENT:  CLINICAL IMPRESSION: 01/25/24: Orthosis needed some adjustment today, and she seems to be having some hot flashes/hormonal changes as she states being very hot in the middle of January, but other than that everything is on track.      PLAN:  OT FREQUENCY: 1-2x/week  OT DURATION: 6 weeks through 03/02/2024 and up to 10 total visits as needed   PLANNED INTERVENTIONS: 97535 self care/ADL training, [id number] therapeutic exercise, 97530 therapeutic activity, 97112 neuromuscular re-education, 97140 manual therapy, 97035 ultrasound, 97032 electrical stimulation (manual), 97760  Orthotic Initial, [id number] Orthotic/Prosthetic subsequent, compression bandaging, Dry needling, energy conservation, coping strategies training, and patient/family education  RECOMMENDED OTHER SERVICES: none now    CONSULTED AND AGREED WITH PLAN OF CARE: Patient  PLAN FOR NEXT SESSION:   Check new stretches, upgrad eas  possible   Melvenia Ada, OTR/L, CHT  01/25/2024, 4:03 PM   "

## 2024-01-25 ENCOUNTER — Ambulatory Visit (INDEPENDENT_AMBULATORY_CARE_PROVIDER_SITE_OTHER): Admitting: Rehabilitative and Restorative Service Providers"

## 2024-01-25 ENCOUNTER — Encounter: Payer: Self-pay | Admitting: Rehabilitative and Restorative Service Providers"

## 2024-01-25 DIAGNOSIS — R278 Other lack of coordination: Secondary | ICD-10-CM | POA: Diagnosis not present

## 2024-01-25 DIAGNOSIS — R600 Localized edema: Secondary | ICD-10-CM | POA: Diagnosis not present

## 2024-01-25 DIAGNOSIS — M25632 Stiffness of left wrist, not elsewhere classified: Secondary | ICD-10-CM

## 2024-01-25 DIAGNOSIS — M6281 Muscle weakness (generalized): Secondary | ICD-10-CM

## 2024-01-25 DIAGNOSIS — M25532 Pain in left wrist: Secondary | ICD-10-CM | POA: Diagnosis not present

## 2024-02-01 ENCOUNTER — Encounter: Admitting: Rehabilitative and Restorative Service Providers"

## 2024-02-07 ENCOUNTER — Encounter: Admitting: Rehabilitative and Restorative Service Providers"

## 2024-02-09 NOTE — Therapy (Incomplete)
 " OUTPATIENT OCCUPATIONAL THERAPY TREATMENT NOTE  Patient Name: Angela [name] MRN: [id number] DOB:Jun 20, 1945, 79 y.o., female Today's Date: 02/09/2024  PCP: Gerome BIRCH, DO  REFERRING PROVIDER: Dr. Arlinda   END OF SESSION:     Past Medical History:  Diagnosis Date   Attention deficit hyperactivity disorder (ADHD), predominantly inattentive type 07/25/2019   Cancer (HCC)    skin   Chronic pain    Chronic, continuous use of opioids 10/12/2016   Complication of anesthesia    COVID 2023   Difficult intubation    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 to very limited mouth opening   Headache    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/12/2016   Hyperprolactinemia 07/09/2019   Hypertension    Hypothyroidism    Irritable bowel syndrome 07/25/2019   Joint disorder    Mitral valve prolapse    dx'd years ago - no issues   OSA on CPAP 05/26/2020   Osteoporosis 07/09/2019   Other retention of urine 10/12/2016   Other spondylosis with radiculopathy, cervical region 11/03/2020   Pneumonia    PONV (postoperative nausea and vomiting)    Porphyria (HCC)    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)   Primary osteoarthritis, right shoulder 11/16/2022   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   Sleep apnea    no CPAP   Squamous cell carcinoma of skin 09/23/2022   Left Middle Medial Pretibial, [facility]   Squamous cell carcinoma of skin 09/23/2022   Right Lateral Knee Superior, [facility]   Thyroid  dysfunction 07/25/2019   Trochanteric bursitis, left hip 06/28/2017   Urethral caruncle 07/25/2019   Urinary urgency 09/08/2022   Vaginal atrophy 10/12/2016   Wears contact lenses    Past Surgical History:  Procedure Laterality Date   ABDOMINAL HYSTERECTOMY     Age 79   ANKLE SURGERY     x3   APPENDECTOMY     BIOPSY  10/19/2018   Procedure: BIOPSY;  Surgeon: Saintclair Jasper, MD;  Location: [facility];  Service: Gastroenterology;;   BIOPSY  04/11/2020   Procedure:  BIOPSY;  Surgeon: Saintclair Jasper, MD;  Location: [facility];  Service: Gastroenterology;;   CARPAL TUNNEL RELEASE Left 12/16/2023   Procedure: CARPAL TUNNEL RELEASE;  Surgeon: Arlinda Buster, MD;  Location: [facility];  Service: Orthopedics;  Laterality: Left;  LEFT WRIST REMOVAL OF HARDWAR/ OPEN CARPAL TUNNEL RELEASE WITH HYPOTHENAR FAT FLAP/ ULNAR SHORTENING OSTEOTOMY   COLONOSCOPY WITH PROPOFOL  N/A 10/19/2018   Procedure: COLONOSCOPY WITH PROPOFOL ;  Surgeon: Saintclair Jasper, MD;  Location: [facility];  Service: Gastroenterology;  Laterality: N/A;   COLONOSCOPY WITH PROPOFOL  N/A 04/11/2020   Procedure: COLONOSCOPY WITH PROPOFOL ;  Surgeon: Saintclair Jasper, MD;  Location: [facility];  Service: Gastroenterology;  Laterality: N/A;  (difficulty airway)    DILATION AND CURETTAGE OF UTERUS     x 4   ESOPHAGOGASTRODUODENOSCOPY (EGD) WITH PROPOFOL  N/A 10/19/2018   Procedure: ESOPHAGOGASTRODUODENOSCOPY (EGD) WITH PROPOFOL ;  Surgeon: Saintclair Jasper, MD;  Location: [facility];  Service: Gastroenterology;  Laterality: N/A;   FRACTURE SURGERY     jaw   HARDWARE REMOVAL Left 12/16/2023   Procedure: REMOVAL, HARDWARE;  Surgeon: Arlinda Buster, MD;  Location: [facility];  Service: Orthopedics;  Laterality: Left;   KNEE SURGERY     x 3   KYPHOPLASTY N/A 08/07/2019   Procedure: Thoracic twelve Vertebral Augmentation with Stenting/SpineJack;  Surgeon: Debby Dorn MATSU, MD;  Location: [facility];  Service: Neurosurgery;  Laterality: N/A;   MANDIBLE FRACTURE SURGERY  x 19   ORIF WRIST FRACTURE Left 03/16/2023   Procedure: OPEN REDUCTION INTERNAL FIXATION OF LEFT WRIST.;  Surgeon: Edie Norleen PARAS, MD;  Location: [facility];  Service: Orthopedics;  Laterality: Left;   POLYPECTOMY  10/19/2018   Procedure: POLYPECTOMY;  Surgeon: Saintclair Jasper, MD;  Location: [facility];  Service: Gastroenterology;;   POLYPECTOMY  04/11/2020   Procedure: POLYPECTOMY;  Surgeon: Saintclair Jasper, MD;  Location: [facility];   Service: Gastroenterology;;   ULNA OSTEOTOMY Left 12/16/2023   Procedure: TANDA MORITA;  Surgeon: Arlinda Buster, MD;  Location: [facility];  Service: Orthopedics;  Laterality: Left;   Patient Active Problem List   Diagnosis Date Noted   Retained orthopedic hardware 12/16/2023   Ulnar impaction syndrome, left 12/16/2023   Carpal tunnel syndrome, left upper limb 12/16/2023   History of radiation therapy 06/09/2023   History of endocrine disorder 06/09/2023   History of adenomatous polyp of colon 06/09/2023   Hashimoto's thyroiditis 06/09/2023   Chronic venous insufficiency 06/09/2023   Porphyria cutanea tarda (HCC) 06/09/2023   Vitamin D deficiency 06/09/2023   Closed displaced fracture of styloid process of left ulna 03/17/2023   Closed Colles' fracture of left radius 03/17/2023   Primary osteoarthritis, right shoulder 11/16/2022   Right arm pain 09/08/2022   Urinary urgency 09/08/2022   Weakness 09/08/2022   Other spondylosis with radiculopathy, cervical region 11/03/2020   Hypothyroidism due to Hashimoto's thyroiditis 05/28/2020   Low serum cortisol level 05/28/2020   OSA on CPAP 05/26/2020   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   Attention deficit hyperactivity disorder, predominantly inattentive type 07/25/2019   Chronic pain syndrome 07/25/2019   Irritable bowel syndrome 07/25/2019   Urethral caruncle 07/25/2019   Thyroid  dysfunction 07/25/2019   Osteoporosis 07/09/2019   Hyperprolactinemia 07/09/2019   Trochanteric bursitis, left hip 06/28/2017   Chronic, continuous use of opioids 10/12/2016   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/12/2016   Other retention of urine 10/12/2016   Vaginal atrophy 10/12/2016   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  Rationale for Evaluation and Treatment: Rehabilitation  PERTINENT HISTORY: Approximately 74-year-old broken wrist with issues leading to surgery, no other significant issues related to this problem  her wrist fx was Feb 2025 and she had lingering wrist pain, positive ulnar variance, numbness in median nerve and fnl problems.   States numbness is much improved already as well as pain  PRECAUTIONS: None  RED FLAGS: None   WEIGHT BEARING RESTRICTIONS: Yes no significant or heavy weightbearing through the left arm now and for the next 2 to 4 weeks   SUBJECTIVE:   SUBJECTIVE STATEMENT: Now ~8 weeks s/p ROH of Lt distal radius ORIF; also had CTR, ulnar shortening ostomy.  She states ***    her swelling has changed and now she needs her orthosis modified.  It is rubbing around her elbow as well.  She also states getting very hot and sweaty on her arm and would like some holes punctured through her material to help it breathe.    Pt accompanied by: daughter    PAIN:  Are you having pain? Yes: NPRS scale: *** 0/10 at rest now  Pain location: Lt wrist sx area Pain description: Aching and sore  Aggravating factors: Aggressive motion or weightbearing Relieving factors: Rest  FALLS: Has patient fallen in last 6 months? No   PLOF: Independent  PATIENT GOALS: Safely improve use of the left hand and wrist and arm for home and functional tasks  NEXT MD VISIT: As needed   OBJECTIVE: (All objective assessments below are from initial evaluation on: 01/16/24 unless otherwise specified.)   HAND DOMINANCE: Right   ADLs: Overall ADLs: States decreased ability to grab, hold household objects, pain and difficulty to open containers, perform FMS tasks (manipulate fasteners on clothing), mild to moderate bathing problems as well.    FUNCTIONAL OUTCOME MEASURES: 01/25/24: Patient Specific  Functional Scale: 4.6 (lifting household items with 2 hands, sweeping and vacuuming, holding books) (Higher Score  =  Better Ability for the Selected Tasks)      UPPER EXTREMITY ROM     Shoulder to Wrist AROM Left eval LT 01/25/24 Lt 02/13/24  Elbow flexion WNL    Elbow extension WNL    Forearm supination 64 78 ***  Forearm pronation  52 58 ***  Wrist flexion 30 40 ***  Wrist extension 38 39 ***  Wrist ulnar deviation     Wrist radial deviation     (Blank rows = not tested)   Hand AROM Left eval  Full Fist Ability (or Gap to Distal Palmar Crease) Full fist  Thumb Opposition  (Kapandji Scale)  7/10  (Blank rows = not tested)   UPPER EXTREMITY MMT:    Eval:  NT at eval due to recent and still healing injuries. Will be tested when appropriate.   MMT Left TBD  Forearm supination   Forearm pronation   Wrist flexion   Wrist extension   Wrist ulnar deviation   Wrist radial deviation   (Blank rows = not tested)  HAND FUNCTION: Eval: Observed weakness in affected left hand.  Details TBD when safe Grip strength Right: TBD lbs, Left: TBD lbs   COORDINATION: Eval: Observed coordination impairments with affected left hand.  Details TBD when able 9 Hole Peg Test Left: TBD sec (TBD sec is WFL)   EDEMA:   Eval:  Mildly swollen in left hand and wrist today, compared to the right  OBSERVATIONS:   Eval: Surgical incisions are healing up nicely now at 4 weeks.  Not overtly hypersensitive anywhere, swelling is mild and improving   Left ulnar shortening ostomy, carpal tunnel release, removal of hardware  TODAY'S TREATMENT:  02/13/24: *** Check new stretches, upgrad eas possible     01/25/24: She starts with active range of motion for exercise as well as new measures which does show improvements in the forearm wrist and hand.  We reviewed her home exercises, and OT also adds information for gentle passive range of motion stretches at the forearm and the wrist as bolded below.  Each  of these are done with her to ensure correct performance and understanding.    OT also spent some time modifying her orthosis, trimming and padding around the elbow, puncturing it with small holes to allow sweat wicking.  She states it fits much better again and has no complaints about it at the moment.  She was told to take it off more frequently for exercises, motion, even light activities less than 1 or 2 pounds.  We did review basic self-care/safety precautions, however.  She states understanding all directions today and leaves without complaints.  New exercises - elbow flexion STRETCH  - 3-4 x daily - 3-5 reps - 15  sec hold - Forearm Supination Stretch  - 3-4 x daily - 3-5 reps - 15 sec hold - Forearm Pronation Stretch  - 3-4 x daily - 3-5 reps - 15 sec hold - Wrist Flexion Stretch  - 4 x daily - 3-5 reps - 15 sec hold - Wrist Prayer Stretch  - 4 x daily - 3-5 reps - 15 sec hold   PATIENT EDUCATION: Education details: See tx section above for details  Person educated: Patient Education method: Verbal Instruction, Teach back, Handouts  Education comprehension: States and demonstrates understanding, Additional Education required    HOME EXERCISE PROGRAM: Access Code: [email or web address] URL: [email or web address] Date: 01/16/2024 Prepared by: Melvenia Ada   GOALS: Goals reviewed with patient? Yes   SHORT TERM GOALS: (STG required if POC>30 days) Target Date: 02/03/2024  Pt will obtain protective, custom orthotic. Goal status: 01/16/2024: MET   2.  Pt will demo/state understanding of initial HEP to improve pain levels and prerequisite motion. Goal status: 01/25/2024: Goal met   LONG TERM GOALS: Target Date: 03/02/2024  Pt will improve functional ability by decreased impairment per PSFS assessment from TBD to TBD or better, for better quality of life. Goal status: INITIAL pending baseline measures  2.  Pt will improve grip strength in left hand from unsafe to test to  at least 20 lbs for functional use at home and in IADLs. Goal status: INITIAL  3.  Pt will improve A/ROM in left wrist flexion/extension from 30/38 degrees respectively to at least 55 degrees each, to have functional motion for tasks like reach and grasp.  Goal status: INITIAL  4.  Pt will improve strength in left forearm supination/pronation from apparent 3 -/5 MMT to at least 4+/5 MMT to have increased functional ability to carry out selfcare and higher-level homecare tasks with less difficulty. Goal status: INITIAL  5.  Pt will improve coordination skills in left hand and arm, as seen by within functional limit score on coordination testing to have increased functional ability to carry out fine motor tasks (fasteners, etc.) and more complex, coordinated IADLs (meal prep, sports, etc.).  Goal status: INITIAL  ASSESSMENT:  CLINICAL IMPRESSION: 02/13/24: ***   01/25/24: Orthosis needed some adjustment today, and she seems to be having some hot flashes/hormonal changes as she states being very hot in the middle of January, but other than that everything is on track.      PLAN:  OT FREQUENCY: 1-2x/week  OT DURATION: 6 weeks through 03/02/2024 and up to 10 total visits as needed   PLANNED INTERVENTIONS: 97535 self care/ADL training, 02889 therapeutic exercise, 97530 therapeutic activity, 97112 neuromuscular re-education, 97140 manual therapy, 97035 ultrasound, 97032 electrical stimulation (manual), 97760 Orthotic Initial, [id number] Orthotic/Prosthetic subsequent, compression bandaging, Dry needling, energy conservation, coping strategies training, and patient/family education  RECOMMENDED OTHER SERVICES: none now    CONSULTED AND AGREED WITH PLAN OF CARE: Patient  PLAN FOR NEXT SESSION:   ***  Melvenia Ada, OTR/L, CHT  02/09/2024, 5:01 PM   "

## 2024-02-10 ENCOUNTER — Telehealth: Payer: Self-pay | Admitting: Orthopedic Surgery

## 2024-02-10 NOTE — Telephone Encounter (Signed)
 Pt request to r/s her appts for 02/13/24, please call

## 2024-02-13 ENCOUNTER — Encounter: Admitting: Orthopedic Surgery

## 2024-02-13 ENCOUNTER — Encounter: Admitting: Rehabilitative and Restorative Service Providers"

## 2024-02-14 ENCOUNTER — Ambulatory Visit: Admitting: Occupational Therapy

## 2024-02-14 DIAGNOSIS — M25632 Stiffness of left wrist, not elsewhere classified: Secondary | ICD-10-CM

## 2024-02-14 DIAGNOSIS — M25532 Pain in left wrist: Secondary | ICD-10-CM

## 2024-02-14 DIAGNOSIS — R278 Other lack of coordination: Secondary | ICD-10-CM

## 2024-02-14 DIAGNOSIS — M6281 Muscle weakness (generalized): Secondary | ICD-10-CM

## 2024-02-14 DIAGNOSIS — R600 Localized edema: Secondary | ICD-10-CM

## 2024-02-16 ENCOUNTER — Ambulatory Visit: Admitting: Orthopedic Surgery

## 2024-02-16 ENCOUNTER — Other Ambulatory Visit: Payer: Self-pay

## 2024-02-16 DIAGNOSIS — Z9889 Other specified postprocedural states: Secondary | ICD-10-CM

## 2024-02-16 NOTE — Progress Notes (Unsigned)
" ° °  Angela [name] - 79 y.o. female MRN [id number]  Date of birth: 1945-10-06  Office Visit Note: Visit Date: 02/16/2024 PCP: Gerome Brunet, DO Referred by: Gerome Brunet, DO  Subjective:  HPI: Angela [name] is a 79 y.o. female who presents today for follow up 8 weeks status post left wrist hardware removal, deep implant. Left wrist open extended carpal tunnel release with hypothenar fat flap. Left wrist ulnar shortening osteotomy. .  Pertinent ROS were reviewed with the patient and found to be negative unless otherwise specified above in HPI.   Assessment & Plan: Visit Diagnoses: No diagnosis found.  Plan: ***  Follow-up: No follow-ups on file.   Meds & Orders: No orders of the defined types were placed in this encounter.  No orders of the defined types were placed in this encounter.    Procedures: No procedures performed       Objective:   Vital Signs: There were no vitals taken for this visit.  Ortho Exam ***  Imaging: No results found.   Anshul Afton Alderton, M.D. [facility], Hand Surgery  "

## 2024-02-20 ENCOUNTER — Ambulatory Visit: Admitting: Occupational Therapy

## 2024-02-24 ENCOUNTER — Ambulatory Visit: Admitting: Occupational Therapy

## 2024-03-19 ENCOUNTER — Encounter: Admitting: Orthopedic Surgery
